# Patient Record
Sex: Male | Born: 1975
Health system: Southern US, Community
[De-identification: ages and names within clinical notes are randomized; demographics above are authoritative.]

## PROBLEM LIST (undated history)

## (undated) DIAGNOSIS — K449 Diaphragmatic hernia without obstruction or gangrene: Secondary | ICD-10-CM

## (undated) DIAGNOSIS — J45909 Unspecified asthma, uncomplicated: Secondary | ICD-10-CM

## (undated) DIAGNOSIS — L509 Urticaria, unspecified: Secondary | ICD-10-CM

## (undated) DIAGNOSIS — G43909 Migraine, unspecified, not intractable, without status migrainosus: Secondary | ICD-10-CM

## (undated) DIAGNOSIS — G4733 Obstructive sleep apnea (adult) (pediatric): Secondary | ICD-10-CM

## (undated) DIAGNOSIS — E559 Vitamin D deficiency, unspecified: Secondary | ICD-10-CM

## (undated) DIAGNOSIS — M2550 Pain in unspecified joint: Secondary | ICD-10-CM

## (undated) DIAGNOSIS — K582 Mixed irritable bowel syndrome: Secondary | ICD-10-CM

## (undated) DIAGNOSIS — E785 Hyperlipidemia, unspecified: Secondary | ICD-10-CM

## (undated) DIAGNOSIS — G4730 Sleep apnea, unspecified: Secondary | ICD-10-CM

## (undated) DIAGNOSIS — R7303 Prediabetes: Secondary | ICD-10-CM

## (undated) DIAGNOSIS — R002 Palpitations: Secondary | ICD-10-CM

## (undated) DIAGNOSIS — E669 Obesity, unspecified: Secondary | ICD-10-CM

## (undated) DIAGNOSIS — I10 Essential (primary) hypertension: Secondary | ICD-10-CM

## (undated) DIAGNOSIS — K5900 Constipation, unspecified: Secondary | ICD-10-CM

## (undated) DIAGNOSIS — M7989 Other specified soft tissue disorders: Secondary | ICD-10-CM

## (undated) DIAGNOSIS — F419 Anxiety disorder, unspecified: Secondary | ICD-10-CM

## (undated) DIAGNOSIS — K219 Gastro-esophageal reflux disease without esophagitis: Secondary | ICD-10-CM

## (undated) DIAGNOSIS — T7840XA Allergy, unspecified, initial encounter: Secondary | ICD-10-CM

## (undated) HISTORY — DX: Gastro-esophageal reflux disease without esophagitis: K21.9

## (undated) HISTORY — DX: Sleep apnea, unspecified: G47.30

## (undated) HISTORY — DX: Mixed irritable bowel syndrome: K58.2

## (undated) HISTORY — DX: Obstructive sleep apnea (adult) (pediatric): G47.33

## (undated) HISTORY — DX: Urticaria, unspecified: L50.9

## (undated) HISTORY — DX: Diaphragmatic hernia without obstruction or gangrene: K44.9

## (undated) HISTORY — DX: Obesity, unspecified: E66.9

## (undated) HISTORY — DX: Pain in unspecified joint: M25.50

## (undated) HISTORY — DX: Anxiety disorder, unspecified: F41.9

## (undated) HISTORY — DX: Allergy, unspecified, initial encounter: T78.40XA

## (undated) HISTORY — DX: Migraine, unspecified, not intractable, without status migrainosus: G43.909

## (undated) HISTORY — DX: Hyperlipidemia, unspecified: E78.5

## (undated) HISTORY — DX: Palpitations: R00.2

## (undated) HISTORY — PX: URETHRAL DILATION: SUR417

## (undated) HISTORY — DX: Constipation, unspecified: K59.00

## (undated) HISTORY — DX: Vitamin D deficiency, unspecified: E55.9

## (undated) HISTORY — DX: Other specified soft tissue disorders: M79.89

## (undated) HISTORY — DX: Unspecified asthma, uncomplicated: J45.909

## (undated) HISTORY — DX: Essential (primary) hypertension: I10

## (undated) HISTORY — DX: Prediabetes: R73.03

---

## 1998-02-20 ENCOUNTER — Emergency Department (HOSPITAL_COMMUNITY): Admission: EM | Admit: 1998-02-20 | Discharge: 1998-02-20 | Payer: Self-pay | Admitting: Internal Medicine

## 1998-10-16 ENCOUNTER — Encounter: Payer: Self-pay | Admitting: Emergency Medicine

## 1998-10-16 ENCOUNTER — Emergency Department (HOSPITAL_COMMUNITY): Admission: EM | Admit: 1998-10-16 | Discharge: 1998-10-16 | Payer: Self-pay | Admitting: Emergency Medicine

## 1999-07-26 ENCOUNTER — Emergency Department (HOSPITAL_COMMUNITY): Admission: EM | Admit: 1999-07-26 | Discharge: 1999-07-26 | Payer: Self-pay | Admitting: Emergency Medicine

## 1999-07-26 ENCOUNTER — Encounter: Payer: Self-pay | Admitting: Emergency Medicine

## 1999-08-13 ENCOUNTER — Ambulatory Visit (HOSPITAL_COMMUNITY): Admission: RE | Admit: 1999-08-13 | Discharge: 1999-08-13 | Payer: Self-pay | Admitting: Urology

## 2000-03-26 ENCOUNTER — Emergency Department (HOSPITAL_COMMUNITY): Admission: EM | Admit: 2000-03-26 | Discharge: 2000-03-26 | Payer: Self-pay | Admitting: Emergency Medicine

## 2000-03-27 ENCOUNTER — Encounter: Payer: Self-pay | Admitting: Emergency Medicine

## 2000-04-06 ENCOUNTER — Encounter: Payer: Self-pay | Admitting: Gastroenterology

## 2000-04-06 ENCOUNTER — Ambulatory Visit (HOSPITAL_COMMUNITY): Admission: RE | Admit: 2000-04-06 | Discharge: 2000-04-06 | Payer: Self-pay | Admitting: Gastroenterology

## 2000-04-08 ENCOUNTER — Encounter (INDEPENDENT_AMBULATORY_CARE_PROVIDER_SITE_OTHER): Payer: Self-pay | Admitting: Specialist

## 2000-04-08 ENCOUNTER — Ambulatory Visit (HOSPITAL_COMMUNITY): Admission: RE | Admit: 2000-04-08 | Discharge: 2000-04-08 | Payer: Self-pay | Admitting: Gastroenterology

## 2000-04-08 ENCOUNTER — Encounter: Payer: Self-pay | Admitting: Gastroenterology

## 2000-04-22 ENCOUNTER — Ambulatory Visit (HOSPITAL_COMMUNITY): Admission: RE | Admit: 2000-04-22 | Discharge: 2000-04-22 | Payer: Self-pay | Admitting: Oncology

## 2000-04-22 ENCOUNTER — Encounter: Payer: Self-pay | Admitting: Oncology

## 2001-10-13 ENCOUNTER — Encounter: Admission: RE | Admit: 2001-10-13 | Discharge: 2001-10-13 | Payer: Self-pay | Admitting: Family Medicine

## 2001-11-25 ENCOUNTER — Encounter: Payer: Self-pay | Admitting: *Deleted

## 2001-11-25 ENCOUNTER — Encounter: Admission: RE | Admit: 2001-11-25 | Discharge: 2001-11-25 | Payer: Self-pay | Admitting: *Deleted

## 2001-11-25 ENCOUNTER — Encounter: Admission: RE | Admit: 2001-11-25 | Discharge: 2001-11-25 | Payer: Self-pay | Admitting: Family Medicine

## 2001-12-02 ENCOUNTER — Encounter: Admission: RE | Admit: 2001-12-02 | Discharge: 2001-12-02 | Payer: Self-pay | Admitting: Family Medicine

## 2002-01-16 ENCOUNTER — Encounter: Admission: RE | Admit: 2002-01-16 | Discharge: 2002-01-16 | Payer: Self-pay | Admitting: Family Medicine

## 2002-03-15 ENCOUNTER — Encounter: Admission: RE | Admit: 2002-03-15 | Discharge: 2002-03-15 | Payer: Self-pay | Admitting: Family Medicine

## 2003-04-10 ENCOUNTER — Encounter: Payer: Self-pay | Admitting: Emergency Medicine

## 2003-04-10 ENCOUNTER — Emergency Department (HOSPITAL_COMMUNITY): Admission: EM | Admit: 2003-04-10 | Discharge: 2003-04-10 | Payer: Self-pay | Admitting: Emergency Medicine

## 2003-08-16 ENCOUNTER — Encounter: Admission: RE | Admit: 2003-08-16 | Discharge: 2003-08-16 | Payer: Self-pay | Admitting: Geriatric Medicine

## 2003-11-09 ENCOUNTER — Ambulatory Visit (HOSPITAL_COMMUNITY): Admission: RE | Admit: 2003-11-09 | Discharge: 2003-11-09 | Payer: Self-pay | Admitting: Internal Medicine

## 2004-12-23 ENCOUNTER — Emergency Department (HOSPITAL_COMMUNITY): Admission: EM | Admit: 2004-12-23 | Discharge: 2004-12-23 | Payer: Self-pay | Admitting: Family Medicine

## 2005-02-04 ENCOUNTER — Emergency Department (HOSPITAL_COMMUNITY): Admission: EM | Admit: 2005-02-04 | Discharge: 2005-02-04 | Payer: Self-pay | Admitting: Family Medicine

## 2005-07-22 ENCOUNTER — Ambulatory Visit: Payer: Self-pay | Admitting: Internal Medicine

## 2005-08-27 ENCOUNTER — Ambulatory Visit: Payer: Self-pay | Admitting: Internal Medicine

## 2006-01-19 ENCOUNTER — Ambulatory Visit: Payer: Self-pay | Admitting: Internal Medicine

## 2006-07-06 ENCOUNTER — Emergency Department (HOSPITAL_COMMUNITY): Admission: EM | Admit: 2006-07-06 | Discharge: 2006-07-06 | Payer: Self-pay | Admitting: Family Medicine

## 2007-03-08 ENCOUNTER — Emergency Department (HOSPITAL_COMMUNITY): Admission: EM | Admit: 2007-03-08 | Discharge: 2007-03-08 | Payer: Self-pay | Admitting: Family Medicine

## 2007-09-28 ENCOUNTER — Emergency Department (HOSPITAL_COMMUNITY): Admission: EM | Admit: 2007-09-28 | Discharge: 2007-09-28 | Payer: Self-pay | Admitting: Emergency Medicine

## 2008-03-05 DIAGNOSIS — K5289 Other specified noninfective gastroenteritis and colitis: Secondary | ICD-10-CM | POA: Insufficient documentation

## 2008-03-06 ENCOUNTER — Ambulatory Visit: Payer: Self-pay | Admitting: Internal Medicine

## 2008-03-07 ENCOUNTER — Telehealth (INDEPENDENT_AMBULATORY_CARE_PROVIDER_SITE_OTHER): Payer: Self-pay | Admitting: *Deleted

## 2008-03-11 DIAGNOSIS — E785 Hyperlipidemia, unspecified: Secondary | ICD-10-CM

## 2008-03-11 DIAGNOSIS — J45909 Unspecified asthma, uncomplicated: Secondary | ICD-10-CM

## 2008-03-11 HISTORY — DX: Unspecified asthma, uncomplicated: J45.909

## 2008-03-11 HISTORY — DX: Hyperlipidemia, unspecified: E78.5

## 2008-04-05 ENCOUNTER — Ambulatory Visit: Payer: Self-pay | Admitting: Internal Medicine

## 2008-04-06 ENCOUNTER — Telehealth (INDEPENDENT_AMBULATORY_CARE_PROVIDER_SITE_OTHER): Payer: Self-pay | Admitting: *Deleted

## 2008-08-21 ENCOUNTER — Ambulatory Visit: Payer: Self-pay | Admitting: Internal Medicine

## 2008-10-22 ENCOUNTER — Telehealth: Payer: Self-pay | Admitting: Internal Medicine

## 2008-11-19 ENCOUNTER — Ambulatory Visit: Payer: Self-pay | Admitting: Internal Medicine

## 2008-11-19 DIAGNOSIS — G4733 Obstructive sleep apnea (adult) (pediatric): Secondary | ICD-10-CM

## 2008-11-19 HISTORY — DX: Obstructive sleep apnea (adult) (pediatric): G47.33

## 2008-12-14 ENCOUNTER — Ambulatory Visit (HOSPITAL_BASED_OUTPATIENT_CLINIC_OR_DEPARTMENT_OTHER): Admission: RE | Admit: 2008-12-14 | Discharge: 2008-12-14 | Payer: Self-pay | Admitting: Internal Medicine

## 2008-12-14 ENCOUNTER — Encounter: Payer: Self-pay | Admitting: Internal Medicine

## 2008-12-22 ENCOUNTER — Ambulatory Visit: Payer: Self-pay | Admitting: Internal Medicine

## 2008-12-24 ENCOUNTER — Ambulatory Visit: Payer: Self-pay | Admitting: Internal Medicine

## 2008-12-28 ENCOUNTER — Encounter: Payer: Self-pay | Admitting: Internal Medicine

## 2009-01-09 ENCOUNTER — Telehealth: Payer: Self-pay | Admitting: Internal Medicine

## 2009-03-15 ENCOUNTER — Ambulatory Visit: Payer: Self-pay | Admitting: Internal Medicine

## 2009-07-04 ENCOUNTER — Telehealth (INDEPENDENT_AMBULATORY_CARE_PROVIDER_SITE_OTHER): Payer: Self-pay | Admitting: *Deleted

## 2009-07-05 ENCOUNTER — Emergency Department (HOSPITAL_COMMUNITY): Admission: EM | Admit: 2009-07-05 | Discharge: 2009-07-05 | Payer: Self-pay | Admitting: Family Medicine

## 2009-07-25 ENCOUNTER — Ambulatory Visit: Payer: Self-pay | Admitting: Internal Medicine

## 2009-07-25 DIAGNOSIS — R5381 Other malaise: Secondary | ICD-10-CM | POA: Insufficient documentation

## 2009-07-25 DIAGNOSIS — R5383 Other fatigue: Secondary | ICD-10-CM

## 2009-07-25 DIAGNOSIS — R42 Dizziness and giddiness: Secondary | ICD-10-CM | POA: Insufficient documentation

## 2009-08-03 LAB — CONVERTED CEMR LAB
ALT: 39 units/L (ref 0–53)
AST: 25 units/L (ref 0–37)
BUN: 7 mg/dL (ref 6–23)
Basophils Absolute: 0 10*3/uL (ref 0.0–0.1)
Basophils Relative: 0.5 % (ref 0.0–3.0)
CO2: 30 meq/L (ref 19–32)
Calcium: 9.5 mg/dL (ref 8.4–10.5)
Chloride: 101 meq/L (ref 96–112)
Creatinine, Ser: 0.9 mg/dL (ref 0.4–1.5)
Eosinophils Absolute: 0.1 10*3/uL (ref 0.0–0.7)
Eosinophils Relative: 1.3 % (ref 0.0–5.0)
Free T4: 0.7 ng/dL (ref 0.6–1.6)
GFR calc non Af Amer: 101.76 mL/min (ref >60)
Glucose, Bld: 85 mg/dL (ref 70–99)
HCT: 45.5 % (ref 39.0–52.0)
Hemoglobin: 15.3 g/dL (ref 13.0–17.0)
Lymphocytes Relative: 32.6 % (ref 12.0–46.0)
Lymphs Abs: 2.1 10*3/uL (ref 0.7–4.0)
MCHC: 33.7 g/dL (ref 30.0–36.0)
MCV: 83 fL (ref 78.0–100.0)
Monocytes Absolute: 0.6 10*3/uL (ref 0.1–1.0)
Monocytes Relative: 9.7 % (ref 3.0–12.0)
Neutro Abs: 3.7 10*3/uL (ref 1.4–7.7)
Neutrophils Relative %: 55.9 % (ref 43.0–77.0)
Platelets: 252 10*3/uL (ref 150.0–400.0)
Potassium: 4.1 meq/L (ref 3.5–5.1)
RBC: 5.48 M/uL (ref 4.22–5.81)
RDW: 13.5 % (ref 11.5–14.6)
Sodium: 140 meq/L (ref 135–145)
TSH: 1.69 microintl units/mL (ref 0.35–5.50)
WBC: 6.5 10*3/uL (ref 4.5–10.5)

## 2009-09-26 ENCOUNTER — Ambulatory Visit (HOSPITAL_COMMUNITY): Admission: RE | Admit: 2009-09-26 | Discharge: 2009-09-26 | Payer: Self-pay | Admitting: Orthopaedic Surgery

## 2009-10-11 ENCOUNTER — Ambulatory Visit: Payer: Self-pay | Admitting: Internal Medicine

## 2009-11-18 ENCOUNTER — Telehealth: Payer: Self-pay | Admitting: Internal Medicine

## 2010-03-31 ENCOUNTER — Telehealth: Payer: Self-pay | Admitting: Internal Medicine

## 2010-07-11 ENCOUNTER — Telehealth (INDEPENDENT_AMBULATORY_CARE_PROVIDER_SITE_OTHER): Payer: Self-pay | Admitting: *Deleted

## 2010-10-10 ENCOUNTER — Ambulatory Visit: Admit: 2010-10-10 | Payer: Self-pay | Admitting: Internal Medicine

## 2010-10-14 NOTE — Assessment & Plan Note (Signed)
Summary: 6 monthsd/apc   Primary Provider/Referring Provider:  Rockie Neighbours  CC:  6 month follow up visit-eyes are dry and burning; nasal drainage-bloody x 2 weeks.Marland Kitchen  History of Present Illness: 11/19/08- Asthmatic bronchitis, allergic rhinitis, GERD                     Wife here For past 4 days has had sore throat, nasal drainage, clear mucus. dry cough. Denies earache, headache, fever sweats or chills, nausea or diarrhea. Woke with some sinus infection, sore throat. Wife says he still keeps a throat clearing cough. He feels current meds are good enough, using rescue inhaler about 2x/week.  She notes he will toss and turn at night without much snoring. She bumps him to see if he is breathing. He admits tired in day and feels unrested.  Asthmatic bronchitis, allergic rhinitis, OSA  Reviewed his sleep study with him. NPSG confirmed moderate OSA 20.8/hr. CPAP titration was complicated by apparent nasal congestion at higher pressures. We discussed cpap and compared alternative treatments.  03/15/09- Asthmatic bronchitis, allergic rhinitis, GERD CPAP is set at 12.- Advanced- full face mask. He feels better witout problems, and wife tells him he is sleeping a lot better. Allergy/ nasal congestion is much better with passing of Spring, but also after his work area was renovated and carpet replaced with tile.  October 11, 2009- Asthmatic bronchitis, allergic rhjinitis, GERD For 2 weeks eyes burning and watering. Nasal discharge  a little blood but no headache, fever or sore throat. Ears ok. he is able to remain compliant with CPAP, using it every night. Chest feels better, with no wheeze or cough. Used albuterol twice this month.     Current Medications (verified): 1)  Nexium 40 Mg  Cpdr (Esomeprazole Magnesium) .Marland Kitchen.. 1 Daily Before Meal 2)  Advair Diskus 250-50 Mcg/dose Misc (Fluticasone-Salmeterol) .Marland Kitchen.. 1 Puff and Rinse, Twice Daily 3)  Proventil Hfa 108 (90 Base) Mcg/act Aers (Albuterol Sulfate) ....  2 Puffs Four Times A Day As Needed 4)  Fluticasone Propionate 50 Mcg/act Susp (Fluticasone Propionate) .Marland Kitchen.. 1-2 Nsprays Each Nostril Daily 5)  Cpap 12  Advanced 6)  Lodrane 24d 12-90 Mg Xr24h-Cap (Brompheniramine-Pseudoeph) .... Take 1 or 2 Tablets By Mouth Once Daily 7)  Meclizine Hcl 25 Mg Tabs (Meclizine Hcl) .Marland Kitchen.. 1 Q 6-8 Hrs As Needed Dizziness 8)  Benadryl 25 Mg Tabs (Diphenhydramine Hcl) .... Take 2 By Mouth At Bedtime For Allergies and Sleep  Allergies (verified): No Known Drug Allergies  Past History:  Past Medical History: Last updated: 12/24/2008 Allergic Rhinitis Asthma urticaria Hyperlipidemia Hypertension Obstrucitve Sleep Apnea-  12/14/08- AHI 20.8/ hr; CPAP 12/ AHI 0  Past Surgical History: Last updated: 11/19/2008 Dilatation of urethral stricture  Family History: Last updated: 03/06/2008 Family History Asthma, cancer, allergies  Social History: Last updated: 03/06/2008 Patient never smoked.  Medical records married  Risk Factors: Smoking Status: never (03/06/2008)  Review of Systems      See HPI  The patient denies anorexia, fever, weight loss, weight gain, vision loss, decreased hearing, hoarseness, chest pain, syncope, dyspnea on exertion, peripheral edema, prolonged cough, headaches, hemoptysis, and severe indigestion/heartburn.    Vital Signs:  Patient profile:   35 year old male Height:      70 inches Weight:      222.38 pounds BMI:     32.02 O2 Sat:      97 % on Room air Pulse rate:   96 / minute BP sitting:   142 /  90  (left arm) Cuff size:   regular  Vitals Entered By: Reynaldo Minium CMA (October 11, 2009 1:39 PM)  O2 Flow:  Room air  Physical Exam  Additional Exam:  General: A/Ox3; pleasant and cooperative, NAD, overweight, comfortable appearing SKIN: no rash, lesions NODES: no lymphadenopathy HEENT: [place]/AT, EOM- WNL, Conjuctivae- clear, PERRLA, mild periorbital edema, TM-WNL, Nose- mild turbinate edema, Throat- clear and wnl,  Melampatti III, prominent tonsils NECK: Supple w/ fair ROM, JVD- none, normal carotid impulses w/o bruits Thyroid- normal to palpation CHEST: Clear to P&A HEART: RRR, no m/g/r heard ABDOMEN: Soft and nl; ZOX:WRUE, nl pulses, no edema  NEURO: Grossly intact to observation      Impression & Recommendations:  Problem # 1:  SLEEP APNEA (ICD-780.57)  Good compliance and control. He will continue CPAP. Weight loss would help.  Problem # 2:  ALLERGIC RHINITIS (ICD-477.9)  Mild rhinoconjunctivitis. We discussed potential overdrying in winter heat and the effect of nasal steroids. We will try otc eye drops. His updated medication list for this problem includes:    Fluticasone Propionate 50 Mcg/act Susp (Fluticasone propionate) .Marland Kitchen... 1-2 nsprays each nostril daily    Benadryl 25 Mg Tabs (Diphenhydramine hcl) .Marland Kitchen... Take 2 by mouth at bedtime for allergies and sleep  Medications Added to Medication List This Visit: 1)  Benadryl 25 Mg Tabs (Diphenhydramine hcl) .... Take 2 by mouth at bedtime for allergies and sleep  Other Orders: Est. Patient Level II (45409)  Patient Instructions: 1)  Schedule return in one year, earlier if needed 2)  Try otc eye drops- either Visine AC or Naphcon-A. 3)  continue CPAP at 12

## 2010-10-14 NOTE — Progress Notes (Signed)
Summary: tight chest/ cough     Phone Note Call from Patient   Caller: Patient Call For: Falecia Vannatter Summary of Call: pt c/o tight chest w/ non-productive cough x 3 days. has been taking OTC advill cold as well as vick's vapo rub. denies fever. no N or V. mose cone out pt pharm. pt # [phone]    Initial call taken by: Tivis Ringer, CNA,  November 18, 2009 10:12 AM  Follow-up for Phone Call        Pt c/o chest tightness and non prod cough for 3 days.  Pt taking Advair two times a day and proventil as needed .  Denies fever.  Chest feels like it is burnung.  Please advise.  Abigail Miyamoto RN  November 18, 2009 10:22 AM   Additional Follow-up for Phone Call Additional follow up Details #1::        This could be viral bronchitis or allergy. I don't think antibiotic would do much. Offer prednisone 20 mg, # 5, one daily. Additional Follow-up by: Waymon Budge MD,  November 18, 2009 5:14 PM    Additional Follow-up for Phone Call Additional follow up Details #2::    [email or web address] Randell Loop CMA  November 18, 2009 5:21 PM   returned phone call, pt [phone]. Darletta Moll  November 19, 2009 9:34 AM  [email or web address].  Aundra Millet Reynolds LPN  November 20, 979 10:06 AM   pt advised and rx sent. Carron Curie CMA  November 19, 2009 10:09 AM   New/Updated Medications: PREDNISONE 20 MG TABS (PREDNISONE) Take 1 tablet by mouth once a day Prescriptions: PREDNISONE 20 MG TABS (PREDNISONE) Take 1 tablet by mouth once a day  #5 x 0   Entered by:   Carron Curie CMA   Authorized by:   Waymon Budge MD   Signed by:   Carron Curie CMA on 11/19/2009   Method used:   Electronically to        [facility]* (retail)       [address]       [address] Shipping/mailing       [place], [place]  [address]       Ph: [phone]       Fax: [phone]   RxID:   [id number]

## 2010-10-14 NOTE — Progress Notes (Signed)
Summary: lodrane discontinued > try loratadine D 24  Phone Note Call from Patient Call back at Work Phone [phone]   Caller: Patient Call For: young Reason for Call: Refill Medication Summary of Call: Pt states that his pharmacy says that lodrane has been discontinued, gave him the last 20 tabs, needs a substitute, pls advise.//[facility] Initial call taken by: Darletta Moll,  July 11, 2010 11:39 AM  Follow-up for Phone Call        Attempted to call patients work number in system-wrong number so I called the home number and left message for patient to call the office- Need to let patient know that CDY is out of the office until Monday and will have him answer this message then-in the meantime continue to use the tablets the pharmacy gave him.Reynaldo Minium CMA  July 11, 2010 2:04 PM   pt returned call.  advised of recs stated by Florentina Addison above.  pt verbalized his understanding.  will forward to CDY.  please call pt monday @ [phone]. Boone Master CNA/MA  July 11, 2010 4:41 PM   Additional Follow-up for Phone Call Additional follow up Details #1::        Try signing at pharmacy counter for loratadine D 24. Additional Follow-up by: Waymon Budge MD,  July 11, 2010 8:43 PM    Additional Follow-up for Phone Call Additional follow up Details #2::    called spoke with patient, advised of CDY's recs as stated above.  pt verbalized his understanding. Boone Master CNA/MA  July 14, 2010 9:27 AM

## 2010-10-14 NOTE — Progress Notes (Signed)
Summary: emerdengy inhaler use   [id number]  Phone Note Call from Patient Call back at [phone]   Caller: Patient Call For: young Reason for Call: Talk to Nurse Summary of Call: have not had to use Emergency Inhaler in over a month until Thursday when he has had to use it 2-3 times per day.  Please advise.  Emergency Inhaler is Ventolin. Initial call taken by: Eugene Gavia,  March 31, 2010 10:23 AM  Follow-up for Phone Call        Spoke with pt.  He c/o increased SOB x 5 days, states that he feels as if he is unable to take in a deep enough breath.  He also states that chest feels "tight and heavy"- he is using ventolin 2-3 times per day since symptoms started and this "helps only some"- no openings in the schedule until 04/04/10.  Wants to be seen sooner.  Pls advise thanks Follow-up by: Vernie Murders,  March 31, 2010 11:11 AM  Additional Follow-up for Phone Call Additional follow up Details #1::        Have pt pick up Symbicort 160 2 puffs and rinse two times a day until apt on 7-22.Reynaldo Minium CMA  March 31, 2010 12:27 PM   Sample at front.    Additional Follow-up for Phone Call Additional follow up Details #2::    [email or web address].  Aundra Millet Reynolds LPN  March 31, 2010 1:21 PM    pt returned call and he is aware of symbicort sample up front and instructions to use until next ov on 7-22 Randell Loop CMA  March 31, 2010 1:42 PM

## 2010-11-10 ENCOUNTER — Ambulatory Visit (INDEPENDENT_AMBULATORY_CARE_PROVIDER_SITE_OTHER): Payer: Self-pay | Admitting: Internal Medicine

## 2010-11-10 ENCOUNTER — Encounter: Payer: Self-pay | Admitting: Internal Medicine

## 2010-11-10 DIAGNOSIS — J45909 Unspecified asthma, uncomplicated: Secondary | ICD-10-CM

## 2010-11-10 DIAGNOSIS — G4730 Sleep apnea, unspecified: Secondary | ICD-10-CM

## 2010-11-10 DIAGNOSIS — J309 Allergic rhinitis, unspecified: Secondary | ICD-10-CM

## 2010-11-10 DIAGNOSIS — L509 Urticaria, unspecified: Secondary | ICD-10-CM

## 2010-11-20 NOTE — Assessment & Plan Note (Signed)
Summary: 1 YEAR ROV//SH   Primary Provider/Referring Provider:  Rockie Neighbours  CC:  Yearly follow up visit-allergies and OSA.  History of Present Illness: 03/15/09- Asthmatic bronchitis, allergic rhinitis, GERD CPAP is set at 12.- Advanced- full face mask. He feels better witout problems, and wife tells him he is sleeping a lot better. Allergy/ nasal congestion is much better with passing of Spring, but also after his work area was renovated and carpet replaced with tile.  October 11, 2009- Asthmatic bronchitis, allergic rhjinitis, GERD For 2 weeks eyes burning and watering. Nasal discharge  a little blood but no headache, fever or sore throat. Ears ok. he is able to remain compliant with CPAP, using it every night. Chest feels better, with no wheeze or cough. Used albuterol twice this month.  November 10, 2010- Asthmatic bronchitis, allergic rhjinitis, urticaria, GERD Nurse-CC: Yearly follow up visit-allergies and OSA      Past hx allergy vaccine  CPAP 12- uses all night every night and says it makes life a lot better. No concerns voiced.  Urticaria- Zyrtec doesn't hold hives as well as Lodrane did. Rhinitis usually worst in Spring and he is feeling a little more congested, eyes burning. Using OTC eyedrops.  Asthma-Using rescue inhaler 1-2x / month.    Asthma History    Initial Asthma Severity Rating:    Age range: 12+ years    Symptoms: 0-2 days/week    Nighttime Awakenings: 0-2/month    Interferes w/ normal activity: no limitations    SABA use (not for EIB): 0-2 days/week    Asthma Severity Assessment: Intermittent   Preventive Screening-Counseling & Management  Alcohol-Tobacco     Smoking Status: never  Current Medications (verified): 1)  Nexium 40 Mg  Cpdr (Esomeprazole Magnesium) .Marland Kitchen.. 1 Daily Before Meal 2)  Advair Diskus 250-50 Mcg/dose Misc (Fluticasone-Salmeterol) .Marland Kitchen.. 1 Puff and Rinse, Twice Daily 3)  Proventil Hfa 108 (90 Base) Mcg/act Aers (Albuterol Sulfate) .... 2  Puffs Four Times A Day As Needed 4)  Fluticasone Propionate 50 Mcg/act Susp (Fluticasone Propionate) .Marland Kitchen.. 1-2 Nsprays Each Nostril Daily 5)  Cpap 12  Advanced 6)  Lodrane 24d 12-90 Mg Xr24h-Cap (Brompheniramine-Pseudoeph) .... Take 1 or 2 Tablets By Mouth Once Daily 7)  Meclizine Hcl 25 Mg Tabs (Meclizine Hcl) .Marland Kitchen.. 1 Q 6-8 Hrs As Needed Dizziness 8)  Benadryl 25 Mg Tabs (Diphenhydramine Hcl) .... Take 2 By Mouth At Bedtime For Allergies and Sleep 9)  Prednisone 20 Mg Tabs (Prednisone) .... Take 1 Tablet By Mouth Once A Day  Allergies (verified): No Known Drug Allergies  Past History:  Past Medical History: Last updated: 12/24/2008 Allergic Rhinitis Asthma urticaria Hyperlipidemia Hypertension Obstrucitve Sleep Apnea-  12/14/08- AHI 20.8/ hr; CPAP 12/ AHI 0  Past Surgical History: Last updated: 11/19/2008 Dilatation of urethral stricture  Family History: Last updated: 03/06/2008 Family History Asthma, cancer, allergies  Social History: Last updated: 03/06/2008 Patient never smoked.  Medical records married  Risk Factors: Smoking Status: never (11/10/2010)  Review of Systems      See HPI       The patient complains of nasal congestion/difficulty breathing through nose and sneezing.  The patient denies shortness of breath with activity, shortness of breath at rest, productive cough, non-productive cough, coughing up blood, chest pain, irregular heartbeats, acid heartburn, indigestion, loss of appetite, weight change, abdominal pain, difficulty swallowing, sore throat, tooth/dental problems, headaches, ear ache, rash, change in color of mucus, and fever.    Vital Signs:  Patient profile:  35 year old male Height:      70 inches Weight:      220.13 pounds BMI:     31.70 O2 Sat:      97 % on Room air Pulse rate:   94 / minute BP sitting:   116 / 80  (left arm) Cuff size:   regular  Vitals Entered By: Reynaldo Minium CMA (November 10, 2010 4:08 PM)  O2 Flow:  Room  air CC: Yearly follow up visit-allergies and OSA   Physical Exam  Additional Exam:  General: A/Ox3; pleasant and cooperative, NAD, overweight, comfortable appearing SKIN: scratch marks onleft upper arm NODES: no lymphadenopathy HEENT: [place]/AT, EOM- WNL, Conjuctivae- clear, PERRLA, mild periorbital edema, TM-WNL, Nose- mild turbinate edema, red mucosa, Throat- clear and wnl, Mallamopati III, prominent tonsils NECK: Supple w/ fair ROM, JVD- none, normal carotid impulses w/o bruits Thyroid- normal to palpation CHEST: Clear to P&A HEART: RRR, no m/g/r heard ABDOMEN: Soft and nl; UEA:VWUJ, nl pulses, no edema  NEURO: Grossly intact to observation      Impression & Recommendations:  Problem # 1:  SLEEP APNEA (ICD-780.57)  Good compliance and control with CPAP. Pressure is appropriate.   Problem # 2:  ALLERGIC RHINITIS (ICD-477.9)  Some seasonal worsening. We will be advising trial of Allegra His updated medication list for this problem includes:    Fluticasone Propionate 50 Mcg/act Susp (Fluticasone propionate) .Marland Kitchen... 1-2 nsprays each nostril daily    Benadryl 25 Mg Tabs (Diphenhydramine hcl) .Marland Kitchen... Take 2 by mouth at bedtime for allergies and sleep    Allegra Allergy 180 Mg Tabs (Fexofenadine hcl) .Marland Kitchen... 1 daily  Problem # 3:  URTICARIA (ICD-708.9)  Describes mild chronic intermittent urticaria. We will try allegra but he may need doxepin  Problem # 4:  ASTHMATIC BRONCHITIS, ACUTE (ICD-466.0) So far, current meds are adequate.  The following medications were removed from the medication list:    Lodrane 24d 12-90 Mg Xr24h-cap (Brompheniramine-pseudoeph) .Marland Kitchen... Take 1 or 2 tablets by mouth once daily His updated medication list for this problem includes:    Advair Diskus 250-50 Mcg/dose Misc (Fluticasone-salmeterol) .Marland Kitchen... 1 puff and rinse, twice daily    Proventil Hfa 108 (90 Base) Mcg/act Aers (Albuterol sulfate) .Marland Kitchen... 2 puffs four times a day as needed  Medications Added to  Medication List This Visit: 1)  Allegra Allergy 180 Mg Tabs (Fexofenadine hcl) .Marland Kitchen.. 1 daily  Other Orders: Est. Patient Level III (81191)  Patient Instructions: 1)  Please schedule a follow-up appointment in 2 months. 2)  Try otc Allegra 180/  fexofenadine 180 as an antihistamine instead of Zyrtec for trail 3)  If needed, you can add a decongestant like Sudafed- sign at counter, or phenylephrine- many otc products on the shelf 4)  Continue CPAP at 12 5)  Meds refilled Prescriptions: FLUTICASONE PROPIONATE 50 MCG/ACT SUSP (FLUTICASONE PROPIONATE) 1-2 nsprays each nostril daily  #16 Each x 4   Entered and Authorized by:   Waymon Budge MD   Signed by:   Waymon Budge MD on 11/10/2010   Method used:   Print then Give to Patient   RxID:   [id number] PROVENTIL HFA 108 (90 BASE) MCG/ACT AERS (ALBUTEROL SULFATE) 2 puffs four times a day as needed  #1 x 3   Entered and Authorized by:   Waymon Budge MD   Signed by:   Waymon Budge MD on 11/10/2010   Method used:   Print then Give to Patient   RxID:  [id number] ADVAIR DISKUS 250-50 MCG/DOSE MISC (FLUTICASONE-SALMETEROL) 1 puff and rinse, twice daily  #1 x 11   Entered and Authorized by:   Waymon Budge MD   Signed by:   Waymon Budge MD on 11/10/2010   Method used:   Print then Give to Patient   RxID:   [id number]

## 2011-01-07 ENCOUNTER — Encounter: Payer: Self-pay | Admitting: Internal Medicine

## 2011-01-09 ENCOUNTER — Ambulatory Visit: Payer: Self-pay | Admitting: Internal Medicine

## 2011-01-13 ENCOUNTER — Encounter: Payer: Self-pay | Admitting: Internal Medicine

## 2011-01-14 ENCOUNTER — Encounter: Payer: Self-pay | Admitting: Internal Medicine

## 2011-01-14 DIAGNOSIS — Z0289 Encounter for other administrative examinations: Secondary | ICD-10-CM

## 2011-01-16 ENCOUNTER — Encounter: Payer: Self-pay | Admitting: Internal Medicine

## 2011-01-27 NOTE — Procedures (Signed)
[name], [name] NO.:  [id number]   MEDICAL RECORD NO.:  [id number]          PATIENT TYPE:  OUT   LOCATION:  SLEEP CENTER                 FACILITY:  [facility]   PHYSICIAN:  Clinton D. Maple Hudson, MD, FCCP, FACPDATE OF BIRTH:  07-Jul-1974   DATE OF STUDY:  12/14/2008                            NOCTURNAL POLYSOMNOGRAM   REFERRING PHYSICIAN:  Clinton D. Young, MD, FCCP, FACP   INDICATION FOR STUDY:  Hypersomnia with sleep apnea.  Epworth sleepiness  score 13/24, BMI of 30.1, weight 210 pounds, height 70 inches, and neck  17 inches.  Home medication charted and reviewed.   SLEEP ARCHITECTURE:  Split study protocol.  During the diagnostic phase,  total sleep time 132 minutes with sleep efficiency of 49.2%.  Stage I  was 17.4%, stage II 58.1%, stage III 5.7%, and REM 18.9% of total sleep  time.  Sleep latency 72.5 minutes, REM latency 143 minutes, awake after  sleep onset 66 minutes, arousal index 33.1.  No bedtime medication was  reported.   RESPIRATORY DATA:  Split study protocol.  During the diagnostic phase  apnea/hypopnea index (AHI) 20.8 per hour.  A total of 46 events was  scored including 10 obstructive apneas and 36 hypopneas.  Events were  nonpositional, but more common while supine.  REM AHI 43.2.  CPAP was  then titrated to optimum control at 12 CWP, AHI 0 per hour.  He wore a  medium full-face Quattro mask with heated humidifier.  The technician  took him as high as 18 CWP with diminishing control likely reflecting  progressive nasal congestion at higher air flows.   OXYGEN DATA:  Moderate snoring before CPAP with oxygen desaturation to a  nadir of 84%.  After CPAP control, snoring was prevented and mean oxygen  saturation held 95.1% on room air.   CARDIAC DATA:  Normal sinus rhythm.   MOVEMENT/PARASOMNIA:  No significant movement disturbance.  Bathroom x1.   IMPRESSION/RECOMMENDATIONS:  1. Moderate obstructive sleep apnea/hypopnea syndrome, AHI 20.8  per      hour.  Events were not positional, but more common while supine.      Moderate snoring with oxygen desaturation to a nadir of 84% on room      air.  2. Continuous positive airway pressure was titrated to best initial      pressure for home trial of 12 CWP, AHI 0 per hour.  He wore a      medium full-face Quattro mask with heated humidifier.  The      technician had taken the      pressure higher trying to suppress central events and hypopneas,      but with diminishing benefit apparently reflecting increasing nasal      congestion.      Clinton D. Maple Hudson, MD, [facility], FACP  Diplomate, Biomedical engineer of Sleep Medicine  Electronically Signed     CDY/MEDQ  D:  12/22/2008 10:55:30  T:  12/22/2008 22:48:19  Job:  [id number]

## 2011-01-30 NOTE — Procedures (Signed)
[place]  Patient:    Sergio [name], Sergio [name]                      MRN: [id number] Proc. Date: 04/08/00 Adm. Date:  [id number] Attending:  Nelda Marseille CC:         Monica Becton, M.D.                           Procedure Report  NAME OF PROCEDURE:  Colonoscopy with biopsy.  INDICATIONS:  Multiple GI complaints with abnormal CT scan worrisome for colitis.  Consent was signed after risks, benefits, methods and options were thoroughly discussed prior to any premedications given.  MEDICATIONS:  Demerol 70, Versed 8.  PROCEDURE:  The rectal inspection is pertinent for external hemorrhoids.  The digital examination was negative.  The video colonoscope was inserted and easily advanced around the colon to the cecum.  This did require rolling him on his back and some abdominal pressure.  No signs of colitis or abnormalities were seen on insertion.  The scope was inserted short ways in the terminal ileum which was normal.  Scattered biopsies were obtained.  Photo documentation was obtained.  The scope was slowly withdrawn.  The prep was adequate.  There was some liquid stool that required washing and suctioning, but overall, the prep was good.   On slow withdrawal through the colon, random biopsies were obtained with the exception of a tiny patch of sigmoid inflammation which may have been due to scope trauma which was biopsied and put in a third container as well as distal sigmoid and rectal biopsies.  No other abnormalities were seen as we withdrew back to the rectum.  Once back in the rectum, the scope was retroflexed revealing some internal hemorrhoids.  The scope was drained and readvanced a short way up the sigmoid.  Air was suctioned, scope was removed.  The patient tolerated the procedure well.  There was no obvious immediate complication.  ENDOSCOPIC DIAGNOSES: 1. Internal and external hemorrhoids small. 2. One tiny patch of colitis in  the sigmoid possibly scope trauma    status post biopsy put in the third container. 3. Otherwise, within normal limits to the terminal ileum.  Status post    random biopsy.  PLAN: 1. Trial of Librax. 2. Consideration of continuing work up with gallbladder ultrasound    or small bowel follow-through. 3. Will call p.r.n. and check on him with the biopsies.  Will call or    be seen sooner p.r.n. and otherwise follow-up in one month in [place]. DD:  04/08/00 TD:  04/09/00 Job: [id number]

## 2012-05-06 ENCOUNTER — Ambulatory Visit (INDEPENDENT_AMBULATORY_CARE_PROVIDER_SITE_OTHER): Payer: 59 | Admitting: Internal Medicine

## 2012-05-06 ENCOUNTER — Encounter: Payer: Self-pay | Admitting: Internal Medicine

## 2012-05-06 VITALS — BP 121/92 | HR 83 | Temp 97.3°F | Wt 224.4 lb

## 2012-05-06 DIAGNOSIS — Z23 Encounter for immunization: Secondary | ICD-10-CM

## 2012-05-06 DIAGNOSIS — K582 Mixed irritable bowel syndrome: Secondary | ICD-10-CM | POA: Insufficient documentation

## 2012-05-06 DIAGNOSIS — K219 Gastro-esophageal reflux disease without esophagitis: Secondary | ICD-10-CM

## 2012-05-06 DIAGNOSIS — E785 Hyperlipidemia, unspecified: Secondary | ICD-10-CM

## 2012-05-06 DIAGNOSIS — G4730 Sleep apnea, unspecified: Secondary | ICD-10-CM

## 2012-05-06 DIAGNOSIS — J45909 Unspecified asthma, uncomplicated: Secondary | ICD-10-CM

## 2012-05-06 DIAGNOSIS — K5900 Constipation, unspecified: Secondary | ICD-10-CM

## 2012-05-06 DIAGNOSIS — R197 Diarrhea, unspecified: Secondary | ICD-10-CM

## 2012-05-06 DIAGNOSIS — I10 Essential (primary) hypertension: Secondary | ICD-10-CM

## 2012-05-06 DIAGNOSIS — K449 Diaphragmatic hernia without obstruction or gangrene: Secondary | ICD-10-CM

## 2012-05-06 DIAGNOSIS — K589 Irritable bowel syndrome without diarrhea: Secondary | ICD-10-CM

## 2012-05-06 HISTORY — DX: Essential (primary) hypertension: I10

## 2012-05-06 HISTORY — DX: Mixed irritable bowel syndrome: K58.2

## 2012-05-06 HISTORY — DX: Gastro-esophageal reflux disease without esophagitis: K21.9

## 2012-05-06 LAB — BASIC METABOLIC PANEL WITH GFR
BUN: 11 mg/dL (ref 6–23)
CO2: 27 mEq/L (ref 19–32)
Calcium: 9.4 mg/dL (ref 8.4–10.5)
Chloride: 102 mEq/L (ref 96–112)
Creat: 1.07 mg/dL (ref 0.50–1.35)
GFR, Est African American: >89 mL/min
GFR, Est Non African American: 88 mL/min
Glucose, Bld: 89 mg/dL (ref 70–99)
Potassium: 4.4 mEq/L (ref 3.5–5.3)
Sodium: 139 mEq/L (ref 135–145)

## 2012-05-06 LAB — LIPID PANEL
Cholesterol: 252 mg/dL — ABNORMAL HIGH (ref 0–200)
HDL: 34 mg/dL — ABNORMAL LOW (ref >39)
LDL Cholesterol: 180 mg/dL — ABNORMAL HIGH (ref 0–99)
Total CHOL/HDL Ratio: 7.4 Ratio
Triglycerides: 192 mg/dL — ABNORMAL HIGH (ref <150)
VLDL: 38 mg/dL (ref 0–40)

## 2012-05-06 MED ORDER — ESOMEPRAZOLE MAGNESIUM 40 MG PO CPDR: 40.0000 mg | DELAYED_RELEASE_CAPSULE | Freq: Every day | ORAL | Status: DC

## 2012-05-06 MED ORDER — ALBUTEROL SULFATE HFA 108 (90 BASE) MCG/ACT IN AERS: 2.0000 | INHALATION_SPRAY | Freq: Four times a day (QID) | RESPIRATORY_TRACT | Status: DC | PRN

## 2012-05-06 NOTE — Assessment & Plan Note (Signed)
As Mr. Alpern carries a diagnosis of hyperlipidemia, which has not been assessed in the last several years, we will obtain a lipid profile today. Technically, at this moment, his goal LDL is less than 160. We will treat his hyperlipidemia appropriately based on the results of today's labs which are pending at this time.

## 2012-05-06 NOTE — Assessment & Plan Note (Signed)
Symptomatically Sergio [name] obstructive sleep apnea is well controlled on the nocturnal CPAP at 12 cm of water. He was asked to continue this therapy on a regular basis and to followup with his pulmonologist Dr. Maple Hudson.

## 2012-05-06 NOTE — Assessment & Plan Note (Signed)
Sergio [name] asthma seems to be well-controlled with aggressive therapy for his allergic rhinitis that includes both diphenhydramine and fenofexidine on an as-needed basis. He is requiring albuterol rescue less than 3 times per month. I am concerned that the chronic cough he has developed may be related to a cough variant asthma given that he has stopped taking his Advair inhaler. Because he has significant and symptomatic reflux disease we are attributing this cough to uncontrolled gastroesophageal reflux disease at this time. If the cough does not resolve with adequate therapy for his reflux disease and control of his allergic rhinitis we will encourage him to restart his Advair inhaler.

## 2012-05-06 NOTE — Assessment & Plan Note (Signed)
Mr. Garden irritable bowel syndrome is reasonably well controlled with diet therapy and specific food avoidance. He was asked to continue to monitor his diet and avoid any foods that may exacerbate his interval bowel syndrome.

## 2012-05-06 NOTE — Assessment & Plan Note (Addendum)
Sergio [name] has not taken his PPI for nearly one year. Over the last 3 months he has noted a return of his reflux symptoms. This also may be exacerbating his underlying asthma or resulting in a chronic cough. Interestingly, when he has an exacerbation of his reflux symptoms he also notes generalized nausea and occasional dizziness. We will therefore restart his esomeprazole at 40 mg by mouth daily. We'll monitor his symptoms of reflux and his cough. If his cough does not resolve within 3 months of daily PPI therapy we will consider further treatment of his underlying asthma.

## 2012-05-06 NOTE — Patient Instructions (Addendum)
It was very nice to meet you today.  I am looking forward to caring for you for years to come.  For the Acid Reflux we start the nexium 40 mg by mouth once a day.  Please let me know if you continue to have reflux symptoms after 2-4 works.  I suspect your nausea is also related to your reflux and should improve with therapy.  For the asthma follow-up with Dr. Maple Hudson.  Continue the as needed albuterol.  If your cough persists despite 3 months of nexium I am concerned that your cough may be secondary to the asthma.  Your blood pressure (bottom number) was slightly elevated today.  We will follow that up in 3 months to see if it is still high.  If it is above 90 then I will recommend starting a blood pressure medications.  Believe it or not, good control of your sleep apnea can keep the blood pressure under control.  Continue your over the counter allegra and diphenhydramine for your allergies.  We gave you a pneumovax shot, which is now recommended for all asthmatics.  This should help protect you from very severe pneumonias related to this bacteria.  We check your blood for high sugar given your family history and for high cholesterol given your age.  I will contact you if there are any values we need to discuss further.  I will follow you up in 3 months to recheck that blood pressure.

## 2012-05-06 NOTE — Progress Notes (Signed)
Subjective:    Patient ID: Sergio [name], male    DOB: Jan 27, 1976, 36 y.o.   MRN: [id number]  HPI  Sergio [name] is a 36 year old man previously followed by the [facility] who presents to establish care in the [facility]. The following are his acute and chronic issues:  Asthma with allergic rhinitis: Sergio [name] is asthma symptoms have been well controlled of recent time. He finds he only needs to use his albuterol inhaler for shortness of breath no more than 3 times per month. Ironically he is only treating his allergic rhinitis with as needed diphenhydramine and fexofenadine. He no longer is using his fluticasone or his Advair. His asthma has been managed by Dr. Maple Hudson of pulmonary.  Gastroesophageal reflux disease: Sergio [name] stopped taking his use omeprazole one year ago. Over the last few months he has noted increased dyspepsia similar to his previous reflux symptoms. When he has these episodes of reflux he notes some nausea that is associated with some dizziness. He also notes that with the symptomatic worsening of his reflux disease he's developed a chronic cough. It is unclear based on his history whether this is from the reflux disease that has been untreated or cough variant asthma since he has not been taking his steroid inhaler.  Obstructive sleep apnea: Sergio [name] uses a full face mask CPAP each night. He is tolerating this therapy well and states his symptoms of somnolence are much improved. Although he is unaware of his current settings review of the chart suggests he is on 12 cm of water. This is followed by Dr. Maple Hudson in pulmonary.  Irritable bowel syndrome: Sergio [name] suffers from intermittent diarrhea and constipation which has been attributed to irritable bowel syndrome. This has been managed with dietary control. He states the ear we'll bowel syndrome is exacerbated by spicy foods and onions which he tries to avoid.  Hypertension: Sergio [name] has  previously been diagnosed with hypertension but has not been on any therapy recently. He is currently asymptomatic.  Hyperlipidemia: Sergio [name] notes a previous history of hyperlipidemia but has not been on any therapy. This has not been checked for several years.  Impaired fasting glucose: Sergio [name] states he previously was told he had prediabetes. With his family history of diabetes he is concerned that his sugars may be elevated. He denies any polyuria or polydipsia. He also denies any blurred vision. He is interested in having his sugar checked and he is fasting this morning prior to receiving any blood work.  Review of Systems  Constitutional: Positive for fatigue. Negative for activity change and appetite change.  HENT: Negative for congestion, rhinorrhea and neck pain.   Eyes: Negative for visual disturbance.  Respiratory: Positive for cough and chest tightness. Negative for apnea, choking, shortness of breath and wheezing.   Cardiovascular: Positive for palpitations. Negative for chest pain and leg swelling.  Gastrointestinal: Positive for nausea, diarrhea and constipation. Negative for vomiting, abdominal pain and abdominal distention.  Genitourinary: Negative for frequency and decreased urine volume.  Musculoskeletal: Negative for back pain and joint swelling.  Skin: Negative for color change and rash.  Neurological: Positive for dizziness and light-headedness. Negative for syncope, facial asymmetry, weakness, numbness and headaches.  Hematological: Negative for adenopathy.  Psychiatric/Behavioral: Negative for disturbed wake/sleep cycle.       Objective:   Physical Exam  Constitutional: He is oriented to person, place, and time. He appears well-developed and well-nourished.  HENT:  Head: Normocephalic and atraumatic.  Right Ear: External ear normal.  Left Ear: External ear normal.  Mouth/Throat: Oropharynx is clear and moist.  Eyes: Pupils are equal, round, and reactive to  light. Right eye exhibits no discharge. Left eye exhibits no discharge. No scleral icterus.  Neck: Neck supple. No tracheal deviation present. No thyromegaly present.  Cardiovascular: Normal rate, regular rhythm and normal heart sounds.  Exam reveals no gallop and no friction rub.   No murmur heard. Pulmonary/Chest: Effort normal and breath sounds normal. He has no wheezes. He has no rales. He exhibits no tenderness.  Abdominal: Soft. He exhibits no distension. There is no tenderness. There is no rebound and no guarding.  Musculoskeletal: Normal range of motion. He exhibits no edema and no tenderness.  Lymphadenopathy:    He has no cervical adenopathy.  Neurological: He is alert and oriented to person, place, and time. He exhibits normal muscle tone. Coordination normal.  Skin: Skin is warm and dry. No rash noted. No erythema. No pallor.  Psychiatric: He has a normal mood and affect. His behavior is normal. Judgment and thought content normal.      Assessment & Plan:   Please see problem based assessment and plan.

## 2012-05-06 NOTE — Progress Notes (Signed)
Patient ID: Sergio [name], male   DOB: 1976/08/17, 36 y.o.   MRN: [id number] BMP unremarkable with fasting glucose of 89 Total cholesterol 252 Triglycerides 192 HDL 34 LDL 180  I called Mr. Retter but received an identified voice mail where I explained that his sugar was in the normal range but his cholesterol was high.  I mentioned that diet, exercise, weight loss, or medications were an option at this point and told him I would call him back next week so we could discuss how he would like to move forward concerning his hyperlipidemia.

## 2012-05-06 NOTE — Assessment & Plan Note (Signed)
Sergio [name] has an isolated diastolic hypertension reading at this visit. As it is only mildly elevated at 92 we will observe him for the time being. If upon return visit in 3 months he continues to have diastolic hypertension we will initiate therapy with an antihypertensive.

## 2012-06-08 ENCOUNTER — Encounter: Payer: Self-pay | Admitting: Internal Medicine

## 2012-06-08 MED ORDER — SIMVASTATIN 20 MG PO TABS: 20.0000 mg | ORAL_TABLET | Freq: Every evening | ORAL | Status: DC

## 2012-06-08 NOTE — Progress Notes (Signed)
I spoke with Sergio [name] and we discussed all of his lab results.  I mentioned my concern for the hyperlipidemia and we discussed options such as diet, exercise, and medication.  Given the level of the LDL and other factors he is very interested in starting statins.  I will therefore start simvastatin 20 mg PO QHS and reassess his LDL at the next visit.

## 2012-06-08 NOTE — Addendum Note (Signed)
Addended by: Doneen Poisson D on: 06/08/2012 02:48 PM   Modules accepted: Orders

## 2012-11-30 ENCOUNTER — Telehealth: Payer: Self-pay | Admitting: *Deleted

## 2012-11-30 DIAGNOSIS — K219 Gastro-esophageal reflux disease without esophagitis: Secondary | ICD-10-CM

## 2012-11-30 MED ORDER — PANTOPRAZOLE SODIUM 40 MG PO TBEC: 40.0000 mg | DELAYED_RELEASE_TABLET | Freq: Every day | ORAL | Status: DC

## 2012-11-30 NOTE — Telephone Encounter (Signed)
Done.  Please inform patient of the change.  Thanks.

## 2012-11-30 NOTE — Telephone Encounter (Signed)
Note from pharmacy stating Nexium co-pay will now be $25 a months but pantoprazole is 0 co-pay.  Will you change?

## 2013-01-20 ENCOUNTER — Encounter: Payer: 59 | Admitting: Internal Medicine

## 2013-03-03 ENCOUNTER — Ambulatory Visit (INDEPENDENT_AMBULATORY_CARE_PROVIDER_SITE_OTHER): Payer: 59 | Admitting: Internal Medicine

## 2013-03-03 ENCOUNTER — Encounter: Payer: Self-pay | Admitting: Internal Medicine

## 2013-03-03 VITALS — BP 131/97 | HR 82 | Temp 97.7°F | Ht 70.0 in | Wt 226.2 lb

## 2013-03-03 DIAGNOSIS — M549 Dorsalgia, unspecified: Secondary | ICD-10-CM

## 2013-03-03 DIAGNOSIS — K219 Gastro-esophageal reflux disease without esophagitis: Secondary | ICD-10-CM

## 2013-03-03 DIAGNOSIS — K582 Mixed irritable bowel syndrome: Secondary | ICD-10-CM

## 2013-03-03 DIAGNOSIS — E66811 Obesity, class 1: Secondary | ICD-10-CM

## 2013-03-03 DIAGNOSIS — J45909 Unspecified asthma, uncomplicated: Secondary | ICD-10-CM

## 2013-03-03 DIAGNOSIS — K589 Irritable bowel syndrome without diarrhea: Secondary | ICD-10-CM

## 2013-03-03 DIAGNOSIS — K449 Diaphragmatic hernia without obstruction or gangrene: Secondary | ICD-10-CM

## 2013-03-03 DIAGNOSIS — I10 Essential (primary) hypertension: Secondary | ICD-10-CM

## 2013-03-03 DIAGNOSIS — G4733 Obstructive sleep apnea (adult) (pediatric): Secondary | ICD-10-CM

## 2013-03-03 DIAGNOSIS — E669 Obesity, unspecified: Secondary | ICD-10-CM | POA: Insufficient documentation

## 2013-03-03 DIAGNOSIS — E785 Hyperlipidemia, unspecified: Secondary | ICD-10-CM

## 2013-03-03 DIAGNOSIS — J4520 Mild intermittent asthma, uncomplicated: Secondary | ICD-10-CM

## 2013-03-03 HISTORY — DX: Obesity, class 1: E66.811

## 2013-03-03 HISTORY — DX: Obesity, unspecified: E66.9

## 2013-03-03 MED ORDER — HYDROCHLOROTHIAZIDE 12.5 MG PO CAPS: 12.5000 mg | ORAL_CAPSULE | Freq: Every day | ORAL | Status: DC

## 2013-03-03 NOTE — Assessment & Plan Note (Signed)
He has tolerated his simvastatin 20 mg by mouth daily well without myalgias. He will be providing a fasting lipid panel on Monday morning to assure we have reached our target of less than 160. In the meantime we are continuing the simvastatin at the current dose.

## 2013-03-03 NOTE — Assessment & Plan Note (Signed)
He states he's continuing to use the CPAP at night and tolerating it well without any symptoms of obstructive sleep apnea including daytime somnolence. We will continue with the nocturnal nasal CPAP.

## 2013-03-03 NOTE — Assessment & Plan Note (Signed)
With reinitiation of PPI therapy his chronic cough resolved and he has had no difficulty with his asthma. He is only taking as needed albuterol and has not restarted the Advair. This further suggests that his previous exacerbation with a chronic cough was likely related to his reflux. We will therefore continue the as needed albuterol and aggressively treat his underlying acid reflux.

## 2013-03-03 NOTE — Patient Instructions (Signed)
It was great to see you again.  1)  The left side pain I believe is from a tense back muscle.  The first step to treatment is a heating pad and as needed ibuprofen over the counter.  If no better in 3-4 weeks, please let me know with a phone message and we can either adjust the ibuprofen, change it, or add a muscle relaxant depending on how you are doing.  2)  For your blood pressure we started hydrochlorothiazide (also known as HCTZ) 12.5 mg by mouth each morning.  3) Please stop by the lab on Monday morning for a urine sample, cholesterol check and potassium check.  4) Keep taking your other medications as you have.  I am glad the cough is improved.  I think it was the reflux making your asthma worse.  5) Keep working out at the gymn.  I am proud of your efforts.  6) I will see you back in 3 months to repeat your blood pressure and recheck your back, sooner if necessary.

## 2013-03-03 NOTE — Assessment & Plan Note (Signed)
His weight is 2 pounds greater than at the previous visit. He does note that he has been going to a gym recently. Continued exercise was encouraged with the hopes of decreasing his blood pressure and his cholesterol in concert with his weight.. We will followup on the weight at the return visit.

## 2013-03-03 NOTE — Assessment & Plan Note (Signed)
He has a 2 month history of intermittent left flank pain. The pain is described as a dull ache with an occasional sharp stabbing twinge. There is no activity or position that brings the pain on and he has not tried any anti-inflammatories, exercise, or heat to relieve the pain. He denies any trauma, change in activity, overlying rash, hematuria, or radiation to the groin. He also denies any numbness or weakness. Examination was remarkable for left-sided muscular pain associated with tenderness to palpation and muscle tightness in that area. There was no overlying rash or CVA tenderness. We discussed the possibilities and decided the most likely was muscle spasm in the left back. We decided to treat conservatively with heating pad and over-the-counter ibuprofen. If this is ineffective over the next 3-4 weeks he is to call and we will consider adjusting the ibuprofen if he had some relief, changing the ibuprofen to another nonsteroidal anti-inflammatory if he had no relief, or considering the addition of Flexeril given the muscle tightness on examination. He was satisfied with this plan of action. He will provide a urine sample Monday morning to assure he does not have any underlying kidney pathology given the location of the pain. He was unable to produce a sample today as he had just gone prior to the visit.

## 2013-03-03 NOTE — Assessment & Plan Note (Signed)
His diastolic blood pressure remains elevated today at 97. At the last visit it was also elevated at 92. He has a family history of essential hypertension and this likely represents early disease. We will therefore start hydrochlorothiazide 12.5 mg by mouth every morning. He is to start this medication tomorrow and will have a followup BMP to assure no hypokalemia on Monday. It is unlikely he will suffer hypokalemia on this lower dose and we're hoping that it achieves the target blood pressure by followup.

## 2013-03-03 NOTE — Progress Notes (Signed)
  Subjective:    Patient ID: Sergio [name], male    DOB: [birth year], 37 y.o.   MRN: [id number]  HPI Please see the A&P for the status of the pt's chronic medical problems.  Review of Systems  Constitutional: Positive for activity change. Negative for fever, chills, diaphoresis, appetite change and unexpected weight change.  HENT: Negative for neck pain and neck stiffness.   Eyes: Negative for itching.  Respiratory: Negative for cough, choking, chest tightness, shortness of breath, wheezing and stridor.   Cardiovascular: Negative for chest pain, palpitations and leg swelling.  Gastrointestinal: Negative for nausea, abdominal pain, diarrhea and constipation.  Genitourinary: Positive for flank pain.  Musculoskeletal: Positive for myalgias and back pain. Negative for joint swelling and arthralgias.  Neurological: Negative for light-headedness.  Hematological: Negative for adenopathy.  Psychiatric/Behavioral: Negative for dysphoric mood. The patient is not nervous/anxious.       Objective:   Physical Exam  Nursing note and vitals reviewed. Constitutional: He is oriented to person, place, and time. He appears well-developed and well-nourished. No distress.  HENT:  Head: Normocephalic and atraumatic.  Eyes: Conjunctivae are normal. Right eye exhibits no discharge. Left eye exhibits no discharge. No scleral icterus.  Neck: Neck supple. No JVD present. No tracheal deviation present.  Cardiovascular: Normal rate, regular rhythm and normal heart sounds.  Exam reveals no gallop and no friction rub.   No murmur heard. Pulmonary/Chest: Effort normal and breath sounds normal. No stridor. No respiratory distress. He has no wheezes. He has no rales.  Abdominal: Soft. Bowel sounds are normal. He exhibits no distension. There is no tenderness. There is no rebound and no guarding.  Musculoskeletal: Normal range of motion. He exhibits no edema and no tenderness.  Except for tenderness on the left lower  back with accompanying muscle tightness at the spot of tenderness.  Lymphadenopathy:    He has no cervical adenopathy.  Neurological: He is alert and oriented to person, place, and time. He exhibits normal muscle tone.  Skin: Skin is warm and dry. No rash noted. He is not diaphoretic. No erythema. No pallor.  Psychiatric: He has a normal mood and affect. His behavior is normal. Judgment and thought content normal.      Assessment & Plan:   Please see problem oriented charting.

## 2013-03-03 NOTE — Assessment & Plan Note (Signed)
His acid reflux symptoms are improved with the PPI therapy although recently he notes he's had 2 at times to this to get complete relief. In addition his chronic cough has resolved. We will continue the PPI therapy with when necessary times. We will also encourage continued weight loss and this may be likely given his recent work at a [employer]. He was encouraged to continue this exercise regimen.

## 2013-03-03 NOTE — Assessment & Plan Note (Signed)
He has had no difficulty recently with his irritable bowel syndrome.

## 2013-03-06 ENCOUNTER — Other Ambulatory Visit (INDEPENDENT_AMBULATORY_CARE_PROVIDER_SITE_OTHER): Payer: 59

## 2013-03-06 DIAGNOSIS — E785 Hyperlipidemia, unspecified: Secondary | ICD-10-CM

## 2013-03-06 DIAGNOSIS — I10 Essential (primary) hypertension: Secondary | ICD-10-CM

## 2013-03-06 DIAGNOSIS — M549 Dorsalgia, unspecified: Secondary | ICD-10-CM

## 2013-03-06 LAB — LIPID PANEL
Cholesterol: 159 mg/dL (ref 0–200)
HDL: 35 mg/dL — ABNORMAL LOW (ref >39)
LDL Cholesterol: 95 mg/dL (ref 0–99)
Total CHOL/HDL Ratio: 4.5 Ratio
Triglycerides: 144 mg/dL (ref <150)
VLDL: 29 mg/dL (ref 0–40)

## 2013-03-07 LAB — BASIC METABOLIC PANEL WITH GFR
BUN: 11 mg/dL (ref 6–23)
CO2: 30 mEq/L (ref 19–32)
Calcium: 9.4 mg/dL (ref 8.4–10.5)
Chloride: 100 mEq/L (ref 96–112)
Creat: 1.02 mg/dL (ref 0.50–1.35)
GFR, Est African American: >89 mL/min
GFR, Est Non African American: >89 mL/min
Glucose, Bld: 98 mg/dL (ref 70–99)
Potassium: 4.6 mEq/L (ref 3.5–5.3)
Sodium: 138 mEq/L (ref 135–145)

## 2013-03-07 LAB — URINALYSIS, ROUTINE W REFLEX MICROSCOPIC
Bilirubin Urine: NEGATIVE
Glucose, UA: NEGATIVE mg/dL
Hgb urine dipstick: NEGATIVE
Ketones, ur: NEGATIVE mg/dL
Leukocytes, UA: NEGATIVE
Nitrite: NEGATIVE
Protein, ur: NEGATIVE mg/dL
Specific Gravity, Urine: 1.017 (ref 1.005–1.030)
Urobilinogen, UA: 0.2 mg/dL (ref 0.0–1.0)
pH: 6 (ref 5.0–8.0)

## 2013-03-14 NOTE — Progress Notes (Signed)
Urinalysis and BMP unremarkable.  Renal function is excellent with eGFR > 89.  Total cholesterol 159 Triglycerides 144 HDL 35 LDL 95  Simvastatin 20 mg PO QD and exercise has adequately addressed the hyperlipidemia.  We will continue the current regimen/exercise.

## 2013-07-20 ENCOUNTER — Other Ambulatory Visit: Payer: Self-pay

## 2013-08-03 ENCOUNTER — Other Ambulatory Visit: Payer: Self-pay | Admitting: Internal Medicine

## 2013-08-03 DIAGNOSIS — E785 Hyperlipidemia, unspecified: Secondary | ICD-10-CM

## 2013-09-11 ENCOUNTER — Encounter (HOSPITAL_COMMUNITY): Payer: Self-pay | Admitting: Emergency Medicine

## 2013-09-11 ENCOUNTER — Emergency Department (HOSPITAL_COMMUNITY)
Admission: EM | Admit: 2013-09-11 | Discharge: 2013-09-11 | Disposition: A | Discharge status: Home / Self Care | Payer: 59 | Source: Home / Self Care | Attending: Emergency Medicine | Admitting: Emergency Medicine

## 2013-09-11 DIAGNOSIS — J470 Bronchiectasis with acute lower respiratory infection: Secondary | ICD-10-CM

## 2013-09-11 DIAGNOSIS — J471 Bronchiectasis with (acute) exacerbation: Secondary | ICD-10-CM

## 2013-09-11 MED ORDER — GUAIFENESIN-CODEINE 100-10 MG/5ML PO SYRP: 5.0000 mL | ORAL_SOLUTION | Freq: Three times a day (TID) | ORAL | Status: DC | PRN

## 2013-09-11 MED ORDER — PREDNISONE 10 MG PO TABS: 20.0000 mg | ORAL_TABLET | Freq: Two times a day (BID) | ORAL | Status: DC

## 2013-09-11 MED ORDER — AZITHROMYCIN 250 MG PO TABS: ORAL_TABLET | ORAL | Status: DC

## 2013-09-11 NOTE — ED Provider Notes (Signed)
CSN: [id number]     Arrival date & time 09/11/13  1752 History   First MD Initiated Contact with Patient 09/11/13 2037     No chief complaint on file.  (Consider location/radiation/quality/duration/timing/severity/associated sxs/prior Treatment) Patient is a 37 y.o. male presenting with cough. The history is provided by the patient.  Cough Cough characteristics:  Productive Sputum characteristics:  Yellow and bloody Severity:  Moderate Onset quality:  Gradual Duration:  2 weeks Timing:  Intermittent Progression:  Worsening Chronicity:  New Smoker: no   Context: upper respiratory infection   Relieved by:  Nothing Worsened by:  Lying down and activity Ineffective treatments:  Cough suppressants and decongestant Associated symptoms: chills, myalgias, rhinorrhea, shortness of breath, sinus congestion and wheezing   Associated symptoms: no ear fullness, no ear pain, no eye discharge, no fever, no headaches and no sore throat  Chest pain: with cougn.    Sergio [name] is a 37 y.o. male who presents with cough, cold, congestion, wheezing and just feeling bad for 2 weeks. He has taken OTC medications without relief. He has a history of asthma and has been using his inhaler.   Past Medical History  Diagnosis Date  . Asthma with allergic rhinitis 03/11/2008  . Essential hypertension 05/06/2012  . Hyperlipidemia LDL goal < 160 03/11/2008  . Obstructive sleep apnea 11/19/2008    Nocturnal polysomnography 12/14/2008: AHI 20.8, O2 Sat nadir 84%, optimal CPAP 12 cm H2O   . Gastroesophageal reflux disease with hiatal hernia 05/06/2012  . Irritable bowel syndrome with constipation and diarrhea 05/06/2012  . Obesity (BMI 30.0-34.9) 03/03/2013   Past Surgical History  Procedure Laterality Date  . Urethral dilation     Family History  Problem Relation Age of Onset  . Early death Father     Accident  . Hypertension Maternal Uncle   . Diabetes Paternal Aunt   . Hypertension Maternal Grandmother   .  Vaginal cancer Maternal Grandmother   . Diabetes Paternal Grandfather   . ALS Paternal Grandfather   . Breast cancer Paternal Grandmother   . Kidney cancer Paternal Grandmother   . Healthy Mother   . Healthy Sister   . Healthy Sister    History  Substance Use Topics  . Smoking status: Never Smoker   . Smokeless tobacco: Never Used  . Alcohol Use: Yes     Comment: Socially    Review of Systems  Constitutional: Positive for chills. Negative for fever.  HENT: Positive for rhinorrhea. Negative for ear pain and sore throat.   Eyes: Negative for discharge and redness.  Respiratory: Positive for cough, shortness of breath and wheezing.   Cardiovascular: Chest pain: with cougn.  Musculoskeletal: Positive for myalgias.  Neurological: Negative for syncope, light-headedness and headaches.    Allergies  Review of patient's allergies indicates no known allergies.  Home Medications   Current Outpatient Rx  Name  Route  Sig  Dispense  Refill  . albuterol (PROVENTIL HFA) 108 (90 BASE) MCG/ACT inhaler   Inhalation   Inhale 2 puffs into the lungs every 6 (six) hours as needed for wheezing.   1 Inhaler   11   . diphenhydrAMINE (SOMINEX) 25 MG tablet      2 at bedtime for allergies and for sleep as needed          . fexofenadine (ALLEGRA) 180 MG tablet   Oral   Take 180 mg by mouth daily.           . fluticasone (FLONASE) 50  MCG/ACT nasal spray      1-2 sprays each nostril daily          . Fluticasone-Salmeterol (ADVAIR DISKUS) 250-50 MCG/DOSE AEPB   Inhalation   Inhale 1 puff into the lungs every 12 (twelve) hours.           . hydrochlorothiazide (MICROZIDE) 12.5 MG capsule   Oral   Take 1 capsule (12.5 mg total) by mouth daily.   30 capsule   11   . pantoprazole (PROTONIX) 40 MG tablet   Oral   Take 1 tablet (40 mg total) by mouth daily.   90 tablet   3   . simvastatin (ZOCOR) 20 MG tablet   Oral   Take 1 tablet (20 mg total) by mouth daily at 6 PM.   90  tablet   3   BP 147/91  Pulse 85  Temp(Src) 98.5 F (36.9 C) (Oral)  Resp 16  SpO2 96%  Physical Exam  Nursing note and vitals reviewed. Constitutional: He is oriented to person, place, and time. He appears well-developed and well-nourished. No distress.  HENT:  Head: Normocephalic and atraumatic.  Eyes: Conjunctivae and EOM are normal.  Neck: Normal range of motion. Neck supple.  Cardiovascular: Normal rate, regular rhythm and normal heart sounds.   Pulmonary/Chest: Effort normal. No respiratory distress. He has wheezes. He has no rales. He exhibits no tenderness.  Musculoskeletal: Normal range of motion.  Neurological: He is alert and oriented to person, place, and time. No cranial nerve deficit.  Skin: Skin is warm and dry.  Psychiatric: He has a normal mood and affect. His behavior is normal.    ED Course  Procedures  MDM  36 y.o. male with cough, congestion and history of asthma. Symptoms ongoing x 2 weeks. Will treat with Zithromax and Robitussin AC. He will continue to use his Albuterol inhaler as needed. He will follow up with his doctor or return as needed. Discussed with the patient and all questioned fully answered. He voices understanding.   Janne Napoleon, [place] 09/11/13 2110

## 2013-09-11 NOTE — ED Notes (Signed)
Cough, particularly at night making it difficult to sleep.  Reports coughing up a little blood tinged phlegm today, this is what made patient come to [facility] for evaluation

## 2013-09-12 NOTE — ED Provider Notes (Signed)
Medical screening examination/treatment/procedure(s) were performed by non-physician practitioner and as supervising physician I was immediately available for consultation/collaboration.  Areil Ottey, M.D.  Mikele Sifuentes C Merrick Feutz, MD 09/12/13 0021 

## 2013-11-09 ENCOUNTER — Ambulatory Visit: Payer: 59 | Admitting: Internal Medicine

## 2013-11-13 ENCOUNTER — Encounter: Payer: Self-pay | Admitting: Internal Medicine

## 2013-11-13 ENCOUNTER — Ambulatory Visit (INDEPENDENT_AMBULATORY_CARE_PROVIDER_SITE_OTHER): Payer: 59 | Admitting: Internal Medicine

## 2013-11-13 VITALS — BP 131/89 | HR 83 | Temp 97.3°F | Ht 69.0 in | Wt 225.9 lb

## 2013-11-13 DIAGNOSIS — J45909 Unspecified asthma, uncomplicated: Secondary | ICD-10-CM

## 2013-11-13 DIAGNOSIS — I10 Essential (primary) hypertension: Secondary | ICD-10-CM

## 2013-11-13 MED ORDER — FLUTICASONE PROPIONATE 50 MCG/ACT NA SUSP: 1.0000 | Freq: Every day | NASAL | Status: DC

## 2013-11-13 MED ORDER — FEXOFENADINE HCL 180 MG PO TABS: 180.0000 mg | ORAL_TABLET | Freq: Every day | ORAL | Status: DC

## 2013-11-13 NOTE — Progress Notes (Signed)
Subjective:     Patient ID: Sergio [name], male   DOB: 1976-01-24, 38 y.o.   MRN: [id number]  HPI The patient is a 38 YO man with PMH of HTN, allergies, inactive IBS, asthma, OSA who is coming in with dizziness. He states that he had an episode at work when he turned a corner and felt that the room was spinning. He was able to sit down and the episode passed although he had some nausea after the episode. This was about 2 weeks ago. Since that time he has had 2-3 episodes that are less severe and last about 1-2 minutes. During those episodes he feels mildly dizzy and is able to sit and they pass with mild nausea afterwards. He does not feel as though he will pass out and he has not fallen or passed out during these episodes. He denies any sick episode before the first event and denies any activity outside his usual or prolonged time in upside down position or working on cars or fixing things etc. He has not had any chest pain, SOB. He has had increased number of sinus headaches in the last month and has been out of his allergy medicine for some time. He has also been using tylenol and ibuprofen more often. Denies ear pain or discharge. No headache during episodes of dizziness and no visual changes.   Review of Systems  Constitutional: Negative for fever, chills, diaphoresis, activity change, appetite change, fatigue and unexpected weight change.  HENT: Positive for congestion and sinus pressure. Negative for ear discharge, ear pain, facial swelling, hearing loss, mouth sores, nosebleeds, postnasal drip, rhinorrhea, sneezing, sore throat, tinnitus, trouble swallowing and voice change.        Sinus pressure during headache episodes  Eyes: Negative for visual disturbance.  Respiratory: Negative for cough, chest tightness, shortness of breath and wheezing.   Cardiovascular: Negative for chest pain, palpitations and leg swelling.  Gastrointestinal: Positive for nausea. Negative for vomiting, abdominal pain,  diarrhea and constipation.  Allergic/Immunologic: Positive for environmental allergies. Negative for food allergies and immunocompromised state.  Neurological: Positive for dizziness and headaches. Negative for tremors, seizures, syncope, facial asymmetry, speech difficulty, weakness, light-headedness and numbness.  Psychiatric/Behavioral: Negative.        Objective:   Physical Exam  Constitutional: He is oriented to person, place, and time. He appears well-developed and well-nourished. No distress.  HENT:  Head: Normocephalic and atraumatic.  Right Ear: External ear normal.  Left Ear: External ear normal.  Right and Left TM normal. Left ear canal with more external hair and minimal redness in both ear canals likely from cue tip usage.   Eyes: EOM are normal. Pupils are equal, round, and reactive to light. Right eye exhibits no discharge. Left eye exhibits no discharge.  Neck: Normal range of motion. Neck supple. No JVD present. No tracheal deviation present. No thyromegaly present.  Cardiovascular: Normal rate, regular rhythm and normal heart sounds.   Pulmonary/Chest: Breath sounds normal. No respiratory distress. He has no wheezes. He has no rales. He exhibits no tenderness.  Abdominal: Soft. Bowel sounds are normal. He exhibits no distension. There is no tenderness. There is no rebound and no guarding.  Musculoskeletal: Normal range of motion. He exhibits no edema and no tenderness.  Lymphadenopathy:    He has no cervical adenopathy.  Neurological: He is alert and oriented to person, place, and time. He displays normal reflexes. No cranial nerve deficit. He exhibits normal muscle tone. Coordination normal.  Skin: Skin  is warm and dry. No rash noted. He is not diaphoretic. No erythema. No pallor.       Assessment:   1. Dizziness - Likely BPPV and given exercises to help his vestibular system. Advised if episodes worsening or increasing in frequency to return to our clinic.   2.  Please see problem oriented charting.  3. Disposition - The patient will return to his PCP in 3-6 months. Refill on allegra and flonase given. Advised if headaches worsen to come back for further evalation. Advised to resume allergy medicine. Advised to avoid tylenol and ibuprofen if not needed to help prevent rebound headaches.

## 2013-11-13 NOTE — Assessment & Plan Note (Signed)
BP Readings from Last 3 Encounters:  11/13/13 131/89  09/11/13 147/91  03/03/13 131/97    Lab Results  Component Value Date   NA 138 03/06/2013   K 4.6 03/06/2013   CREATININE 1.02 03/06/2013    Assessment: Blood pressure control: controlled Progress toward BP goal:  at goal Comments:   Plan: Medications:  continue current medications, HCTZ 12.5 mg daily Educational resources provided: brochure Self management tools provided: home blood pressure logbook Other plans:

## 2013-11-13 NOTE — Patient Instructions (Signed)
We will give you some information about the exercises you can use to help with your balance tubes in your head. If your dizzy spells gets more intense or more frequent come back to our clinic for further evaluation. If your headaches continue or get worse come back for further evaluation.   We have refilled your allergy medicines. If you need any refills before your next visit please feel free to call our clinic at [phone]. Come back in 3-6 months with your doctor.   Benign Positional Vertigo Vertigo means you feel like you or your surroundings are moving when they are not. Benign positional vertigo is the most common form of vertigo. Benign means that the cause of your condition is not serious. Benign positional vertigo is more common in older adults. CAUSES  Benign positional vertigo is the result of an upset in the labyrinth system. This is an area in the middle ear that helps control your balance. This may be caused by a viral infection, head injury, or repetitive motion. However, often no specific cause is found. SYMPTOMS  Symptoms of benign positional vertigo occur when you move your head or eyes in different directions. Some of the symptoms may include:  Loss of balance and falls.  Vomiting.  Blurred vision.  Dizziness.  Nausea.  Involuntary eye movements (nystagmus). DIAGNOSIS  Benign positional vertigo is usually diagnosed by physical exam. If the specific cause of your benign positional vertigo is unknown, your caregiver may perform imaging tests, such as magnetic resonance imaging (MRI) or computed tomography (CT). TREATMENT  Your caregiver may recommend movements or procedures to correct the benign positional vertigo. Medicines such as meclizine, benzodiazepines, and medicines for nausea may be used to treat your symptoms. In rare cases, if your symptoms are caused by certain conditions that affect the inner ear, you may need surgery. HOME CARE INSTRUCTIONS   Follow your  caregiver's instructions.  Move slowly. Do not make sudden body or head movements.  Avoid driving.  Avoid operating heavy machinery.  Avoid performing any tasks that would be dangerous to you or others during a vertigo episode.  Drink enough fluids to keep your urine clear or pale yellow. SEEK IMMEDIATE MEDICAL CARE IF:   You develop problems with walking, weakness, numbness, or using your arms, hands, or legs.  You have difficulty speaking.  You develop severe headaches.  Your nausea or vomiting continues or gets worse.  You develop visual changes.  Your family or friends notice any behavioral changes.  Your condition gets worse.  You have a fever.  You develop a stiff neck or sensitivity to light. MAKE SURE YOU:   Understand these instructions.  Will watch your condition.  Will get help right away if you are not doing well or get worse. Document Released: 06/08/2006 Document Revised: 11/23/2011 Document Reviewed: 05/21/2011 [facility] Patient Information 2014 [place].

## 2013-11-13 NOTE — Progress Notes (Signed)
Case discussed with Dr. Kollar at the time of the visit.  We reviewed the resident's history and exam and pertinent patient test results.  I agree with the assessment, diagnosis, and plan of care documented in the resident's note.     

## 2013-11-13 NOTE — Assessment & Plan Note (Addendum)
Refilled allegra and flonase and advised him to resume usage. He is not using advair for months and doing well with albuterol usage only 1-2 times per week. Ok for him to not resume at this time.

## 2014-01-19 ENCOUNTER — Other Ambulatory Visit: Payer: Self-pay | Admitting: Internal Medicine

## 2014-01-19 DIAGNOSIS — K449 Diaphragmatic hernia without obstruction or gangrene: Principal | ICD-10-CM

## 2014-01-19 DIAGNOSIS — K219 Gastro-esophageal reflux disease without esophagitis: Secondary | ICD-10-CM

## 2014-02-28 ENCOUNTER — Encounter: Payer: Self-pay | Admitting: Internal Medicine

## 2014-02-28 ENCOUNTER — Ambulatory Visit (INDEPENDENT_AMBULATORY_CARE_PROVIDER_SITE_OTHER): Payer: 59 | Admitting: Internal Medicine

## 2014-02-28 VITALS — BP 141/96 | HR 79 | Temp 97.1°F | Ht 70.0 in | Wt 219.7 lb

## 2014-02-28 DIAGNOSIS — I10 Essential (primary) hypertension: Secondary | ICD-10-CM

## 2014-02-28 DIAGNOSIS — J020 Streptococcal pharyngitis: Secondary | ICD-10-CM | POA: Insufficient documentation

## 2014-02-28 LAB — RAPID STREP SCREEN (MED CTR MEBANE ONLY): Streptococcus, Group A Screen (Direct): NEGATIVE

## 2014-02-28 MED ORDER — AMOXICILLIN-POT CLAVULANATE 875-125 MG PO TABS: 1.0000 | ORAL_TABLET | Freq: Two times a day (BID) | ORAL | Status: AC

## 2014-02-28 MED ORDER — GUAIFENESIN-CODEINE 100-10 MG/5ML PO SYRP: 10.0000 mL | ORAL_SOLUTION | Freq: Three times a day (TID) | ORAL | Status: DC | PRN

## 2014-02-28 NOTE — Assessment & Plan Note (Addendum)
Acute x1 week, worsening, persistent cough with coughing spells worse at night, green yellow productive sputum. R pharyngeal exudate.   -rapid strep sent -will try course of abx: augmentin po bid x10 days -robitussin ac prn -advised patient if symptoms worsen, or fever, chills, chest pain, sob, wheezing to call and let us know right away

## 2014-02-28 NOTE — Assessment & Plan Note (Signed)
BP Readings from Last 3 Encounters:  02/28/14 141/96  11/13/13 131/89  09/11/13 147/91   Lab Results  Component Value Date   NA 138 03/06/2013   K 4.6 03/06/2013   CREATININE 1.02 03/06/2013    Assessment: Blood pressure control: mildly elevated Progress toward BP goal:  at goal  Plan: Medications:  continue current medications hctz 12.5mg  qd

## 2014-02-28 NOTE — Patient Instructions (Signed)
General Instructions: Dear Mr. Pimenta,  I hope you feel better soon.   Please try the augmentin antibiotics twice a day for total 10 days  Please also try the cough syrup up to three times a day as needed for relief  Keep using your breathing treatments and inhalers as needed but if the frequency of use is going up, call us right away  If you have worsening of symptoms, fever, chills, shortness of breath, wheezing call me right away [phone] or if severe go directly to emergency room.  Please bring your medicines with you each time you come to clinic.  Medicines may include prescription medications, over-the-counter medications, herbal remedies, eye drops, vitamins, or other pills.   Progress Toward Treatment Goals:  Treatment Goal 02/28/2014  Blood pressure at goal    Self Care Goals & Plans:  Self Care Goal 11/13/2013  Manage my medications take my medicines as prescribed; bring my medications to every visit; refill my medications on time  Eat healthy foods drink diet soda or water instead of juice or soda; eat more vegetables; eat foods that are low in salt; eat baked foods instead of fried foods; eat fruit for snacks and desserts  Be physically active -    No flowsheet data found.   Care Management & Community Referrals:  Referral 11/13/2013  Referrals made for care management support none needed  Referrals made to community resources none     Amoxicillin; Clavulanic Acid tablets What is this medicine? AMOXICILLIN; CLAVULANIC ACID (a mox i SIL in; KLAV yoo lan ic AS id) is a penicillin antibiotic. It is used to treat certain kinds of bacterial infections. It will not work for colds, flu, or other viral infections. This medicine may be used for other purposes; ask your health care Anchor Dwan or pharmacist if you have questions. COMMON BRAND NAME(S): Augmentin What should I tell my health care Brevan Luberto before I take this medicine? They need to know if you have any of these  conditions: -bowel disease, like colitis -kidney disease -liver disease -mononucleosis -an unusual or allergic reaction to amoxicillin, penicillin, cephalosporin, other antibiotics, clavulanic acid, other medicines, foods, dyes, or preservatives -pregnant or trying to get pregnant -breast-feeding How should I use this medicine? Take this medicine by mouth with a full glass of water. Follow the directions on the prescription label. Take at the start of a meal. Do not crush or chew. If the tablet has a score line, you may cut it in half at the score line for easier swallowing. Take your medicine at regular intervals. Do not take your medicine more often than directed. Take all of your medicine as directed even if you think you are better. Do not skip doses or stop your medicine early. Talk to your pediatrician regarding the use of this medicine in children. Special care may be needed. Overdosage: If you think you have taken too much of this medicine contact a poison control center or emergency room at once. NOTE: This medicine is only for you. Do not share this medicine with others. What if I miss a dose? If you miss a dose, take it as soon as you can. If it is almost time for your next dose, take only that dose. Do not take double or extra doses. What may interact with this medicine? -allopurinol -anticoagulants -birth control pills -methotrexate -probenecid This list may not describe all possible interactions. Give your health care Roberto Romanoski a list of all the medicines, herbs, non-prescription drugs, or dietary  supplements you use. Also tell them if you smoke, drink alcohol, or use illegal drugs. Some items may interact with your medicine. What should I watch for while using this medicine? Tell your doctor or health care professional if your symptoms do not improve. Do not treat diarrhea with over the counter products. Contact your doctor if you have diarrhea that lasts more than 2 days or if  it is severe and watery. If you have diabetes, you may get a false-positive result for sugar in your urine. Check with your doctor or health care professional. Birth control pills may not work properly while you are taking this medicine. Talk to your doctor about using an extra method of birth control. What side effects may I notice from receiving this medicine? Side effects that you should report to your doctor or health care professional as soon as possible: -allergic reactions like skin rash, itching or hives, swelling of the face, lips, or tongue -breathing problems -dark urine -fever or chills, sore throat -redness, blistering, peeling or loosening of the skin, including inside the mouth -seizures -trouble passing urine or change in the amount of urine -unusual bleeding, bruising -unusually weak or tired -white patches or sores in the mouth or throat Side effects that usually do not require medical attention (report to your doctor or health care professional if they continue or are bothersome): -diarrhea -dizziness -headache -nausea, vomiting -stomach upset -vaginal or anal irritation This list may not describe all possible side effects. Call your doctor for medical advice about side effects. You may report side effects to FDA at 1-800-FDA-1088. Where should I keep my medicine? Keep out of the reach of children. Store at room temperature below 25 degrees C (77 degrees F). Keep container tightly closed. Throw away any unused medicine after the expiration date. NOTE: This sheet is a summary. It may not cover all possible information. If you have questions about this medicine, talk to your doctor, pharmacist, or health care Maximiliano Cromartie.  2014, [employer]. (2007-11-24 12:04:30)

## 2014-02-28 NOTE — Progress Notes (Signed)
Subjective:   Patient ID: Sergio [name] male   DOB: 09/08/1976 38 y.o.   MRN: [id number]  HPI: Sergio [name] is a 38 y.o. male with PMH of IBS, GERD, and OSA on CPAP presenting to [facility] today for acute visit with complaints of persistent cough x1 week. He reports symptoms started Tuesday night with fevers up to 101F at home, chills, productive cough with yellow/green sputum, sore throat, headache, and now loss of voice. He says his cough is getting worse (worse at night) with chest soreness from coughing, decreased appetite, and has been requiring more frequent use of his inhalers and has been giving himself breathing treatments at night for relief. No alleviation of symptoms with OTC mediations including mucinex and tylenol. He has tried chloraseptic spray for his throat and did have mild relief of cough with codeine cough syrup he had from before. +sick contact was his wife with similar symptoms, now starting to improve slight.   Past Medical History  Diagnosis Date  . Asthma with allergic rhinitis 03/11/2008  . Essential hypertension 05/06/2012  . Hyperlipidemia LDL goal < 160 03/11/2008  . Obstructive sleep apnea 11/19/2008    Nocturnal polysomnography 12/14/2008: AHI 20.8, O2 Sat nadir 84%, optimal CPAP 12 cm H2O   . Gastroesophageal reflux disease with hiatal hernia 05/06/2012  . Irritable bowel syndrome with constipation and diarrhea 05/06/2012  . Obesity (BMI 30.0-34.9) 03/03/2013   Current Outpatient Prescriptions  Medication Sig Dispense Refill  . albuterol (PROVENTIL HFA) 108 (90 BASE) MCG/ACT inhaler Inhale 2 puffs into the lungs every 6 (six) hours as needed for wheezing.  1 Inhaler  11  . diphenhydrAMINE (SOMINEX) 25 MG tablet 2 at bedtime for allergies and for sleep as needed       . fexofenadine (ALLEGRA) 180 MG tablet Take 1 tablet (180 mg total) by mouth daily.  30 tablet  6  . fluticasone (FLONASE) 50 MCG/ACT nasal spray Place 1-2 sprays into both nostrils daily. 1-2 sprays each  nostril daily  16 g  6  . Fluticasone-Salmeterol (ADVAIR DISKUS) 250-50 MCG/DOSE AEPB Inhale 1 puff into the lungs every 12 (twelve) hours.        . hydrochlorothiazide (MICROZIDE) 12.5 MG capsule Take 1 capsule (12.5 mg total) by mouth daily.  30 capsule  11  . pantoprazole (PROTONIX) 40 MG tablet Take 1 tablet (40 mg total) by mouth daily.  90 tablet  3  . simvastatin (ZOCOR) 20 MG tablet Take 1 tablet (20 mg total) by mouth daily at 6 PM.  90 tablet  3  . amoxicillin-clavulanate (AUGMENTIN) 875-125 MG per tablet Take 1 tablet by mouth 2 (two) times daily.  20 tablet  0  . guaiFENesin-codeine (ROBITUSSIN AC) 100-10 MG/5ML syrup Take 10 mLs by mouth 3 (three) times daily as needed for cough or congestion.  120 mL  0   No current facility-administered medications for this visit.   Family History  Problem Relation Age of Onset  . Early death Father     Accident  . Hypertension Maternal Uncle   . Diabetes Paternal Aunt   . Hypertension Maternal Grandmother   . Vaginal cancer Maternal Grandmother   . Diabetes Paternal Grandfather   . ALS Paternal Grandfather   . Breast cancer Paternal Grandmother   . Kidney cancer Paternal Grandmother   . Healthy Mother   . Healthy Sister   . Healthy Sister    History   Social History  . Marital Status: Married  Spouse Name: N/A    Number of Children: N/A  . Years of Education: N/A   Occupational History  . Medical Records    Social History Main Topics  . Smoking status: Never Smoker   . Smokeless tobacco: Never Used  . Alcohol Use: Yes     Comment: Socially  . Drug Use: No  . Sexual Activity: Yes    Birth Control/ Protection: None   Other Topics Concern  . None   Social History Narrative   Works for [employer] in the [employer].  Married, no children.   Review of Systems:  Constitutional:  Fever and chills now resolved, decreased appetite  HEENT:  Congestion and sore throat  Respiratory:  Cough  Cardiovascular:  Chest  soreness from coughing  Gastrointestinal:  Denies nausea, vomiting, abdominal pain  Genitourinary:  Denies dysuria  Musculoskeletal:  Denies back pain or gait problem  Skin:  Denies pallor, rash and wound.   Neurological:  Denies syncope   Objective:  Physical Exam: Filed Vitals:   02/28/14 0825  BP: 141/96  Pulse: 79  Temp: 97.1 F (36.2 C)  TempSrc: Oral  Height: 5\' 10"  (1.778 m)  Weight: 219 lb 11.2 oz (99.655 kg)  SpO2: 96%   Vitals reviewed. General: sitting in chair, coughing HEENT: EOMI, L ear pain not no visible pus or drainage, erythematous pharynx with mild white ?exudate on R side Neck: mild L cervical tender adenopathy Cardiac: Tachycardia Pulm: Mild expiratory wheezing noted after coughing Abd: soft, nontender, nondistended, BS present Ext: warm and well perfused, no pedal edema, -tenderness to palpation Neuro: alert and oriented X3, strength and sensation to light touch equal in bilateral upper and lower extremities  Assessment & Plan:  Discussed with Dr. Dareen Piano -Augmentin po bid x7 days

## 2014-03-01 NOTE — Progress Notes (Signed)
INTERNAL MEDICINE TEACHING ATTENDING ADDENDUM - Aldine Contes, MD: I reviewed and discussed at the time of visit with the resident Dr. Eula Fried, the patient's medical history, physical examination, diagnosis and results of tests and treatment and I agree with the patient's care as documented.

## 2014-03-02 LAB — CULTURE, GROUP A STREP: Organism ID, Bacteria: NORMAL

## 2014-06-07 ENCOUNTER — Ambulatory Visit (INDEPENDENT_AMBULATORY_CARE_PROVIDER_SITE_OTHER): Payer: 59 | Admitting: Internal Medicine

## 2014-06-07 ENCOUNTER — Encounter: Payer: Self-pay | Admitting: Internal Medicine

## 2014-06-07 ENCOUNTER — Telehealth: Payer: Self-pay | Admitting: *Deleted

## 2014-06-07 VITALS — BP 125/92 | HR 96 | Temp 98.6°F | Ht 70.0 in | Wt 223.1 lb

## 2014-06-07 DIAGNOSIS — J45909 Unspecified asthma, uncomplicated: Secondary | ICD-10-CM

## 2014-06-07 DIAGNOSIS — J4520 Mild intermittent asthma, uncomplicated: Secondary | ICD-10-CM

## 2014-06-07 DIAGNOSIS — K219 Gastro-esophageal reflux disease without esophagitis: Secondary | ICD-10-CM

## 2014-06-07 DIAGNOSIS — I10 Essential (primary) hypertension: Secondary | ICD-10-CM

## 2014-06-07 DIAGNOSIS — J302 Other seasonal allergic rhinitis: Secondary | ICD-10-CM

## 2014-06-07 DIAGNOSIS — R059 Cough, unspecified: Secondary | ICD-10-CM

## 2014-06-07 DIAGNOSIS — R05 Cough: Secondary | ICD-10-CM

## 2014-06-07 DIAGNOSIS — K449 Diaphragmatic hernia without obstruction or gangrene: Secondary | ICD-10-CM

## 2014-06-07 MED ORDER — GUAIFENESIN-CODEINE 100-10 MG/5ML PO SYRP: 10.0000 mL | ORAL_SOLUTION | Freq: Every evening | ORAL | Status: DC | PRN

## 2014-06-07 MED ORDER — MONTELUKAST SODIUM 10 MG PO TABS: 10.0000 mg | ORAL_TABLET | Freq: Every day | ORAL | Status: DC

## 2014-06-07 MED ORDER — SALINE SPRAY 0.65 % NA SOLN: 1.0000 | Freq: Four times a day (QID) | NASAL | Status: DC

## 2014-06-07 MED ORDER — HYDROCHLOROTHIAZIDE 12.5 MG PO CAPS: 12.5000 mg | ORAL_CAPSULE | Freq: Every day | ORAL | Status: DC

## 2014-06-07 MED ORDER — CETIRIZINE HCL 10 MG PO TABS: 10.0000 mg | ORAL_TABLET | Freq: Every day | ORAL | Status: DC

## 2014-06-07 MED ORDER — ALBUTEROL SULFATE HFA 108 (90 BASE) MCG/ACT IN AERS: 2.0000 | INHALATION_SPRAY | Freq: Four times a day (QID) | RESPIRATORY_TRACT | Status: DC | PRN

## 2014-06-07 NOTE — Progress Notes (Signed)
   Subjective:    Patient ID: Sergio [name], male    DOB: Jan 29, 1976, 38 y.o.   MRN: [id number]  Asthma He complains of cough and shortness of breath. There is no wheezing. Associated symptoms include rhinorrhea and sneezing. Pertinent negatives include no appetite change, postnasal drip or sore throat. His past medical history is significant for asthma.  Gastrophageal Reflux He complains of coughing. He reports no abdominal pain, no nausea, no sore throat or no wheezing. Pertinent negatives include no fatigue.   Sergio [name] is a 38 year old man with PMH of asthma, GERD, who presents for evaluation of URI symptoms including a nonproductive cough with chest tighness and a clear nasal discharge for 2 weeks. He has been using Afrin for at least 10 days and notes that his nasal congestion is getting worse. He has been using Zyrtec and Nasonex daily for allergies. He has been using his albuterol inhaler daily and needs refill on this.  He has GERD and states that is not as well managed with Protonix, he prefers Nexium but since this is an OTC medication his insurance no longer pays for it.    Review of Systems  Constitutional: Negative for chills, diaphoresis, activity change, appetite change, fatigue and unexpected weight change.  HENT: Positive for congestion, rhinorrhea and sneezing. Negative for postnasal drip, sinus pressure, sore throat and voice change.   Respiratory: Positive for cough and shortness of breath. Negative for wheezing.   Gastrointestinal: Negative for nausea, vomiting, abdominal pain and diarrhea.  Genitourinary: Negative for dysuria.  Neurological: Negative for dizziness, weakness and light-headedness.  Psychiatric/Behavioral: Negative for agitation.       Objective:   Physical Exam  Nursing note and vitals reviewed. Constitutional: He is oriented to person, place, and time. He appears well-developed and well-nourished. No distress.  HENT:  Mouth/Throat: Oropharynx is  clear and moist. No oropharyngeal exudate.  Pale nasal turbinates bilaterally with no discharge   Cardiovascular: Normal rate and regular rhythm.   Pulmonary/Chest: Effort normal and breath sounds normal. No respiratory distress. He has no wheezes. He has no rales.  Neurological: He is alert and oriented to person, place, and time.  Skin: Skin is warm and dry. He is not diaphoretic.  Psychiatric: He has a normal mood and affect. His behavior is normal.          Assessment & Plan:

## 2014-06-07 NOTE — Patient Instructions (Signed)
-  Start taking Singulair 10mg  at night.  -Stop using Afrin for more than 3 days and it may lead to worse nasal congestion. You may use saline nasal spray to keep your nasal passages moist.  -Continue taking Protonix for acid reflux. Avoid caffeine, spicy foods, and fried foods to prevent worsening of heart burn.  -Take Robitussin-codeine as needed at night for the cough.  -Follow up in 3 months for routine blood pressure recheck.   Please bring your medicines with you each time you come.   Medicines may be  Eye drops  Herbal   Vitamins  Pills  Seeing these help [place] take care of you.

## 2014-06-07 NOTE — Assessment & Plan Note (Signed)
BP Readings from Last 3 Encounters:  06/07/14 125/92  02/28/14 141/96  11/13/13 131/89    Lab Results  Component Value Date   NA 138 03/06/2013   K 4.6 03/06/2013   CREATININE 1.02 03/06/2013    Assessment: Blood pressure control:  Controlled Progress toward BP goal:   At goal Comments: He is on HCTZ 12.5mg  daily and needs refill.   Plan: Medications:  continue current medications Educational resources provided:   Self management tools provided:   Other plans: Follow up in 3 months.

## 2014-06-07 NOTE — Telephone Encounter (Signed)
Pt calls and states he is having a problem w/ acid reflux and needs to be seen, states he has "chest pain" at night for several weeks, he denies h/a, sweating, N&V, abdominal pain, shoulder/ arm pain, jaw pain. He is ask to go to ED or urgent care and is reluctant to do so, he is given an appt here in clinic for 1445 and asked to call 911 for any of the symptoms mentioned or if just chest pain return, he is at this point agreeable

## 2014-06-07 NOTE — Progress Notes (Signed)
Medicine attending: Medical history, presenting problems, physical findings, and medications, reviewed with resident physician Dr. Kennerly and I concur with her evaluation and management plan. Rhett Najera, M.D., FACP 

## 2014-06-07 NOTE — Assessment & Plan Note (Signed)
He prefers to continue on Protonix v paying out-of-pocket for OTC Nexium. -Will continue Protonix 40mg  daily

## 2014-06-07 NOTE — Assessment & Plan Note (Signed)
His symptoms started 2 weeks ago and are improving.  -Pt will continue Zyrtec 10mg  daily and Flonase -Pt advised not to use Afrin more than 3 days to prevent worsening of nasal congestion -Saline nasal spray 4 times daily -Refilled Albuterol inahler -Continue Advair -Start Singular 10mg  qhs -Robitussin-codeine PRN qHS for cough

## 2014-10-30 ENCOUNTER — Other Ambulatory Visit: Payer: Self-pay | Admitting: Internal Medicine

## 2014-10-30 DIAGNOSIS — E785 Hyperlipidemia, unspecified: Secondary | ICD-10-CM

## 2014-12-31 ENCOUNTER — Other Ambulatory Visit: Payer: Self-pay | Admitting: Internal Medicine

## 2015-02-04 ENCOUNTER — Other Ambulatory Visit: Payer: Self-pay | Admitting: Internal Medicine

## 2015-02-04 DIAGNOSIS — E785 Hyperlipidemia, unspecified: Secondary | ICD-10-CM

## 2015-02-06 NOTE — Telephone Encounter (Signed)
i have called the ph#'s listed for him, called the work # today, will continue to wait for rtc

## 2015-02-13 NOTE — Telephone Encounter (Signed)
Have called and left 2 messages, will call again today

## 2015-02-20 ENCOUNTER — Other Ambulatory Visit: Payer: Self-pay | Admitting: Internal Medicine

## 2015-02-20 DIAGNOSIS — J4520 Mild intermittent asthma, uncomplicated: Secondary | ICD-10-CM

## 2015-02-20 DIAGNOSIS — K219 Gastro-esophageal reflux disease without esophagitis: Secondary | ICD-10-CM

## 2015-02-20 DIAGNOSIS — K449 Diaphragmatic hernia without obstruction or gangrene: Principal | ICD-10-CM

## 2015-02-20 MED ORDER — MONTELUKAST SODIUM 10 MG PO TABS: 10.0000 mg | ORAL_TABLET | Freq: Every day | ORAL | Status: DC

## 2015-04-12 ENCOUNTER — Ambulatory Visit (HOSPITAL_COMMUNITY)
Admission: RE | Admit: 2015-04-12 | Discharge: 2015-04-12 | Disposition: A | Discharge status: Home / Self Care | Payer: 59 | Source: Ambulatory Visit | Attending: Internal Medicine | Admitting: Internal Medicine

## 2015-04-12 ENCOUNTER — Encounter: Payer: Self-pay | Admitting: Internal Medicine

## 2015-04-12 ENCOUNTER — Ambulatory Visit (INDEPENDENT_AMBULATORY_CARE_PROVIDER_SITE_OTHER): Payer: 59 | Admitting: Internal Medicine

## 2015-04-12 VITALS — BP 138/97 | HR 95 | Temp 98.2°F | Wt 226.0 lb

## 2015-04-12 DIAGNOSIS — M79645 Pain in left finger(s): Secondary | ICD-10-CM

## 2015-04-12 DIAGNOSIS — E785 Hyperlipidemia, unspecified: Secondary | ICD-10-CM

## 2015-04-12 DIAGNOSIS — K449 Diaphragmatic hernia without obstruction or gangrene: Secondary | ICD-10-CM | POA: Diagnosis not present

## 2015-04-12 DIAGNOSIS — J45909 Unspecified asthma, uncomplicated: Secondary | ICD-10-CM | POA: Diagnosis not present

## 2015-04-12 DIAGNOSIS — J4520 Mild intermittent asthma, uncomplicated: Secondary | ICD-10-CM

## 2015-04-12 DIAGNOSIS — I10 Essential (primary) hypertension: Secondary | ICD-10-CM

## 2015-04-12 DIAGNOSIS — K219 Gastro-esophageal reflux disease without esophagitis: Secondary | ICD-10-CM

## 2015-04-12 DIAGNOSIS — E669 Obesity, unspecified: Secondary | ICD-10-CM

## 2015-04-12 NOTE — Assessment & Plan Note (Signed)
He has noted some pain at the metacarpal phalangeal joint of the left thumb. It is also intermittently weak. On examination he has good muscle bulk and tone in that area and no palpable abnormalities. An x-ray of the left hand failed to reveal any evidence of degenerative changes. 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 There are no other activities that would seem to increase the wear and tear of this particular joint. If the pain becomes problematic he will try Tylenol or NSAIDs for symptomatic relief. We will reassess his symptoms at the follow-up visit.

## 2015-04-12 NOTE — Assessment & Plan Note (Signed)
His symptoms are well controlled on pantoprazole 40 mg by mouth daily. We will therefore continue with this regimen.

## 2015-04-12 NOTE — Assessment & Plan Note (Signed)
He has reassessed his diet and his currently making changes. In addition, he is going to the gym more frequently. He seems committed to some lifestyle changes in hopes of losing some weight. We had a long discussion on diet and exercise and reviewed what an ideal body weight would be, in his case about 175 pounds. That would require a 51 pound weight loss. Instead we decided it would be best to have smaller goals such as 5-10 pounds over the next 3 months and build upon that success. He is interested in returning to clinic for positive reinforcement in 6 months and this appointment will be scheduled. He was praised for his willingness to to try to lose weight as much of his comorbidities are likely directly related to the weight and may resolve as he approaches a normal BMI.

## 2015-04-12 NOTE — Assessment & Plan Note (Signed)
He is tolerating the simvastatin 20 mg by mouth daily without myalgias. Although his cardiovascular event risk in 10 years is less than 4% we both would like to continue this medication for the time being. We will reassess if there's been significant weight loss.

## 2015-04-12 NOTE — Assessment & Plan Note (Signed)
His blood pressure was again elevated at 138/97. He typically has had elevated diastolic blood pressures. He has not been taking his diuretic. He is hopeful that through exercise and diet he will lose weight. We discussed options including pharmacologic therapy or giving exercise and weight loss a chance to positively impact his blood pressure. He chose the latter course and we will reassess his blood pressure in the context of any weight loss, exercise, and dietary changes at the follow-up visit. If he remains with an elevated blood pressure and has not had some weight loss we may consider the reinitiation of pharmacologic antihypertensive therapy at that time.

## 2015-04-12 NOTE — Assessment & Plan Note (Signed)
His asthma is clinically well controlled. He states he requires his albuterol on an as-needed basis maybe twice a week. He does not have nocturnal awakenings or cough. Other than the as needed albuterol he takes Singulair daily. He has not taken his Advair. His reflux seems to be well controlled on PPI therapy and his allergic rhinitis is clinically well controlled on anti-histamines and topical nasal steroids. We will therefore continue with his as needed albuterol, daily Singulair, and aggressive management of his gastroesophageal reflux disease and allergic rhinitis in order to maintain control of his asthma. The occasional cough he does have is due to a postnasal drip which appears to be intermittent and nonproblematic.

## 2015-04-12 NOTE — Patient Instructions (Signed)
It was good to see you again.  I am very happy you are watching your diet and going to the gym.  Even a little weight loss can make a huge difference in your health and we are here to help if you need it.  1) Keep taking your medications as you are (as listed on this sheet).  2) Please get the X-ray of your left hand.  I will call you with the results once I get the radiologist's interpretation.  3) We will hold on restarting the blood pressure medication for now.  With weight loss you may not need it.  4) I will see you back in 6 months, sooner if necessary.

## 2015-04-12 NOTE — Progress Notes (Signed)
   Subjective:    Patient ID: Sergio [name], male    DOB: 05/01/1976, 39 y.o.   MRN: [id number]  HPI  Sergio [name] is here for follow-up of his asthma control, hypertension, and weight. Please see the A&P for the status of the pt's chronic medical problems.  Review of Systems  Constitutional: Positive for activity change. Negative for unexpected weight change.  HENT: Positive for postnasal drip.   Respiratory: Positive for cough. Negative for chest tightness, shortness of breath and wheezing.        Cough is intermittent and during the daytime only.  Cardiovascular: Negative for chest pain, palpitations and leg swelling.  Gastrointestinal: Negative for abdominal pain.  Musculoskeletal: Positive for arthralgias. Negative for back pain.       Left thumb pain which is intermittent.      Objective:   Physical Exam  Constitutional: He is oriented to person, place, and time. He appears well-developed and well-nourished. No distress.  HENT:  Head: Normocephalic and atraumatic.  Eyes: Conjunctivae are normal. Right eye exhibits no discharge. Left eye exhibits no discharge. No scleral icterus.  Cardiovascular: Normal rate, regular rhythm and normal heart sounds.  Exam reveals no gallop and no friction rub.   No murmur heard. Pulmonary/Chest: Effort normal and breath sounds normal. No respiratory distress. He has no wheezes. He has no rales.  Abdominal: Soft. Bowel sounds are normal. He exhibits no distension. There is no tenderness. There is no rebound and no guarding.  Musculoskeletal: Normal range of motion. He exhibits no edema or tenderness.  Neurological: He is alert and oriented to person, place, and time. He exhibits normal muscle tone.  Skin: Skin is warm and dry. No rash noted. He is not diaphoretic. No erythema.  Psychiatric: He has a normal mood and affect. His behavior is normal. Judgment and thought content normal.  Nursing note and vitals reviewed.     Assessment & Plan:     Please see problem oriented charting.

## 2015-05-13 ENCOUNTER — Encounter: Payer: Self-pay | Admitting: Internal Medicine

## 2015-05-13 ENCOUNTER — Ambulatory Visit (INDEPENDENT_AMBULATORY_CARE_PROVIDER_SITE_OTHER): Payer: 59 | Admitting: Internal Medicine

## 2015-05-13 VITALS — BP 144/83 | HR 78 | Temp 97.8°F | Ht 70.0 in | Wt 227.5 lb

## 2015-05-13 DIAGNOSIS — K449 Diaphragmatic hernia without obstruction or gangrene: Secondary | ICD-10-CM | POA: Diagnosis not present

## 2015-05-13 DIAGNOSIS — I10 Essential (primary) hypertension: Secondary | ICD-10-CM

## 2015-05-13 DIAGNOSIS — K219 Gastro-esophageal reflux disease without esophagitis: Secondary | ICD-10-CM

## 2015-05-13 DIAGNOSIS — Z0000 Encounter for general adult medical examination without abnormal findings: Secondary | ICD-10-CM | POA: Insufficient documentation

## 2015-05-13 MED ORDER — PANTOPRAZOLE SODIUM 40 MG PO TBEC: 40.0000 mg | DELAYED_RELEASE_TABLET | Freq: Two times a day (BID) | ORAL | Status: DC

## 2015-05-13 NOTE — Assessment & Plan Note (Signed)
Assessment: Pt with well-controlled hypertension not on anti-hypertensive medication who presents with blood pressure of 144/83.   Plan: -BP 144/83 near goal <140/90  -Continue to monitor, if uncontrolled start low dose HCTZ  -Encourage lifestyle changes  -Obtain CMP

## 2015-05-13 NOTE — Assessment & Plan Note (Signed)
-  Obtain screening HIV Ab -Pt to receive annual influenza vaccination at his job

## 2015-05-13 NOTE — Patient Instructions (Signed)
-Increase your protonix from 40 mg daily to twice a day (take it at least one hr before breakfast and dinner) -If your heartburn does not improve in 4-6 weeks please come back so we can refer you to GI for endoscopy -Will check your bloodwork today and call you with the results  -Pleasure meeting you today!   Food Choices for Gastroesophageal Reflux Disease When you have gastroesophageal reflux disease (GERD), the foods you eat and your eating habits are very important. Choosing the right foods can help ease the discomfort of GERD. WHAT GENERAL GUIDELINES DO I NEED TO FOLLOW?  Choose fruits, vegetables, whole grains, low-fat dairy products, and low-fat meat, fish, and poultry.  Limit fats such as oils, salad dressings, butter, nuts, and avocado.  Keep a food diary to identify foods that cause symptoms.  Avoid foods that cause reflux. These may be different for different people.  Eat frequent small meals instead of three large meals each day.  Eat your meals slowly, in a relaxed setting.  Limit fried foods.  Cook foods using methods other than frying.  Avoid drinking alcohol.  Avoid drinking large amounts of liquids with your meals.  Avoid bending over or lying down until 2-3 hours after eating. WHAT FOODS ARE NOT RECOMMENDED? The following are some foods and drinks that may worsen your symptoms: Vegetables Tomatoes. Tomato juice. Tomato and spaghetti sauce. Chili peppers. Onion and garlic. Horseradish. Fruits Oranges, grapefruit, and lemon (fruit and juice). Meats High-fat meats, fish, and poultry. This includes hot dogs, ribs, ham, sausage, salami, and bacon. Dairy Whole milk and chocolate milk. Sour cream. Cream. Butter. Ice cream. Cream cheese.  Beverages Coffee and tea, with or without caffeine. Carbonated beverages or energy drinks. Condiments Hot sauce. Barbecue sauce.  Sweets/Desserts Chocolate and cocoa. Donuts. Peppermint and spearmint. Fats and Oils High-fat  foods, including [place] fries and potato chips. Other Vinegar. Strong spices, such as black pepper, white pepper, red pepper, cayenne, curry powder, cloves, ginger, and chili powder. The items listed above may not be a complete list of foods and beverages to avoid. Contact your dietitian for more information. Document Released: 08/31/2005 Document Revised: 09/05/2013 Document Reviewed: 07/05/2013 [facility] Patient Information 2015 [place], [place]. This information is not intended to replace advice given to you by your health care provider. Make sure you discuss any questions you have with your health care provider.  Gastroesophageal Reflux Disease, Adult Gastroesophageal reflux disease (GERD) happens when acid from your stomach flows up into the esophagus. When acid comes in contact with the esophagus, the acid causes soreness (inflammation) in the esophagus. Over time, GERD may create small holes (ulcers) in the lining of the esophagus. CAUSES   Increased body weight. This puts pressure on the stomach, making acid rise from the stomach into the esophagus.  Smoking. This increases acid production in the stomach.  Drinking alcohol. This causes decreased pressure in the lower esophageal sphincter (valve or ring of muscle between the esophagus and stomach), allowing acid from the stomach into the esophagus.  Late evening meals and a full stomach. This increases pressure and acid production in the stomach.  A malformed lower esophageal sphincter. Sometimes, no cause is found. SYMPTOMS   Burning pain in the lower part of the mid-chest behind the breastbone and in the mid-stomach area. This may occur twice a week or more often.  Trouble swallowing.  Sore throat.  Dry cough.  Asthma-like symptoms including chest tightness, shortness of breath, or wheezing. DIAGNOSIS  Your caregiver may  be able to diagnose GERD based on your symptoms. In some cases, X-rays and other tests may be done to check  for complications or to check the condition of your stomach and esophagus. TREATMENT  Your caregiver may recommend over-the-counter or prescription medicines to help decrease acid production. Ask your caregiver before starting or adding any new medicines.  HOME CARE INSTRUCTIONS   Change the factors that you can control. Ask your caregiver for guidance concerning weight loss, quitting smoking, and alcohol consumption.  Avoid foods and drinks that make your symptoms worse, such as:  Caffeine or alcoholic drinks.  Chocolate.  Peppermint or mint flavorings.  Garlic and onions.  Spicy foods.  Citrus fruits, such as oranges, lemons, or limes.  Tomato-based foods such as sauce, chili, salsa, and pizza.  Fried and fatty foods.  Avoid lying down for the 3 hours prior to your bedtime or prior to taking a nap.  Eat small, frequent meals instead of large meals.  Wear loose-fitting clothing. Do not wear anything tight around your waist that causes pressure on your stomach.  Raise the head of your bed 6 to 8 inches with wood blocks to help you sleep. Extra pillows will not help.  Only take over-the-counter or prescription medicines for pain, discomfort, or fever as directed by your caregiver.  Do not take aspirin, ibuprofen, or other nonsteroidal anti-inflammatory drugs (NSAIDs). SEEK IMMEDIATE MEDICAL CARE IF:   You have pain in your arms, neck, jaw, teeth, or back.  Your pain increases or changes in intensity or duration.  You develop nausea, vomiting, or sweating (diaphoresis).  You develop shortness of breath, or you faint.  Your vomit is green, yellow, black, or looks like coffee grounds or blood.  Your stool is red, bloody, or black. These symptoms could be signs of other problems, such as heart disease, gastric bleeding, or esophageal bleeding. MAKE SURE YOU:   Understand these instructions.  Will watch your condition.  Will get help right away if you are not doing  well or get worse. Document Released: 06/10/2005 Document Revised: 11/23/2011 Document Reviewed: 03/20/2011 [facility] Patient Information 2015 [place], [place]. This information is not intended to replace advice given to you by your health care provider. Make sure you discuss any questions you have with your health care provider.     General Instructions:   Please bring your medicines with you each time you come to clinic.  Medicines may include prescription medications, over-the-counter medications, herbal remedies, eye drops, vitamins, or other pills.   Progress Toward Treatment Goals:  Treatment Goal 02/28/2014  Blood pressure at goal    Self Care Goals & Plans:  Self Care Goal 05/13/2015  Manage my medications take my medicines as prescribed; bring my medications to every visit; refill my medications on time; follow the sick day instructions if I am sick  Monitor my health keep track of my blood pressure; keep track of my weight  Eat healthy foods eat more vegetables; eat fruit for snacks and desserts; eat baked foods instead of fried foods; eat foods that are low in salt; eat smaller portions  Be physically active find an activity I enjoy    No flowsheet data found.   Care Management & Community Referrals:  Referral 11/13/2013  Referrals made for care management support none needed  Referrals made to community resources none

## 2015-05-13 NOTE — Progress Notes (Signed)
Patient ID: Sergio [name], male   DOB: 29-Jan-1976, 39 y.o.   MRN: [id number]    Subjective:   Patient ID: Sergio [name] male   DOB: [birth year] 39 y.o.   MRN: [id number]  HPI: Sergio [name] is a 39 y.o. pleasant man with past medical history of hypertension, asthma, IBS, hyperlipidemia, obesity, AR, GERD, and left MCP arthritis who presents with chief complaint of acid reflux symptoms.   Her reports that for the past four days he has been having heart burn without regurgitation. He reports pills getting stuck in his throat for a few weeks. He denies weight loss, epigastric pain, melena, alcohol use, or dietary indiscretion. He has been compliant with taking protonix 40 mg daily and just started peptobismo 3-4 times daily and tums 4 times daily with no relief. He reports having an EGD in the past that showed hiatal hernia.   He is not currently taking any anti-hypertensive mediations. He has occasional lightheadedness but denies headache, chest pain, or LE edema.     He is to have flu shot done at his work soon.       Past Medical History  Diagnosis Date  . Asthma with allergic rhinitis 03/11/2008  . Essential hypertension 05/06/2012  . Hyperlipidemia LDL goal < 160 03/11/2008  . Obstructive sleep apnea 11/19/2008    Nocturnal polysomnography 12/14/2008: AHI 20.8, O2 Sat nadir 84%, optimal CPAP 12 cm H2O   . Gastroesophageal reflux disease with hiatal hernia 05/06/2012  . Irritable bowel syndrome with constipation and diarrhea 05/06/2012  . Obesity (BMI 30.0-34.9) 03/03/2013   Current Outpatient Prescriptions  Medication Sig Dispense Refill  . albuterol (PROVENTIL HFA) 108 (90 BASE) MCG/ACT inhaler Inhale 2 puffs into the lungs every 6 (six) hours as needed for wheezing. 1 Inhaler 11  . cetirizine (ZYRTEC) 10 MG tablet Take 1 tablet (10 mg total) by mouth daily. 30 tablet 6  . diphenhydrAMINE (SOMINEX) 25 MG tablet 2 at bedtime for allergies and for sleep as needed     . fluticasone  (FLONASE) 50 MCG/ACT nasal spray PLACE 1-2 SPRAYS INTO BOTH NOSTRILS DAILY. 16 g 6  . montelukast (SINGULAIR) 10 MG tablet Take 1 tablet (10 mg total) by mouth at bedtime. 90 tablet 3  . pantoprazole (PROTONIX) 40 MG tablet Take 1 tablet (40 mg total) by mouth daily. 90 tablet 3  . simvastatin (ZOCOR) 20 MG tablet Take 1 tablet (20 mg total) by mouth daily at 6 PM. 90 tablet 3   No current facility-administered medications for this visit.   Family History  Problem Relation Age of Onset  . Early death Father     Accident  . Hypertension Maternal Uncle   . Diabetes Paternal Aunt   . Hypertension Maternal Grandmother   . Vaginal cancer Maternal Grandmother   . Diabetes Paternal Grandfather   . ALS Paternal Grandfather   . Breast cancer Paternal Grandmother   . Kidney cancer Paternal Grandmother   . Healthy Mother   . Healthy Sister   . Healthy Sister    Social History   Social History  . Marital Status: Married    Spouse Name: N/A  . Number of Children: N/A  . Years of Education: N/A   Occupational History  . Medical Records    Social History Main Topics  . Smoking status: Never Smoker   . Smokeless tobacco: Never Used  . Alcohol Use: Yes     Comment: Socially  . Drug Use: No  . Sexual  Activity: Yes    Birth Control/ Protection: None   Other Topics Concern  . Not on file   Social History Narrative   Works for [employer] in the [employer].  Married, no children.   Review of Systems: Review of Systems  Constitutional: Positive for malaise/fatigue. Negative for weight loss.       Decreased appetite  HENT:       Dry mouth  Respiratory: Negative for cough, shortness of breath and wheezing.   Cardiovascular: Negative for chest pain and leg swelling.  Gastrointestinal: Positive for heartburn and diarrhea (loose after peptobismo). Negative for nausea, vomiting, abdominal pain, constipation and blood in stool.  Genitourinary: Negative for dysuria, urgency,  frequency and hematuria.  Neurological: Positive for dizziness and headaches.     Objective:  Physical Exam: Filed Vitals:   05/13/15 1353  BP: 144/83  Pulse: 78  Temp: 97.8 F (36.6 C)  TempSrc: Oral  Height: 5\' 10"  (1.778 m)  Weight: 227 lb 8 oz (103.193 kg)  SpO2: 97%    Physical Exam  Constitutional: He is oriented to person, place, and time. He appears well-developed and well-nourished. No distress.  HENT:  Head: Normocephalic and atraumatic.  Right Ear: External ear normal.  Left Ear: External ear normal.  Nose: Nose normal.  Mouth/Throat: No oropharyngeal exudate.  Mild pharyngeal erythema   Eyes: Conjunctivae and EOM are normal. Pupils are equal, round, and reactive to light. Right eye exhibits no discharge. Left eye exhibits no discharge. No scleral icterus.  Neck: Normal range of motion. Neck supple.  Cardiovascular: Normal rate, regular rhythm and normal heart sounds.   Pulmonary/Chest: Effort normal and breath sounds normal. No respiratory distress. He has no wheezes. He has no rales.  Abdominal: Soft. Bowel sounds are normal. He exhibits no distension. There is no tenderness. There is no rebound and no guarding.  Musculoskeletal: Normal range of motion. He exhibits no edema or tenderness.  Neurological: He is alert and oriented to person, place, and time.  Skin: Skin is warm and dry. No rash noted. He is not diaphoretic. No erythema. No pallor.  Psychiatric: He has a normal mood and affect. His behavior is normal. Judgment and thought content normal.    Assessment & Plan:   Please see problem list for problem-based assessment and plan

## 2015-05-13 NOTE — Assessment & Plan Note (Addendum)
Assessment: Pt with history of GERD compliant with PPI therapy who presents with uncontrolled symptoms of unclear trigger possibly due to pill-induced esophagitis.   Plan:  -Increase protonix from 40 mg daily to BID to be taken at least 1 hr before breakfast and dinner -Obtain CBC w/diff to assess for anemia  -Pt given handout on GERD lifestyle changes  -Pt instructed to return in 4-6 weeks if no improvement at which point will need referral to GI for EGD

## 2015-05-14 LAB — CBC WITH DIFFERENTIAL/PLATELET
BASOS ABS: 0 10*3/uL (ref 0.0–0.2)
Basos: 0 %
EOS (ABSOLUTE): 0 10*3/uL (ref 0.0–0.4)
EOS: 0 %
HEMATOCRIT: 46.6 % (ref 37.5–51.0)
Hemoglobin: 15.9 g/dL (ref 12.6–17.7)
IMMATURE GRANULOCYTES: 0 %
Immature Grans (Abs): 0 10*3/uL (ref 0.0–0.1)
LYMPHS ABS: 2.3 10*3/uL (ref 0.7–3.1)
Lymphs: 25 %
MCH: 29.2 pg (ref 26.6–33.0)
MCHC: 34.1 g/dL (ref 31.5–35.7)
MCV: 86 fL (ref 79–97)
MONOS ABS: 0.7 10*3/uL (ref 0.1–0.9)
Monocytes: 8 %
NEUTROS PCT: 67 %
Neutrophils Absolute: 6.1 10*3/uL (ref 1.4–7.0)
PLATELETS: 283 10*3/uL (ref 150–379)
RBC: 5.45 x10E6/uL (ref 4.14–5.80)
RDW: 14.1 % (ref 12.3–15.4)
WBC: 9.2 10*3/uL (ref 3.4–10.8)

## 2015-05-14 LAB — COMPREHENSIVE METABOLIC PANEL
ALK PHOS: 82 IU/L (ref 39–117)
ALT: 23 IU/L (ref 0–44)
AST: 18 IU/L (ref 0–40)
Albumin/Globulin Ratio: 1.6 (ref 1.1–2.5)
Albumin: 4.5 g/dL (ref 3.5–5.5)
BILIRUBIN TOTAL: 0.4 mg/dL (ref 0.0–1.2)
BUN/Creatinine Ratio: 10 (ref 9–20)
BUN: 10 mg/dL (ref 6–24)
CHLORIDE: 96 mmol/L — AB (ref 97–108)
CO2: 24 mmol/L (ref 18–29)
CREATININE: 0.99 mg/dL (ref 0.76–1.27)
Calcium: 9.4 mg/dL (ref 8.7–10.2)
GFR calc Af Amer: 109 mL/min/{1.73_m2} (ref >59)
GFR calc non Af Amer: 94 mL/min/{1.73_m2} (ref >59)
GLOBULIN, TOTAL: 2.9 g/dL (ref 1.5–4.5)
GLUCOSE: 90 mg/dL (ref 65–99)
Potassium: 4.9 mmol/L (ref 3.5–5.2)
SODIUM: 140 mmol/L (ref 134–144)
Total Protein: 7.4 g/dL (ref 6.0–8.5)

## 2015-05-14 LAB — HIV ANTIBODY (ROUTINE TESTING W REFLEX): HIV SCREEN 4TH GENERATION: NONREACTIVE

## 2015-05-15 NOTE — Progress Notes (Signed)
Internal Medicine Clinic Attending  Case discussed with Dr. Rabbani soon after the resident saw the patient.  We reviewed the resident's history and exam and pertinent patient test results.  I agree with the assessment, diagnosis, and plan of care documented in the resident's note.  

## 2015-10-01 DIAGNOSIS — R5383 Other fatigue: Secondary | ICD-10-CM | POA: Diagnosis not present

## 2015-10-01 DIAGNOSIS — R6882 Decreased libido: Secondary | ICD-10-CM | POA: Diagnosis not present

## 2015-10-01 DIAGNOSIS — Z125 Encounter for screening for malignant neoplasm of prostate: Secondary | ICD-10-CM | POA: Diagnosis not present

## 2015-10-01 DIAGNOSIS — E559 Vitamin D deficiency, unspecified: Secondary | ICD-10-CM | POA: Diagnosis not present

## 2015-10-01 DIAGNOSIS — E785 Hyperlipidemia, unspecified: Secondary | ICD-10-CM | POA: Diagnosis not present

## 2015-10-01 DIAGNOSIS — Z131 Encounter for screening for diabetes mellitus: Secondary | ICD-10-CM | POA: Diagnosis not present

## 2015-10-01 DIAGNOSIS — E669 Obesity, unspecified: Secondary | ICD-10-CM | POA: Diagnosis not present

## 2015-10-01 DIAGNOSIS — Z0389 Encounter for observation for other suspected diseases and conditions ruled out: Secondary | ICD-10-CM | POA: Diagnosis not present

## 2015-10-10 DIAGNOSIS — R0600 Dyspnea, unspecified: Secondary | ICD-10-CM | POA: Diagnosis not present

## 2015-10-29 ENCOUNTER — Encounter: Payer: Self-pay | Admitting: Internal Medicine

## 2015-10-29 DIAGNOSIS — E559 Vitamin D deficiency, unspecified: Secondary | ICD-10-CM | POA: Diagnosis not present

## 2015-10-29 DIAGNOSIS — E785 Hyperlipidemia, unspecified: Secondary | ICD-10-CM | POA: Diagnosis not present

## 2015-10-29 DIAGNOSIS — E291 Testicular hypofunction: Secondary | ICD-10-CM | POA: Diagnosis not present

## 2015-10-29 DIAGNOSIS — E669 Obesity, unspecified: Secondary | ICD-10-CM | POA: Diagnosis not present

## 2015-11-23 DIAGNOSIS — H52223 Regular astigmatism, bilateral: Secondary | ICD-10-CM | POA: Diagnosis not present

## 2015-11-28 DIAGNOSIS — E291 Testicular hypofunction: Secondary | ICD-10-CM | POA: Diagnosis not present

## 2015-11-28 DIAGNOSIS — E559 Vitamin D deficiency, unspecified: Secondary | ICD-10-CM | POA: Diagnosis not present

## 2015-11-28 DIAGNOSIS — R5383 Other fatigue: Secondary | ICD-10-CM | POA: Diagnosis not present

## 2015-11-28 DIAGNOSIS — E785 Hyperlipidemia, unspecified: Secondary | ICD-10-CM | POA: Diagnosis not present

## 2015-11-28 MED FILL — CLOMIPHENE CITRATE 50 MG TA: 50 | 30 days supply | Qty: 30 | Fill #0

## 2015-12-06 MED FILL — PANTOPRAZOLE SOD DR 40 MG T: 40 | 90 days supply | Qty: 90 | Fill #1

## 2015-12-06 MED FILL — FLUTICASONE PROP 50 MCG SPR: 50 | 30 days supply | Qty: 16 | Fill #5

## 2015-12-06 MED FILL — SIMVASTATIN 20 MG TABLET: 20 | 90 days supply | Qty: 90 | Fill #3

## 2015-12-24 MED FILL — MONTELUKAST SOD 10 MG TAB: 10 | 90 days supply | Qty: 90 | Fill #3

## 2016-01-02 MED FILL — CLOMIPHENE CITRATE 50 MG TA: 50 | 30 days supply | Qty: 30 | Fill #1

## 2016-01-08 DIAGNOSIS — E291 Testicular hypofunction: Secondary | ICD-10-CM | POA: Diagnosis not present

## 2016-01-08 DIAGNOSIS — R739 Hyperglycemia, unspecified: Secondary | ICD-10-CM | POA: Diagnosis not present

## 2016-01-08 DIAGNOSIS — R5383 Other fatigue: Secondary | ICD-10-CM | POA: Diagnosis not present

## 2016-01-08 DIAGNOSIS — E559 Vitamin D deficiency, unspecified: Secondary | ICD-10-CM | POA: Diagnosis not present

## 2016-01-08 DIAGNOSIS — E785 Hyperlipidemia, unspecified: Secondary | ICD-10-CM | POA: Diagnosis not present

## 2016-02-05 ENCOUNTER — Other Ambulatory Visit: Payer: Self-pay | Admitting: Internal Medicine

## 2016-02-05 MED FILL — CLOMIPHENE CITRATE 50 MG TA: 50 | 30 days supply | Qty: 30 | Fill #2

## 2016-02-26 DIAGNOSIS — E291 Testicular hypofunction: Secondary | ICD-10-CM | POA: Diagnosis not present

## 2016-02-26 DIAGNOSIS — E669 Obesity, unspecified: Secondary | ICD-10-CM | POA: Diagnosis not present

## 2016-02-26 DIAGNOSIS — E785 Hyperlipidemia, unspecified: Secondary | ICD-10-CM | POA: Diagnosis not present

## 2016-03-11 ENCOUNTER — Other Ambulatory Visit: Payer: Self-pay | Admitting: Internal Medicine

## 2016-03-11 MED FILL — FLUTICASONE PROP 50 MCG SPR: 50 | 30 days supply | Qty: 16 | Fill #0

## 2016-03-12 MED FILL — CLOMIPHENE CITRATE 50 MG TA: 50 | 30 days supply | Qty: 30 | Fill #3

## 2016-03-19 ENCOUNTER — Other Ambulatory Visit: Payer: Self-pay | Admitting: Internal Medicine

## 2016-03-19 DIAGNOSIS — K219 Gastro-esophageal reflux disease without esophagitis: Secondary | ICD-10-CM

## 2016-03-19 DIAGNOSIS — K449 Diaphragmatic hernia without obstruction or gangrene: Principal | ICD-10-CM

## 2016-03-19 MED FILL — PANTOPRAZOLE SOD DR 40 MG T: 40 | 90 days supply | Qty: 90 | Fill #0

## 2016-03-19 NOTE — Telephone Encounter (Signed)
Last visit 05/13/2015

## 2016-04-13 ENCOUNTER — Other Ambulatory Visit: Payer: Self-pay | Admitting: Internal Medicine

## 2016-04-13 DIAGNOSIS — J4520 Mild intermittent asthma, uncomplicated: Secondary | ICD-10-CM

## 2016-04-13 NOTE — Telephone Encounter (Signed)
04/12/15, last visit

## 2016-04-14 MED FILL — MONTELUKAST SOD 10 MG TAB: 10 | 90 days supply | Qty: 90 | Fill #0

## 2016-04-14 MED FILL — CLOMIPHENE CITRATE 50 MG TA: 50 | 30 days supply | Qty: 30 | Fill #0

## 2016-05-11 MED FILL — FLUTICASONE PROP 50 MCG SPR: 50 | 30 days supply | Qty: 16 | Fill #1

## 2016-05-15 ENCOUNTER — Ambulatory Visit: Payer: 59 | Admitting: Internal Medicine

## 2016-05-15 ENCOUNTER — Encounter: Payer: Self-pay | Admitting: Internal Medicine

## 2016-05-21 DIAGNOSIS — M79673 Pain in unspecified foot: Secondary | ICD-10-CM | POA: Diagnosis not present

## 2016-05-21 MED FILL — IBUPROFEN 800 MG TABLET: 800 | 10 days supply | Qty: 30 | Fill #0

## 2016-05-25 ENCOUNTER — Encounter: Payer: Self-pay | Admitting: Orthopaedic Surgery

## 2016-05-27 ENCOUNTER — Encounter: Payer: Self-pay | Admitting: Orthopaedic Surgery

## 2016-05-27 ENCOUNTER — Ambulatory Visit (INDEPENDENT_AMBULATORY_CARE_PROVIDER_SITE_OTHER): Payer: 59

## 2016-05-27 ENCOUNTER — Ambulatory Visit (INDEPENDENT_AMBULATORY_CARE_PROVIDER_SITE_OTHER): Payer: 59 | Admitting: Orthopaedic Surgery

## 2016-05-27 VITALS — BP 145/104 | HR 89 | Ht 70.0 in | Wt 226.0 lb

## 2016-05-27 DIAGNOSIS — M79672 Pain in left foot: Secondary | ICD-10-CM

## 2016-05-27 DIAGNOSIS — J4520 Mild intermittent asthma, uncomplicated: Secondary | ICD-10-CM | POA: Diagnosis not present

## 2016-05-27 DIAGNOSIS — I10 Essential (primary) hypertension: Secondary | ICD-10-CM

## 2016-05-27 NOTE — Progress Notes (Signed)
Subjective: I have a knot on my left foot that hurts    Patient ID: Sergio [name], male    DOB: 1976-02-11, 40 y.o.   MRN: [id number]  HPI Last week he noticed a small "knot" on the plantar side of the left foot near the second metatarsal area proximally.  He denies stepping on any thing, denies punctures or change in shoe wear.  It hurts.  It is not red.  Ice, heat has not helped.  He limps.  He cannot walk well secondary to pain.  He saw his family doctor and is referred here.   Review of Systems  HENT: Negative for congestion.   Respiratory: Positive for shortness of breath. Negative for cough.   Cardiovascular: Negative for chest pain and leg swelling.  Endocrine: Negative for cold intolerance.  Musculoskeletal: Positive for gait problem.  Allergic/Immunologic: Positive for environmental allergies.   Past Medical History:  Diagnosis Date  . Asthma with allergic rhinitis 03/11/2008  . Essential hypertension 05/06/2012  . Gastroesophageal reflux disease with hiatal hernia 05/06/2012  . Hyperlipidemia LDL goal < 160 03/11/2008  . Irritable bowel syndrome with constipation and diarrhea 05/06/2012  . Obesity (BMI 30.0-34.9) 03/03/2013  . Obstructive sleep apnea 11/19/2008   Nocturnal polysomnography 12/14/2008: AHI 20.8, O2 Sat nadir 84%, optimal CPAP 12 cm H2O     Past Surgical History:  Procedure Laterality Date  . URETHRAL DILATION      Current Outpatient Prescriptions on File Prior to Visit  Medication Sig Dispense Refill  . albuterol (PROVENTIL HFA) 108 (90 BASE) MCG/ACT inhaler Inhale 2 puffs into the lungs every 6 (six) hours as needed for wheezing. 1 Inhaler 11  . cetirizine (ZYRTEC) 10 MG tablet Take 1 tablet (10 mg total) by mouth daily. 30 tablet 6  . diphenhydrAMINE (SOMINEX) 25 MG tablet 2 at bedtime for allergies and for sleep as needed     . fluticasone (FLONASE) 50 MCG/ACT nasal spray INSTILL 1 TO 2 SPRAYS INTO BOTH NOSTRILS DAILY. 16 g 6  . montelukast (SINGULAIR) 10  MG tablet Take 1 tablet (10 mg total) by mouth at bedtime. 90 tablet 0  . pantoprazole (PROTONIX) 40 MG tablet Take 1 tablet (40 mg total) by mouth daily. 90 tablet 3  . simvastatin (ZOCOR) 20 MG tablet Take 1 tablet (20 mg total) by mouth daily at 6 PM. 90 tablet 3   No current facility-administered medications on file prior to visit.     Social History   Social History  . Marital status: Married    Spouse name: N/A  . Number of children: N/A  . Years of education: N/A   Occupational History  . Medical Records    Social History Main Topics  . Smoking status: Never Smoker  . Smokeless tobacco: Never Used  . Alcohol use Yes     Comment: Socially  . Drug use: No  . Sexual activity: Yes    Birth control/ protection: None   Other Topics Concern  . Not on file   Social History Narrative   Works for [employer] in the [employer].  Married, no children.    Family History  Problem Relation Age of Onset  . Early death Father     Accident  . Hypertension Maternal Uncle   . Diabetes Paternal Aunt   . Hypertension Maternal Grandmother   . Vaginal cancer Maternal Grandmother   . Diabetes Paternal Grandfather   . ALS Paternal Grandfather   . Breast cancer Paternal  Grandmother   . Kidney cancer Paternal Grandmother   . Healthy Mother   . Healthy Sister   . Healthy Sister     BP (!) 145/104   Pulse 89   Ht 5\' 10"  (1.778 m)   Wt 226 lb (102.5 kg)   BMI 32.43 kg/m       Objective:   Physical Exam  Constitutional: He is oriented to person, place, and time. He appears well-developed and well-nourished.  HENT:  Head: Normocephalic and atraumatic.  Eyes: Conjunctivae and EOM are normal. Pupils are equal, round, and reactive to light.  Neck: Normal range of motion. Neck supple.  Cardiovascular: Normal rate, regular rhythm and intact distal pulses.   Pulmonary/Chest: Effort normal.  Abdominal: Soft.  Musculoskeletal: He exhibits tenderness (Left foot plantar side  has small two grape seed lesion near base of second metatarsal, very tender, no fluctuant, no redness, nonmoveable.  NV intact.  Right negative.).  Neurological: He is alert and oriented to person, place, and time. He has normal reflexes. He displays normal reflexes. No cranial nerve deficit. He exhibits normal muscle tone. Coordination normal.  Skin: Skin is warm and dry.  Psychiatric: He has a normal mood and affect. His behavior is normal. Judgment and thought content normal.    X-rays were done of the left foot with marker over the lesion, reported separately.      Assessment & Plan:   Encounter Diagnoses  Name Primary?  . Left foot pain Yes  . Essential hypertension   . Asthma with allergic rhinitis, mild intermittent, uncomplicated    Procedure note:  After sterile prep and permission from the patient, I injected the lesion and the area around the lesion with 1% Xylocaine and 1 cc DepoMedrol 40 by sterile technique tolerated well.  Return in one week.  Call if any problem.  Precautions discussed.  Electronically Signed Sanjuana Kava, MD 9/13/20178:48 AM

## 2016-06-03 ENCOUNTER — Ambulatory Visit: Payer: 59 | Admitting: Orthopaedic Surgery

## 2016-06-08 MED FILL — CLOMIPHENE CITRATE 50 MG TA: 50 | 30 days supply | Qty: 30 | Fill #1

## 2016-06-09 DIAGNOSIS — R7301 Impaired fasting glucose: Secondary | ICD-10-CM | POA: Diagnosis not present

## 2016-06-09 DIAGNOSIS — E291 Testicular hypofunction: Secondary | ICD-10-CM | POA: Diagnosis not present

## 2016-06-09 DIAGNOSIS — Z713 Dietary counseling and surveillance: Secondary | ICD-10-CM | POA: Diagnosis not present

## 2016-06-09 DIAGNOSIS — E559 Vitamin D deficiency, unspecified: Secondary | ICD-10-CM | POA: Diagnosis not present

## 2016-06-09 DIAGNOSIS — E669 Obesity, unspecified: Secondary | ICD-10-CM | POA: Diagnosis not present

## 2016-06-22 MED FILL — PANTOPRAZOLE SOD DR 40 MG T: 40 | 90 days supply | Qty: 90 | Fill #1

## 2016-07-14 ENCOUNTER — Other Ambulatory Visit: Payer: Self-pay | Admitting: Internal Medicine

## 2016-07-14 DIAGNOSIS — J4520 Mild intermittent asthma, uncomplicated: Secondary | ICD-10-CM

## 2016-07-14 MED FILL — FLUTICASONE PROP 50 MCG SPR: 50 | 30 days supply | Qty: 16 | Fill #2

## 2016-07-15 MED FILL — MONTELUKAST SOD 10 MG TAB: 10 | 90 days supply | Qty: 90 | Fill #0

## 2016-07-21 ENCOUNTER — Encounter: Payer: Self-pay | Admitting: Internal Medicine

## 2016-08-02 MED FILL — CLOMIPHENE CITRATE 50 MG TA: 50 | 30 days supply | Qty: 30 | Fill #2

## 2016-09-09 MED FILL — CLOMIPHENE CITRATE 50 MG TA: 50 | 30 days supply | Qty: 30 | Fill #0

## 2016-09-28 MED FILL — FLUTICASONE PROP 50 MCG SPR: 50 | 30 days supply | Qty: 16 | Fill #3

## 2016-10-08 MED FILL — PANTOPRAZOLE SOD DR 40 MG T: 40 | 90 days supply | Qty: 90 | Fill #2

## 2016-10-20 MED FILL — CLOMIPHENE CITRATE 50 MG TA: 50 | 30 days supply | Qty: 30 | Fill #1

## 2016-10-27 DIAGNOSIS — Z0389 Encounter for observation for other suspected diseases and conditions ruled out: Secondary | ICD-10-CM | POA: Diagnosis not present

## 2016-10-27 DIAGNOSIS — E559 Vitamin D deficiency, unspecified: Secondary | ICD-10-CM | POA: Diagnosis not present

## 2016-10-27 DIAGNOSIS — Z0000 Encounter for general adult medical examination without abnormal findings: Secondary | ICD-10-CM | POA: Diagnosis not present

## 2016-10-27 DIAGNOSIS — E785 Hyperlipidemia, unspecified: Secondary | ICD-10-CM | POA: Diagnosis not present

## 2016-10-27 DIAGNOSIS — E291 Testicular hypofunction: Secondary | ICD-10-CM | POA: Diagnosis not present

## 2016-10-29 DIAGNOSIS — R112 Nausea with vomiting, unspecified: Secondary | ICD-10-CM | POA: Diagnosis not present

## 2016-10-29 DIAGNOSIS — R509 Fever, unspecified: Secondary | ICD-10-CM | POA: Diagnosis not present

## 2016-11-03 MED FILL — MONTELUKAST SOD 10 MG TAB: 10 | 90 days supply | Qty: 90 | Fill #1

## 2016-11-16 DIAGNOSIS — Z0389 Encounter for observation for other suspected diseases and conditions ruled out: Secondary | ICD-10-CM | POA: Diagnosis not present

## 2016-12-25 MED FILL — CLOMIPHENE CITRATE 50 MG TA: 50 | 30 days supply | Qty: 30 | Fill #2

## 2017-01-21 DIAGNOSIS — E559 Vitamin D deficiency, unspecified: Secondary | ICD-10-CM | POA: Diagnosis not present

## 2017-01-21 DIAGNOSIS — Z7189 Other specified counseling: Secondary | ICD-10-CM | POA: Diagnosis not present

## 2017-01-21 DIAGNOSIS — H832X9 Labyrinthine dysfunction, unspecified ear: Secondary | ICD-10-CM | POA: Diagnosis not present

## 2017-01-21 DIAGNOSIS — E291 Testicular hypofunction: Secondary | ICD-10-CM | POA: Diagnosis not present

## 2017-01-21 DIAGNOSIS — E785 Hyperlipidemia, unspecified: Secondary | ICD-10-CM | POA: Diagnosis not present

## 2017-01-21 MED FILL — predniSONE 20 MG TABS: 20 | 5 days supply | Qty: 10 | Fill #0

## 2017-02-01 MED FILL — CLOMIPHENE CITRATE 50 MG TA: 50 | 30 days supply | Qty: 30 | Fill #0

## 2017-02-01 MED FILL — PANTOPRAZOLE SOD DR 40 MG T: 40 | 90 days supply | Qty: 90 | Fill #3

## 2017-03-01 MED FILL — FLUTICASONE PROP 50 MCG SPR: 50 | 30 days supply | Qty: 16 | Fill #4

## 2017-03-01 MED FILL — MONTELUKAST SOD 10 MG TAB: 10 | 90 days supply | Qty: 90 | Fill #2

## 2017-05-03 MED FILL — CLOMIPHENE CITRATE 50 MG TA: 50 | 30 days supply | Qty: 30 | Fill #1

## 2017-05-11 MED FILL — DOXYCYCLINE HYC 20 MG TAB: 20 | 90 days supply | Qty: 180 | Fill #0

## 2017-05-19 ENCOUNTER — Other Ambulatory Visit: Payer: Self-pay | Admitting: Internal Medicine

## 2017-05-19 DIAGNOSIS — K449 Diaphragmatic hernia without obstruction or gangrene: Principal | ICD-10-CM

## 2017-05-19 DIAGNOSIS — K219 Gastro-esophageal reflux disease without esophagitis: Secondary | ICD-10-CM

## 2017-05-19 NOTE — Telephone Encounter (Signed)
Routing to patients PCP, this Rx was previously filled by another CMA that used to be in our office by one of our doctors. I am routing it to you this time. Please advise. thanks

## 2017-05-20 ENCOUNTER — Encounter: Payer: Self-pay | Admitting: Internal Medicine

## 2017-05-20 NOTE — Telephone Encounter (Signed)
Called pt - when asked about continue care here at the clinic, the phone went dead. Called back x 2 - went to VM.

## 2017-05-20 NOTE — Telephone Encounter (Signed)
Sergio [name] no showed an appointment and subsequently failed to respond to a letter requesting he schedule an appointment.  Please move to the next step, per clinic policy.  I will not renew this medication unless he is seen in clinic or has a scheduled appointment with me scheduled within the next 3 months.  If he is no longer a patient of ours please un-banner me as his PCP to accurately reflect who his PCP is and/or is not.

## 2017-06-08 DIAGNOSIS — E785 Hyperlipidemia, unspecified: Secondary | ICD-10-CM | POA: Diagnosis not present

## 2017-06-08 DIAGNOSIS — E559 Vitamin D deficiency, unspecified: Secondary | ICD-10-CM | POA: Diagnosis not present

## 2017-06-08 DIAGNOSIS — E291 Testicular hypofunction: Secondary | ICD-10-CM | POA: Diagnosis not present

## 2017-06-08 DIAGNOSIS — J45909 Unspecified asthma, uncomplicated: Secondary | ICD-10-CM | POA: Diagnosis not present

## 2017-06-08 MED FILL — PANTOPRAZOLE SOD DR 40 MG T: 40 | 90 days supply | Qty: 90 | Fill #0

## 2017-06-08 MED FILL — VENTOLIN HFA 90 MCG INHALER: 108 (90 BAS | 30 days supply | Qty: 18 | Fill #0

## 2017-06-08 MED FILL — FLUTICASONE PROP 50 MCG SPR: 50 | 30 days supply | Qty: 16 | Fill #0

## 2017-06-25 ENCOUNTER — Ambulatory Visit: Payer: 59 | Admitting: Internal Medicine

## 2017-06-28 MED FILL — MONTELUKAST SOD 10 MG TAB: 10 | 90 days supply | Qty: 90 | Fill #3

## 2017-07-06 ENCOUNTER — Encounter: Payer: Self-pay | Admitting: Internal Medicine

## 2017-08-26 MED FILL — FLUTICASONE PROP 50 MCG SPR: 50 | 30 days supply | Qty: 16 | Fill #0

## 2017-09-06 MED FILL — CLOMIPHENE CITRATE 50 MG TA: 50 | 30 days supply | Qty: 30 | Fill #2

## 2017-10-04 ENCOUNTER — Other Ambulatory Visit: Payer: Self-pay | Admitting: Internal Medicine

## 2017-10-04 DIAGNOSIS — J4520 Mild intermittent asthma, uncomplicated: Secondary | ICD-10-CM

## 2017-10-04 MED FILL — PANTOPRAZOLE SOD DR 40 MG T: 40 | 30 days supply | Qty: 30 | Fill #1

## 2017-10-22 ENCOUNTER — Other Ambulatory Visit: Payer: Self-pay | Admitting: Internal Medicine

## 2017-10-22 DIAGNOSIS — J4520 Mild intermittent asthma, uncomplicated: Secondary | ICD-10-CM

## 2017-10-29 MED FILL — CLOMIPHENE CITRATE 50 MG TA: 50 | 30 days supply | Qty: 30 | Fill #0

## 2017-11-02 MED FILL — FLUTICASONE PROP 50 MCG SPR: 50 | 30 days supply | Qty: 16 | Fill #1

## 2017-11-08 MED FILL — PANTOPRAZOLE SOD DR 40 MG T: 40 | 30 days supply | Qty: 30 | Fill #0

## 2017-11-11 DIAGNOSIS — J45909 Unspecified asthma, uncomplicated: Secondary | ICD-10-CM | POA: Diagnosis not present

## 2017-11-11 DIAGNOSIS — Z0000 Encounter for general adult medical examination without abnormal findings: Secondary | ICD-10-CM | POA: Diagnosis not present

## 2017-11-11 DIAGNOSIS — R5383 Other fatigue: Secondary | ICD-10-CM | POA: Diagnosis not present

## 2017-11-11 DIAGNOSIS — E559 Vitamin D deficiency, unspecified: Secondary | ICD-10-CM | POA: Diagnosis not present

## 2017-11-11 DIAGNOSIS — E785 Hyperlipidemia, unspecified: Secondary | ICD-10-CM | POA: Diagnosis not present

## 2017-11-11 DIAGNOSIS — E291 Testicular hypofunction: Secondary | ICD-10-CM | POA: Diagnosis not present

## 2017-11-11 DIAGNOSIS — Z0389 Encounter for observation for other suspected diseases and conditions ruled out: Secondary | ICD-10-CM | POA: Diagnosis not present

## 2017-11-11 MED FILL — MONTELUKAST SOD 10 MG TAB: 10 | 90 days supply | Qty: 90 | Fill #0

## 2017-11-16 MED FILL — ARMOUR THYROID 30 MG TABLET: 30 | 30 days supply | Qty: 30 | Fill #0

## 2017-11-27 DIAGNOSIS — H52223 Regular astigmatism, bilateral: Secondary | ICD-10-CM | POA: Diagnosis not present

## 2017-12-03 MED FILL — CLOMIPHENE CITRATE 50 MG TA: 50 | 30 days supply | Qty: 30 | Fill #1

## 2017-12-30 DIAGNOSIS — E291 Testicular hypofunction: Secondary | ICD-10-CM | POA: Diagnosis not present

## 2017-12-30 DIAGNOSIS — E669 Obesity, unspecified: Secondary | ICD-10-CM | POA: Diagnosis not present

## 2017-12-30 DIAGNOSIS — R5383 Other fatigue: Secondary | ICD-10-CM | POA: Diagnosis not present

## 2017-12-30 DIAGNOSIS — L819 Disorder of pigmentation, unspecified: Secondary | ICD-10-CM | POA: Diagnosis not present

## 2017-12-31 MED FILL — ARMOUR THYROID 30 MG TABLET: 30 | 30 days supply | Qty: 60 | Fill #0

## 2018-01-01 MED FILL — PANTOPRAZOLE SOD DR 40 MG T: 40 | 30 days supply | Qty: 30 | Fill #1

## 2018-02-18 MED FILL — FLUTICASONE PROP 50 MCG SPR: 50 | 30 days supply | Qty: 16 | Fill #2

## 2018-02-18 MED FILL — MONTELUKAST SOD 10 MG TAB: 10 | 90 days supply | Qty: 90 | Fill #1

## 2018-02-18 MED FILL — PANTOPRAZOLE SOD DR 40 MG T: 40 | 30 days supply | Qty: 30 | Fill #2

## 2018-03-28 MED FILL — PANTOPRAZOLE SOD DR 40 MG T: 40 | 30 days supply | Qty: 30 | Fill #0

## 2018-04-20 DIAGNOSIS — E291 Testicular hypofunction: Secondary | ICD-10-CM | POA: Diagnosis not present

## 2018-04-20 DIAGNOSIS — E669 Obesity, unspecified: Secondary | ICD-10-CM | POA: Diagnosis not present

## 2018-04-20 DIAGNOSIS — R5383 Other fatigue: Secondary | ICD-10-CM | POA: Diagnosis not present

## 2018-05-18 DIAGNOSIS — I160 Hypertensive urgency: Secondary | ICD-10-CM | POA: Diagnosis not present

## 2018-05-18 DIAGNOSIS — R5383 Other fatigue: Secondary | ICD-10-CM | POA: Diagnosis not present

## 2018-05-23 DIAGNOSIS — I160 Hypertensive urgency: Secondary | ICD-10-CM | POA: Diagnosis not present

## 2018-06-09 DIAGNOSIS — I160 Hypertensive urgency: Secondary | ICD-10-CM | POA: Diagnosis not present

## 2018-06-09 DIAGNOSIS — R05 Cough: Secondary | ICD-10-CM | POA: Diagnosis not present

## 2018-06-20 DIAGNOSIS — I160 Hypertensive urgency: Secondary | ICD-10-CM | POA: Diagnosis not present

## 2018-06-27 MED FILL — MONTELUKAST SOD 10 MG TAB: 10 | 90 days supply | Qty: 90 | Fill #0

## 2018-07-08 MED FILL — PANTOPRAZOLE SOD DR 40 MG T: 40 | 30 days supply | Qty: 30 | Fill #1

## 2018-07-08 MED FILL — FLUTICASONE PROP 50 MCG SPR: 50 | 30 days supply | Qty: 16 | Fill #0

## 2018-08-31 MED FILL — PANTOPRAZOLE SOD DR 40 MG T: 40 | 30 days supply | Qty: 30 | Fill #2

## 2018-09-28 DIAGNOSIS — G44009 Cluster headache syndrome, unspecified, not intractable: Secondary | ICD-10-CM | POA: Diagnosis not present

## 2018-09-28 DIAGNOSIS — I10 Essential (primary) hypertension: Secondary | ICD-10-CM | POA: Diagnosis not present

## 2018-10-17 DIAGNOSIS — I10 Essential (primary) hypertension: Secondary | ICD-10-CM | POA: Diagnosis not present

## 2018-10-17 DIAGNOSIS — R21 Rash and other nonspecific skin eruption: Secondary | ICD-10-CM | POA: Diagnosis not present

## 2018-10-17 MED FILL — LOSARTAN-HCTZ 100-25 MG TAB: 100-25 | 30 days supply | Qty: 30 | Fill #0

## 2018-10-19 ENCOUNTER — Encounter: Payer: Self-pay | Admitting: Neurology

## 2018-10-19 ENCOUNTER — Ambulatory Visit: Payer: 59 | Admitting: Neurology

## 2018-10-19 VITALS — BP 141/90 | HR 61 | Ht 70.0 in | Wt 228.0 lb

## 2018-10-19 DIAGNOSIS — G43709 Chronic migraine without aura, not intractable, without status migrainosus: Secondary | ICD-10-CM | POA: Diagnosis not present

## 2018-10-19 DIAGNOSIS — G43909 Migraine, unspecified, not intractable, without status migrainosus: Secondary | ICD-10-CM | POA: Insufficient documentation

## 2018-10-19 DIAGNOSIS — IMO0002 Reserved for concepts with insufficient information to code with codable children: Secondary | ICD-10-CM

## 2018-10-19 MED ORDER — SUMATRIPTAN SUCCINATE 50 MG PO TABS: 50.0000 mg | ORAL_TABLET | ORAL | 5 refills | Status: DC | PRN

## 2018-10-19 MED FILL — SUMATRIPTAN SUCC 50 MG TAB: 50 | 30 days supply | Qty: 10 | Fill #0

## 2018-10-19 NOTE — Progress Notes (Signed)
PATIENT: Sergio [name] DOB: July 26, 1976  Chief Complaint  Patient presents with  . Headache    He will have a cluster of headaches every 1-2 months, lasting at least 4-5 days.  He does not use anything for pain. The pain is typically sharp and travels down the left side of his head.   Marland Kitchen PCP    Doree Albee, MD     HISTORICAL  Sergio [name] is a 43 year old male, seen in request by his primary care doctor Sergio [name] for evaluation of left-sided headache initial evaluation was on October 19, 2018.  I have reviewed and summarized the referring note from the referring physician.  He had past medical history of obstructive sleep apnea using CPAP, GERD, hyperlipidemia,  Since 2010, he began to notice intermittent left side headache, always starting from sharp pains at left parietal region, intermittent pounding pain can last for 4 to 5 days, it happened every 2 to 3 months, movement made it worse, he denies significant light noise sensitivity, no nausea, he has tried over-the-counter Tylenol NSAIDs without significant help now he just to suffer through.  He was not able to identify any triggers, there is no lateralized motor or sensory deficit.  Only has mild tearing on the left eye, no other significant autonomic phenomenon.    REVIEW OF SYSTEMS: Full 14 system review of systems performed and notable only for dizziness, headache murmur All other review of systems were negative.  ALLERGIES: Allergies  Allergen Reactions  . Other Hives    Tree nuts     HOME MEDICATIONS: Current Outpatient Medications  Medication Sig Dispense Refill  . albuterol (PROVENTIL HFA) 108 (90 BASE) MCG/ACT inhaler Inhale 2 puffs into the lungs every 6 (six) hours as needed for wheezing. 1 Inhaler 11  . cetirizine (ZYRTEC) 10 MG tablet Take 1 tablet (10 mg total) by mouth daily. 30 tablet 6  . diphenhydrAMINE (SOMINEX) 25 MG tablet 2 at bedtime for allergies and for sleep as needed     .  fluticasone (FLONASE) 50 MCG/ACT nasal spray INSTILL 1 TO 2 SPRAYS INTO BOTH NOSTRILS DAILY. 16 g 6  . montelukast (SINGULAIR) 10 MG tablet Take 1 tablet (10 mg total) by mouth at bedtime. 90 tablet 3  . pantoprazole (PROTONIX) 40 MG tablet Take 1 tablet (40 mg total) by mouth daily. 90 tablet 3  . simvastatin (ZOCOR) 20 MG tablet Take 1 tablet (20 mg total) by mouth daily at 6 PM. 90 tablet 3   No current facility-administered medications for this visit.     PAST MEDICAL HISTORY: Past Medical History:  Diagnosis Date  . Asthma with allergic rhinitis 03/11/2008  . Essential hypertension 05/06/2012  . Gastroesophageal reflux disease with hiatal hernia 05/06/2012  . Gastroesophageal reflux disease with hiatal hernia   . Hyperlipidemia LDL goal < 160 03/11/2008  . Irritable bowel syndrome with constipation and diarrhea 05/06/2012  . Migraine   . Obesity (BMI 30.0-34.9) 03/03/2013  . Obstructive sleep apnea 11/19/2008   Nocturnal polysomnography 12/14/2008: AHI 20.8, O2 Sat nadir 84%, optimal CPAP 12 cm H2O     PAST SURGICAL HISTORY: Past Surgical History:  Procedure Laterality Date  . URETHRAL DILATION      FAMILY HISTORY: Family History  Problem Relation Age of Onset  . Early death Father        Accident  . Hypertension Maternal Grandmother   . Vaginal cancer Maternal Grandmother   . Parkinson's disease Maternal Grandmother   .  Diabetes Paternal Grandfather   . ALS Paternal Grandfather   . Breast cancer Paternal Grandmother   . Kidney cancer Paternal Grandmother   . Healthy Mother   . Healthy Sister   . Healthy Sister   . Hypertension Maternal Uncle   . Diabetes Paternal Aunt   . COPD Maternal Grandfather     SOCIAL HISTORY: Social History   Socioeconomic History  . Marital status: Married    Spouse name: Not on file  . Number of children: 0  . Years of education: college  . Highest education level: Not on file  Occupational History  . Occupation: Medical Records    Social Needs  . Financial resource strain: Not on file  . Food insecurity:    Worry: Not on file    Inability: Not on file  . Transportation needs:    Medical: Not on file    Non-medical: Not on file  Tobacco Use  . Smoking status: Never Smoker  . Smokeless tobacco: Never Used  Substance and Sexual Activity  . Alcohol use: Yes    Comment: Socially  . Drug use: No  . Sexual activity: Yes    Birth control/protection: None  Lifestyle  . Physical activity:    Days per week: Not on file    Minutes per session: Not on file  . Stress: Not on file  Relationships  . Social connections:    Talks on phone: Not on file    Gets together: Not on file    Attends religious service: Not on file    Active member of club or organization: Not on file    Attends meetings of clubs or organizations: Not on file    Relationship status: Not on file  . Intimate partner violence:    Fear of current or ex partner: Not on file    Emotionally abused: Not on file    Physically abused: Not on file    Forced sexual activity: Not on file  Other Topics Concern  . Not on file  Social History Narrative   Works for [employer] in the [employer].  Married, no children.   Right-handed.   Occasionally caffeine use.     PHYSICAL EXAM   Vitals:   10/19/18 0845  BP: (!) 141/90  Pulse: 61  Weight: 228 lb (103.4 kg)  Height: 5\' 10"  (1.778 m)    Not recorded      Body mass index is 32.71 kg/m.  PHYSICAL EXAMNIATION:  Gen: NAD, conversant, well nourised, obese, well groomed                     Cardiovascular: Regular rate rhythm, no peripheral edema, warm, nontender. Eyes: Conjunctivae clear without exudates or hemorrhage Neck: Supple, no carotid bruits. Pulmonary: Clear to auscultation bilaterally   NEUROLOGICAL EXAM:  MENTAL STATUS: Speech:    Speech is normal; fluent and spontaneous with normal comprehension.  Cognition:     Orientation to time, place and person     Normal recent  and remote memory     Normal Attention span and concentration     Normal Language, naming, repeating,spontaneous speech     Fund of knowledge   CRANIAL NERVES: CN II: Visual fields are full to confrontation. Fundoscopic exam is normal with sharp discs and no vascular changes. Pupils are round equal and briskly reactive to light. CN III, IV, VI: extraocular movement are normal. No ptosis. CN V: Facial sensation is intact to pinprick in all  3 divisions bilaterally. Corneal responses are intact.  CN VII: Face is symmetric with normal eye closure and smile. CN VIII: Hearing is normal to rubbing fingers CN IX, X: Palate elevates symmetrically. Phonation is normal. CN XI: Head turning and shoulder shrug are intact CN XII: Tongue is midline with normal movements and no atrophy.  MOTOR: There is no pronator drift of out-stretched arms. Muscle bulk and tone are normal. Muscle strength is normal.  REFLEXES: Reflexes are 2+ and symmetric at the biceps, triceps, knees, and ankles. Plantar responses are flexor.  SENSORY: Intact to light touch, pinprick, positional sensation and vibratory sensation are intact in fingers and toes.  COORDINATION: Rapid alternating movements and fine finger movements are intact. There is no dysmetria on finger-to-nose and heel-knee-shin.    GAIT/STANCE: Posture is normal. Gait is steady with normal steps, base, arm swing, and turning. Heel and toe walking are normal. Tandem gait is normal.  Romberg is absent.   DIAGNOSTIC DATA (LABS, IMAGING, TESTING) - I reviewed patient records, labs, notes, testing and imaging myself where available.   ASSESSMENT AND PLAN  Sergio [name] is a 43 y.o. male   Recurrent left-sided headaches   Has some migraine features  Try Imitrex 50 mg as needed,  Return to clinic with Sergio [name] in 3 months  If Imitrex does not work, the other options are Maxalt, Relpax, may combine it with NSAIDs, even muscle relaxant   Marcial Pacas, M.D.  Ph.D.  [facility] [address] [place], [place] [address] Ph: [phone] Fax: [phone]  CC: Doree Albee, MD

## 2018-10-24 MED FILL — PANTOPRAZOLE SOD DR 40 MG T: 40 | 30 days supply | Qty: 30 | Fill #0

## 2018-10-24 MED FILL — MONTELUKAST SOD 10 MG TAB: 10 | 90 days supply | Qty: 90 | Fill #0

## 2018-10-27 MED FILL — VENTOLIN HFA 90 MCG INHALER: 108 (90 BAS | 25 days supply | Qty: 18 | Fill #0

## 2018-10-27 MED FILL — FLUTICASONE PROP 50 MCG SPR: 50 | 30 days supply | Qty: 16 | Fill #0

## 2018-11-08 DIAGNOSIS — Z0000 Encounter for general adult medical examination without abnormal findings: Secondary | ICD-10-CM | POA: Diagnosis not present

## 2018-11-08 DIAGNOSIS — R21 Rash and other nonspecific skin eruption: Secondary | ICD-10-CM | POA: Diagnosis not present

## 2018-11-08 DIAGNOSIS — E559 Vitamin D deficiency, unspecified: Secondary | ICD-10-CM | POA: Diagnosis not present

## 2018-11-08 DIAGNOSIS — M545 Low back pain: Secondary | ICD-10-CM | POA: Diagnosis not present

## 2018-11-08 DIAGNOSIS — Z131 Encounter for screening for diabetes mellitus: Secondary | ICD-10-CM | POA: Diagnosis not present

## 2018-11-08 DIAGNOSIS — I10 Essential (primary) hypertension: Secondary | ICD-10-CM | POA: Diagnosis not present

## 2018-11-08 DIAGNOSIS — Z0001 Encounter for general adult medical examination with abnormal findings: Secondary | ICD-10-CM | POA: Diagnosis not present

## 2018-11-08 DIAGNOSIS — R5383 Other fatigue: Secondary | ICD-10-CM | POA: Diagnosis not present

## 2018-11-08 DIAGNOSIS — G4730 Sleep apnea, unspecified: Secondary | ICD-10-CM | POA: Diagnosis not present

## 2018-11-08 DIAGNOSIS — Z139 Encounter for screening, unspecified: Secondary | ICD-10-CM | POA: Diagnosis not present

## 2018-11-08 DIAGNOSIS — E785 Hyperlipidemia, unspecified: Secondary | ICD-10-CM | POA: Diagnosis not present

## 2018-11-21 DIAGNOSIS — B350 Tinea barbae and tinea capitis: Secondary | ICD-10-CM | POA: Diagnosis not present

## 2018-11-21 DIAGNOSIS — D229 Melanocytic nevi, unspecified: Secondary | ICD-10-CM | POA: Diagnosis not present

## 2018-12-06 MED FILL — LOSARTAN-HCTZ 100-25 MG TAB: 100-25 | 30 days supply | Qty: 30 | Fill #1

## 2018-12-15 ENCOUNTER — Ambulatory Visit (INDEPENDENT_AMBULATORY_CARE_PROVIDER_SITE_OTHER): Payer: 59

## 2018-12-26 MED FILL — VENTOLIN HFA 90 MCG INHALER: 108 (90 BAS | 25 days supply | Qty: 18 | Fill #1

## 2018-12-26 MED FILL — FLUTICASONE PROP 50 MCG SPR: 50 | 30 days supply | Qty: 16 | Fill #1

## 2018-12-28 MED FILL — PANTOPRAZOLE SOD DR 40 MG T: 40 | 30 days supply | Qty: 30 | Fill #1

## 2018-12-29 ENCOUNTER — Ambulatory Visit (INDEPENDENT_AMBULATORY_CARE_PROVIDER_SITE_OTHER): Payer: 59

## 2019-01-03 ENCOUNTER — Ambulatory Visit (INDEPENDENT_AMBULATORY_CARE_PROVIDER_SITE_OTHER): Payer: 59

## 2019-01-05 ENCOUNTER — Ambulatory Visit (INDEPENDENT_AMBULATORY_CARE_PROVIDER_SITE_OTHER): Payer: 59

## 2019-01-10 ENCOUNTER — Ambulatory Visit (INDEPENDENT_AMBULATORY_CARE_PROVIDER_SITE_OTHER): Payer: 59

## 2019-01-11 ENCOUNTER — Ambulatory Visit: Payer: 59 | Admitting: Allergy & Immunology

## 2019-01-25 MED FILL — LOSARTAN-HCTZ 100-25 MG TAB: 100-25 | 30 days supply | Qty: 30 | Fill #2

## 2019-01-30 ENCOUNTER — Telehealth: Payer: Self-pay

## 2019-01-30 NOTE — Telephone Encounter (Signed)
Unable to get in contact with the patient to convert their office visit with Sergio [name] on 01/30/2019 into a doxy.me visit. I left a voicemail asking the patient to return my call. Office number was provided.   If patient calls back please convert their office visit into a doxy.me visit.

## 2019-02-01 ENCOUNTER — Telehealth: Payer: Self-pay | Admitting: Neurology

## 2019-02-01 ENCOUNTER — Ambulatory Visit: Payer: 59 | Admitting: Neurology

## 2019-02-01 NOTE — Telephone Encounter (Signed)
I called the patient to confirm his appointment today at 10:15 02/01/19. I left a message asking him to call back to convert his visit to a doxy.me

## 2019-02-15 ENCOUNTER — Ambulatory Visit: Payer: 59 | Admitting: Allergy & Immunology

## 2019-02-20 MED FILL — FLUTICASONE PROP 50 MCG SPR: 50 | 30 days supply | Qty: 16 | Fill #2

## 2019-02-20 MED FILL — MONTELUKAST SOD 10 MG TAB: 10 | 90 days supply | Qty: 90 | Fill #0

## 2019-03-06 MED FILL — PANTOPRAZOLE SOD DR 40 MG T: 40 | 30 days supply | Qty: 30 | Fill #2

## 2019-03-07 MED FILL — SUMATRIPTAN SUCC 50 MG TAB: 50 | 30 days supply | Qty: 10 | Fill #1

## 2019-03-13 MED FILL — LOSARTAN-HCTZ 100-25 MG TAB: 100-25 | 30 days supply | Qty: 30 | Fill #0

## 2019-04-11 MED FILL — LOSARTAN-HCTZ 100-25 MG TAB: 100-25 | 30 days supply | Qty: 30 | Fill #0

## 2019-04-11 MED FILL — TESTOSTERONE CYP 200 MG/ML: 200 | 70 days supply | Qty: 10 | Fill #0

## 2019-04-19 ENCOUNTER — Ambulatory Visit: Payer: 59 | Admitting: Allergy & Immunology

## 2019-05-04 MED FILL — PANTOPRAZOLE SOD DR 40 MG T: 40 | 30 days supply | Qty: 30 | Fill #0

## 2019-05-04 MED FILL — LOSARTAN-HCTZ 100-25 MG TAB: 100-25 | 30 days supply | Qty: 30 | Fill #1

## 2019-05-04 MED FILL — FLUTICASONE PROP 50 MCG SPR: 50 | 30 days supply | Qty: 16 | Fill #3

## 2019-05-09 DIAGNOSIS — E291 Testicular hypofunction: Secondary | ICD-10-CM | POA: Diagnosis not present

## 2019-05-09 DIAGNOSIS — E669 Obesity, unspecified: Secondary | ICD-10-CM | POA: Diagnosis not present

## 2019-05-09 DIAGNOSIS — I10 Essential (primary) hypertension: Secondary | ICD-10-CM | POA: Diagnosis not present

## 2019-05-09 DIAGNOSIS — E559 Vitamin D deficiency, unspecified: Secondary | ICD-10-CM | POA: Diagnosis not present

## 2019-05-12 ENCOUNTER — Ambulatory Visit: Payer: 59 | Admitting: Allergy & Immunology

## 2019-05-19 ENCOUNTER — Encounter: Payer: Self-pay | Admitting: Allergy & Immunology

## 2019-05-19 ENCOUNTER — Ambulatory Visit (INDEPENDENT_AMBULATORY_CARE_PROVIDER_SITE_OTHER): Payer: 59 | Admitting: Allergy & Immunology

## 2019-05-19 ENCOUNTER — Other Ambulatory Visit: Payer: Self-pay

## 2019-05-19 VITALS — BP 124/78 | HR 100 | Temp 98.0°F | Resp 16 | Ht 69.5 in | Wt 219.0 lb

## 2019-05-19 DIAGNOSIS — T7800XD Anaphylactic reaction due to unspecified food, subsequent encounter: Secondary | ICD-10-CM

## 2019-05-19 DIAGNOSIS — J310 Chronic rhinitis: Secondary | ICD-10-CM | POA: Diagnosis not present

## 2019-05-19 DIAGNOSIS — J4520 Mild intermittent asthma, uncomplicated: Secondary | ICD-10-CM | POA: Diagnosis not present

## 2019-05-19 DIAGNOSIS — J4540 Moderate persistent asthma, uncomplicated: Secondary | ICD-10-CM

## 2019-05-19 MED ORDER — BUDESONIDE-FORMOTEROL FUMARATE 80-4.5 MCG/ACT IN AERO: 2.0000 | INHALATION_SPRAY | Freq: Two times a day (BID) | RESPIRATORY_TRACT | 5 refills | Status: DC

## 2019-05-19 MED ORDER — LEVALBUTEROL TARTRATE 45 MCG/ACT IN AERO: INHALATION_SPRAY | RESPIRATORY_TRACT | 1 refills | Status: DC

## 2019-05-19 MED ORDER — EPINEPHRINE 0.3 MG/0.3ML IJ SOAJ: 0.3000 mg | INTRAMUSCULAR | 1 refills | Status: DC | PRN

## 2019-05-19 MED FILL — LEVALBUTEROL TAR HFA 45MCG: 45 | 8 days supply | Qty: 15 | Fill #0

## 2019-05-19 MED FILL — SYMBICORT 80-4.5 MCG INH: 80-4.5 | 30 days supply | Qty: 10 | Fill #0

## 2019-05-19 MED FILL — EPINEPHRINE 0.3 MG AUTO-INJ: 0.3 | 30 days supply | Qty: 2 | Fill #0

## 2019-05-19 NOTE — Patient Instructions (Addendum)
1. Moderate persistent asthma, uncomplicated - Lung testing looked fairly normal today. - Because you are coughing often, we are going to add Symbicort (contains a long acting albuterol and a long acting steroid). - This should allow you to avoid the need for your rescue inhaler and should decrease the coughing at night and during the day. - Spacer sample and demonstration provided. - Daily controller medication(s): Singulair 10mg  daily and Symbicort 80/4.43mcg two puffs twice daily with spacer - Prior to physical activity: Xopenex 2 puffs 10-15 minutes before physical activity. - Rescue medications: Xopenex 4 puffs every 4-6 hours as needed - Asthma control goals:  * Full participation in all desired activities (may need albuterol before activity) * Albuterol use two time or less a week on average (not counting use with activity) * Cough interfering with sleep two time or less a month * Oral steroids no more than once a year * No hospitalizations  2. Chronic rhinitis - Histamine was non-reactive today, so we will see you on Tuesday for skin testing at 8am at the [facility] office. - We can make further recommendations at time.  - Hold your antihistamines over the long weekend. - You may continue with Singulair and Flonase.  3. Anaphylactic shock due to food - We will test for tree nuts on Tuesday. - EpiPen training provided. - Anaphylaxis management plan provided.   4. Return in about 4 days (around 05/23/2019). This can be an in-person, a virtual Webex or a telephone follow up visit.   Please inform us of any Emergency Department visits, hospitalizations, or changes in symptoms. Call us before going to the ED for breathing or allergy symptoms since we might be able to fit you in for a sick visit. Feel free to contact us anytime with any questions, problems, or concerns.  It was a pleasure to meet you today!  Websites that have reliable patient information: 1. American Academy of  Asthma, Allergy, and Immunology: www.aaaai.org 2. Food Allergy Research and Education (FARE): foodallergy.org 3. Mothers of Asthmatics: http://www.asthmacommunitynetwork.org 4. American College of Allergy, Asthma, and Immunology: www.acaai.org  "Like" [place] on Facebook and Instagram for our latest updates!      Make sure you are registered to vote! If you have moved or changed any of your contact information, you will need to get this updated before voting!  In some cases, you MAY be able to register to vote online: [email or web address]    Voter ID laws are NOT going into effect for the General Election in November 2020! DO NOT let this stop you from exercising your right to vote!   Absentee voting is the SAFEST way to vote during the coronavirus pandemic!   Download and print an absentee ballot request form at [email or web address] or you can scan the QR code below with your smart phone:      More information on absentee ballots can be found here: [email or web address]

## 2019-05-19 NOTE — Progress Notes (Signed)
NEW PATIENT  Date of Service/Encounter:  05/19/19  Referring provider: Doree Albee, MD   Assessment:   Moderate persistent asthma, uncomplicated   Chronic rhinitis - with non reactive histamine today (coming back next week for environmental allergy testing)  Anaphylactic shock due to food (tree nuts)  Plan/Recommendations:   1. Moderate persistent asthma, uncomplicated - Lung testing looked fairly normal today. - Because you are coughing often, we are going to add Symbicort (contains a long acting albuterol and a long acting steroid). - This should allow you to avoid the need for your rescue inhaler and should decrease the coughing at night and during the day. - Spacer sample and demonstration provided. - Daily controller medication(s): Singulair 10mg  daily and Symbicort 80/4.85mcg two puffs twice daily with spacer - Prior to physical activity: Xopenex 2 puffs 10-15 minutes before physical activity. - Rescue medications: Xopenex 4 puffs every 4-6 hours as needed - Asthma control goals:  * Full participation in all desired activities (may need albuterol before activity) * Albuterol use two time or less a week on average (not counting use with activity) * Cough interfering with sleep two time or less a month * Oral steroids no more than once a year * No hospitalizations  2. Chronic rhinitis - Histamine was non-reactive today, so we will see you on Tuesday for skin testing at 8am at the [facility] office. - We can make further recommendations at time.  - Hold your antihistamines over the long weekend. - You may continue with Singulair and Flonase.  3. Anaphylactic shock due to food - We will test for tree nuts on Tuesday. - EpiPen training provided. - Anaphylaxis management plan provided.   4. Return in about 4 days (around 05/23/2019). This can be an in-person, a virtual Webex or a telephone follow up visit.  Subjective:   Sergio [name] is a 43 y.o. male  presenting today for evaluation of  Chief Complaint  Patient presents with  . Urticaria    Previously a dr. Annamaria Boots patient. He currently takes Benadryl, Zyrtec and Singulair daily. If he misses any doses he develops hives. Never knew cause of hives but the medications do control symptoms unless he misses them.    . Cough    coughs after he eats. this does seem to be worsening.   . Asthma    diagnosed by Dr. Annamaria Boots with allergy induced asthma. He does not take a daily controller medication, just the albuterol if needed.     Sergio [name] has a history of the following: Patient Active Problem List   Diagnosis Date Noted  . Chronic migraine 10/19/2018  . Healthcare maintenance 05/13/2015  . Pain of left thumb 04/12/2015  . Obesity (BMI 30.0-34.9) 03/03/2013  . Gastroesophageal reflux disease with hiatal hernia 05/06/2012  . Essential hypertension 05/06/2012  . Irritable bowel syndrome with constipation and diarrhea 05/06/2012  . Obstructive sleep apnea 11/19/2008  . Hyperlipidemia 03/11/2008  . Asthma with allergic rhinitis 03/11/2008    History obtained from: chart review and patient.  Sergio [name] was referred by Doree Albee, MD.     Isham is a 43 y.o. male presenting for an evaluation of asthma and allergies.  Asthma/Respiratory Symptom History: He was first diagnosed with asthma around 15-20 years ago. For his asthma he had been on Symbicort, Singulair and PRN Albuterol (for primarily exercise and allergy induced asthma) until 2 years ago when he stopped taking Symbicort. He states his asthma is typically  well controlled if his allergies are controlled and he is not exerting himself. He has started working out with a trainer in the past 2 years (since stopping Symbicort) and has noticed mild to moderate asthma flares during exercise, characterized by sob, wheezing, and some light-headedness. He has tried taking albuterol before working out, which has helped, but he will  still get symptomatic after 30-45 minutes. He has some increased sough for the past year at night and during the day (with daytime trigger typically being spicey food in the setting of GERD on a PPI).  Allergic Rhinitis Symptom History: His allergy history consists of allergy testing when he was seen about 15 years ago by Dr. Annamaria Boots, which was positive to many things per patient, but he cannot remember what. He does report symptoms around dust and other unknown allergens. These reactions consist of rhinorrhea and urticaria at times. He takes Singulair, Zyrtec, Benadryl, and Flonase (Flonase started more recently) He states his symptoms are well controlled at baseline with only mild watery itchy eye and some rhinorrhea. This flares up in the spring and fall. He had tried some immunotherapy 15 years ago but stopped due to issues with cost at the time (he cannot remember to what extend the shot helped his symptoms).   Food Allergy Symptom History: He has food allergy to tree nuts and avoids this. His reactions in the past have consisted of itchiness, rash and some facial swelling. He has no history of difficulty breathing. He does not currently have an epinephrine autoinjector. He tolerates peanuts without a problem. His first reaction was when he was eating a cheeseball with pecans over the top of it. Then he reacted again when he had a pie with a crust containing walnuts. He has never been tested officially for this.   Otherwise, there is no history of other atopic diseases, including asthma, drug allergies, stinging insect allergies, eczema, urticaria or contact dermatitis. There is no significant infectious history. Vaccinations are up to date.    Past Medical History: Patient Active Problem List   Diagnosis Date Noted  . Chronic migraine 10/19/2018  . Healthcare maintenance 05/13/2015  . Pain of left thumb 04/12/2015  . Obesity (BMI 30.0-34.9) 03/03/2013  . Gastroesophageal reflux disease with  hiatal hernia 05/06/2012  . Essential hypertension 05/06/2012  . Irritable bowel syndrome with constipation and diarrhea 05/06/2012  . Obstructive sleep apnea 11/19/2008  . Hyperlipidemia 03/11/2008  . Asthma with allergic rhinitis 03/11/2008    Medication List:  Allergies as of 05/19/2019      Reactions   Other Hives   Tree nuts       Medication List       Accurate as of May 19, 2019 10:56 AM. If you have any questions, ask your nurse or doctor.        albuterol 108 (90 Base) MCG/ACT inhaler Commonly known as: Proventil HFA Inhale 2 puffs into the lungs every 6 (six) hours as needed for wheezing.   budesonide-formoterol 80-4.5 MCG/ACT inhaler Commonly known as: Symbicort Inhale 2 puffs into the lungs 2 (two) times daily. Started by: Valentina Shaggy, MD   cetirizine 10 MG tablet Commonly known as: ZYRTEC Take 1 tablet (10 mg total) by mouth daily.   cholecalciferol 25 MCG (1000 UT) tablet Commonly known as: VITAMIN D3 Take 5,000 Units by mouth daily.   diphenhydrAMINE 25 MG tablet Commonly known as: SOMINEX 2 at bedtime for allergies and for sleep as needed   fluticasone 50 MCG/ACT nasal  spray Commonly known as: FLONASE INSTILL 1 TO 2 SPRAYS INTO BOTH NOSTRILS DAILY.   losartan-hydrochlorothiazide 100-25 MG tablet Commonly known as: HYZAAR Take 1 tablet by mouth daily.   montelukast 10 MG tablet Commonly known as: SINGULAIR Take 1 tablet (10 mg total) by mouth at bedtime.   NP Thyroid 60 MG tablet Generic drug: thyroid Take 60 mg by mouth daily.   pantoprazole 40 MG tablet Commonly known as: PROTONIX Take 1 tablet (40 mg total) by mouth daily.   simvastatin 20 MG tablet Commonly known as: ZOCOR Take 1 tablet (20 mg total) by mouth daily at 6 PM.   SUMAtriptan 50 MG tablet Commonly known as: Imitrex Take 1 tablet (50 mg total) by mouth every 2 (two) hours as needed for migraine. May repeat in 2 hours if headache persists or recurs.        Birth History: non-contributory  Developmental History: non-contributory  Past Surgical History: Past Surgical History:  Procedure Laterality Date  . URETHRAL DILATION       Family History: Family History  Problem Relation Age of Onset  . Early death Father        Accident  . Hypertension Maternal Grandmother   . Vaginal cancer Maternal Grandmother   . Parkinson's disease Maternal Grandmother   . Diabetes Paternal Grandfather   . ALS Paternal Grandfather   . Breast cancer Paternal Grandmother   . Kidney cancer Paternal Grandmother   . Healthy Mother   . Healthy Sister   . Healthy Sister   . Hypertension Maternal Uncle   . Diabetes Paternal Aunt   . COPD Maternal Grandfather   . Urticaria Neg Hx   . Immunodeficiency Neg Hx   . Eczema Neg Hx   . Atopy Neg Hx   . Angioedema Neg Hx   . Allergic rhinitis Neg Hx   . Asthma Neg Hx      Social History: Dennis lives at home with his family. They live in a house that was built in 1980. There is wood throughout the home.  Review of Systems  Constitutional: Negative.  Negative for fever, malaise/fatigue and weight loss.  HENT: Negative.  Negative for congestion, ear discharge, ear pain and sinus pain.        Positive for postnasal drip.  Eyes: Negative for pain, discharge and redness.  Respiratory: Positive for cough and shortness of breath. Negative for sputum production and wheezing.   Cardiovascular: Negative.  Negative for chest pain and palpitations.  Gastrointestinal: Negative for abdominal pain, heartburn, nausea and vomiting.  Skin: Negative.  Negative for itching and rash.  Neurological: Negative for dizziness and headaches.  Endo/Heme/Allergies: Negative for environmental allergies. Does not bruise/bleed easily.       Objective:   Blood pressure 124/78, pulse 100, temperature 98 F (36.7 C), temperature source Temporal, resp. rate 16, height 5' 9.5" (1.765 m), weight 219 lb (99.3 kg), SpO2 98 %. Body mass  index is 31.88 kg/m.   Physical Exam:   Physical Exam  Constitutional: He appears well-developed.  HENT:  Head: Normocephalic and atraumatic.  Right Ear: Tympanic membrane, external ear and ear canal normal. No drainage, swelling or tenderness. Tympanic membrane is not injected, not scarred, not erythematous, not retracted and not bulging.  Left Ear: Tympanic membrane, external ear and ear canal normal. No drainage, swelling or tenderness. Tympanic membrane is not injected, not scarred, not erythematous, not retracted and not bulging.  Nose: No mucosal edema, rhinorrhea, nasal deformity or septal deviation. No epistaxis.  Right sinus exhibits no maxillary sinus tenderness and no frontal sinus tenderness. Left sinus exhibits no maxillary sinus tenderness and no frontal sinus tenderness.  Mouth/Throat: Uvula is midline and oropharynx is clear and moist. Mucous membranes are not pale and not dry.  There is some clear rhinorrhea noted with bilaterally edematous turbinates.   Eyes: Pupils are equal, round, and reactive to light. Conjunctivae and EOM are normal. Right eye exhibits no chemosis and no discharge. Left eye exhibits no chemosis and no discharge. Right conjunctiva is not injected. Left conjunctiva is not injected.  Cardiovascular: Normal rate, regular rhythm and normal heart sounds.  Respiratory: Effort normal and breath sounds normal. No accessory muscle usage. No tachypnea. No respiratory distress. He has no wheezes. He has no rhonchi. He has no rales. He exhibits no tenderness.  Moving air well in all lung fields.   GI: There is no abdominal tenderness. There is no rebound and no guarding.  Lymphadenopathy:       Head (right side): No submandibular, no tonsillar and no occipital adenopathy present.       Head (left side): No submandibular, no tonsillar and no occipital adenopathy present.    He has no cervical adenopathy.  Neurological: He is alert.  Skin: No abrasion, no petechiae  and no rash noted. Rash is not papular, not vesicular and not urticarial. No erythema. No pallor.  No urticarial or eczematous lesions noted.   Psychiatric: He has a normal mood and affect.     Diagnostic studies:    Spirometry: results normal (FEV1: 3.54/95%, FVC: 4.03/80%, FEV1/FVC: 87%).    Spirometry consistent with normal pattern. Four puffs of albuterol treatment given in clinic with no improvement. However he did have symptomatic improvement.   Allergy Studies: none (deferred due to recent antihistamine use)       Salvatore Marvel, MD [facility]

## 2019-05-23 ENCOUNTER — Telehealth: Payer: Self-pay | Admitting: Allergy & Immunology

## 2019-05-23 ENCOUNTER — Telehealth: Payer: Self-pay

## 2019-05-23 ENCOUNTER — Ambulatory Visit (INDEPENDENT_AMBULATORY_CARE_PROVIDER_SITE_OTHER): Payer: 59 | Admitting: Allergy & Immunology

## 2019-05-23 ENCOUNTER — Other Ambulatory Visit: Payer: Self-pay

## 2019-05-23 ENCOUNTER — Encounter: Payer: Self-pay | Admitting: Allergy & Immunology

## 2019-05-23 DIAGNOSIS — J302 Other seasonal allergic rhinitis: Secondary | ICD-10-CM

## 2019-05-23 DIAGNOSIS — T7800XD Anaphylactic reaction due to unspecified food, subsequent encounter: Secondary | ICD-10-CM

## 2019-05-23 DIAGNOSIS — J3089 Other allergic rhinitis: Secondary | ICD-10-CM | POA: Diagnosis not present

## 2019-05-23 DIAGNOSIS — J4540 Moderate persistent asthma, uncomplicated: Secondary | ICD-10-CM | POA: Insufficient documentation

## 2019-05-23 DIAGNOSIS — T7800XA Anaphylactic reaction due to unspecified food, initial encounter: Secondary | ICD-10-CM | POA: Insufficient documentation

## 2019-05-23 MED ORDER — AZELASTINE HCL 0.1 % NA SOLN: 2.0000 | Freq: Two times a day (BID) | NASAL | 5 refills | Status: DC

## 2019-05-23 MED FILL — AZELASTINE HCL 137 MCG SPRY: 0.1 | 25 days supply | Qty: 30 | Fill #0

## 2019-05-23 NOTE — Progress Notes (Signed)
FOLLOW UP  Date of Service/Encounter:  05/23/19   Assessment:   Moderate persistent asthma, uncomplicated   Seasonal and perennial allergic rhinitis (grasses, ragweed, weeds, trees, indoor molds, outdoor molds and cat)  Anaphylactic shock due to food (tree nuts) - getting blood work to confirm this  Plan/Recommendations:   1. Moderate persistent asthma, uncomplicated - I am glad that the Symbicort is working so well for you!  - Daily controller medication(s): Singulair 10mg  daily and Symbicort 80/4.86mcg two puffs twice daily with spacer - Prior to physical activity: Xopenex 2 puffs 10-15 minutes before physical activity. - Rescue medications: Xopenex 4 puffs every 4-6 hours as needed - Asthma control goals:  * Full participation in all desired activities (may need albuterol before activity) * Albuterol use two time or less a week on average (not counting use with activity) * Cough interfering with sleep two time or less a month * Oral steroids no more than once a year * No hospitalizations  2. Chronic rhinitis - Testing today showed: grasses, ragweed, weeds, trees, indoor molds, outdoor molds and cat - Copy of test results provided.  - Avoidance measures provided. - Stop taking: Benadryl - Continue with: Singulair (montelukast) 10mg  daily and Flonase (fluticasone) two sprays per nostril daily - Start taking: Allegra (fexofenadine) 180mg  table once daily and Astelin (azelastine) 2 sprays per nostril 1-2 times daily as needed - You can use an extra dose of the antihistamine, if needed, for breakthrough symptoms.  - Consider nasal saline rinses 1-2 times daily to remove allergens from the nasal cavities as well as help with mucous clearance (this is especially helpful to do before the nasal sprays are given) - Consider allergy shots as a means of long-trm control. - Allergy shots "re-train" and "reset" the immune system to ignore environmental allergens and decrease the  resulting immune response to those allergens (sneezing,e itchy watery eyes, runny nose, nasal congestion, etc).    - Allergy shots improve symptoms in 75-85% of patients.  - We can discuss more at the next appointment if the medications are not working for you.  3. Anaphylactic shock due to food - Testing was positive only slightly to cashew, but I would like to get blood testing to confirm this. - If the blood testing is reassuring, we can discuss reintroducing this into the diet.  - EpiPen sent in last week.   4. Return in about 2 months (around 07/23/2019). This can be an in-person, a virtual Webex or a telephone follow up visit.   Subjective:   Sergio [name] is a 43 y.o. male presenting today for follow up of No chief complaint on file.   Sergio [name] has a history of the following: Patient Active Problem List   Diagnosis Date Noted  . Chronic migraine 10/19/2018  . Healthcare maintenance 05/13/2015  . Pain of left thumb 04/12/2015  . Obesity (BMI 30.0-34.9) 03/03/2013  . Gastroesophageal reflux disease with hiatal hernia 05/06/2012  . Essential hypertension 05/06/2012  . Irritable bowel syndrome with constipation and diarrhea 05/06/2012  . Obstructive sleep apnea 11/19/2008  . Hyperlipidemia 03/11/2008  . Asthma with allergic rhinitis 03/11/2008    History obtained from: chart review and patient.  Sergio [name] is a 43 y.o. male presenting for skin testing. He was last seen last week for a new patient appointment. Unfortunately, he had taken antihistamines so we could not do skin testing. We did start him on low dose Symbicort to help with this asthma. We continued the  Singulair and changed him to Xopenex due to tachycardia with albuterol.  We recommended continuing Singulair and Flonase but stopping his antihistamines for the testing today.  He also has a history of tree nut anaphylaxis which we are confirming today.  Since last visit, he has done well. He tells me that the  Symbicort has helped with his breathing. He has remained off of his antihistamines.   Otherwise, there have been no changes to his past medical history, surgical history, family history, or social history.    Review of Systems  Constitutional: Negative.  Negative for chills, fever, malaise/fatigue and weight loss.  HENT: Negative.  Negative for congestion, ear discharge and ear pain.   Eyes: Negative for pain, discharge and redness.  Respiratory: Negative for cough, sputum production, shortness of breath and wheezing.   Cardiovascular: Negative.  Negative for chest pain and palpitations.  Gastrointestinal: Negative for abdominal pain, heartburn, nausea and vomiting.  Skin: Negative.  Negative for itching and rash.  Neurological: Negative for dizziness and headaches.  Endo/Heme/Allergies: Negative for environmental allergies. Does not bruise/bleed easily.       Objective:   Physical Exam: deferred since this was a skin testing appointment only    Allergy Studies:    Airborne Adult Perc - 05/23/19 0812    Time Antigen Placed  0808    Allergen Manufacturer  Lavella Hammock    Location  Back    Number of Test  59    1. Control-Buffer 50% Glycerol  Negative    2. Control-Histamine 1 mg/ml  3+    3. Albumin saline  Negative    4. [place]  Negative    5. [place]  2+    6. [place]  2+    7. [place] Blue  Negative    8. Meadow Fescue  Negative    9. Perennial Rye  Negative    10. Sweet Vernal  Negative    11. Timothy  Negative    12. Cocklebur  Negative    13. Burweed [place]  2+    14. Ragweed, short  Negative    15. Ragweed, Giant  Negative    16. Plantain,  English  Negative    17. Lamb's Quarters  2+    18. Sheep Sorrell  Negative    19. Rough Pigweed  Negative    20. Marsh Elder, Rough  Negative    21. Mugwort, Common  2+    22. Ash mix  Negative    23. Birch mix  Negative    24. Beech American  Negative    25. Box, Elder  Negative    26. Cedar, red  2+    27.  [place]ot[place], Eastern  2+    28. Elm mix  2+    29. Hickory mix  Negative    30. Maple mix  Negative    31. Oak, [place] mix  Negative    32. Pecan Pollen  Negative    33. Pine mix  Negative    34. Sycamore Eastern  Negative    35. [place], Black Pollen  Negative    36. Alternaria alternata  Negative    37. Cladosporium Herbarum  Negative    38. Aspergillus mix  Negative    39. Penicillium mix  Negative    40. Bipolaris sorokiniana (Helminthosporium)  Negative    41. Drechslera spicifera (Curvularia)  Negative    42. Mucor plumbeus  Negative    43. Fusarium moniliforme  Negative  44. Aureobasidium pullulans (pullulara)  Negative    45. Rhizopus oryzae  Negative    46. Botrytis cinera  Negative    47. Epicoccum nigrum  Negative    48. Phoma betae  Negative    49. Candida Albicans  Negative    50. Trichophyton mentagrophytes  Negative    51. Mite, D Farinae  5,000 AU/ml  Negative    52. Mite, D Pteronyssinus  5,000 AU/ml  Negative    53. Cat Hair 10,000 BAU/ml  Negative    54.  Dog Epithelia  Negative    55. Mixed Feathers  Negative    56. Horse Epithelia  Negative    57. Cockroach, German  Negative    58. Mouse  Negative    59. Tobacco Leaf  Negative     Intradermal - 05/23/19 0855    Time Antigen Placed  0840    Allergen Manufacturer  Lavella Hammock    Location  Arm    Number of Test  11    Intradermal  Select    Control  Negative    7 Grass  Negative    Ragweed mix  2+    Mold 1  3+    Mold 2  2+    Mold 3  3+    Mold 4  3+    Cat  1+    Dog  Negative    Cockroach  Negative    Mite mix  Negative     Food Adult Perc - 05/23/19 0800    Time Antigen Placed  0808    Allergen Manufacturer  Lavella Hammock    Location  Back    Number of allergen test  7    10. Cashew  --   +/-   11. Pecan Food  Negative    12. [place]  Negative    13. Almond  Negative    14. Hazelnut  Negative    15. [place] nut  Negative    17. Pistachio  Negative       Allergy testing results were read  and interpreted by myself, documented by clinical staff.      Salvatore Marvel, MD  [facility]

## 2019-05-23 NOTE — Patient Instructions (Addendum)
1. Moderate persistent asthma, uncomplicated - I am glad that the Symbicort is working so well for you!  - Daily controller medication(s): Singulair 10mg  daily and Symbicort 80/4.27mcg two puffs twice daily with spacer - Prior to physical activity: Xopenex 2 puffs 10-15 minutes before physical activity. - Rescue medications: Xopenex 4 puffs every 4-6 hours as needed - Asthma control goals:  * Full participation in all desired activities (may need albuterol before activity) * Albuterol use two time or less a week on average (not counting use with activity) * Cough interfering with sleep two time or less a month * Oral steroids no more than once a year * No hospitalizations  2. Chronic rhinitis - Testing today showed: grasses, ragweed, weeds, trees, indoor molds, outdoor molds and cat - Copy of test results provided.  - Avoidance measures provided. - Stop taking: Benadryl - Continue with: Singulair (montelukast) 10mg  daily and Flonase (fluticasone) two sprays per nostril daily - Start taking: Allegra (fexofenadine) 180mg  table once daily and Astelin (azelastine) 2 sprays per nostril 1-2 times daily as needed - You can use an extra dose of the antihistamine, if needed, for breakthrough symptoms.  - Consider nasal saline rinses 1-2 times daily to remove allergens from the nasal cavities as well as help with mucous clearance (this is especially helpful to do before the nasal sprays are given) - Consider allergy shots as a means of long-trm control. - Allergy shots "re-train" and "reset" the immune system to ignore environmental allergens and decrease the resulting immune response to those allergens (sneezing,e itchy watery eyes, runny nose, nasal congestion, etc).    - Allergy shots improve symptoms in 75-85% of patients.  - We can discuss more at the next appointment if the medications are not working for you.  3. Anaphylactic shock due to food - Testing was positive only slightly to cashew,  but I would like to get blood testing to confirm this. - If the blood testing is reassuring, we can discuss reintroducing this into the diet.  - EpiPen sent in last week.   4. Return in about 2 months (around 07/23/2019). This can be an in-person, a virtual Webex or a telephone follow up visit.   Please inform us of any Emergency Department visits, hospitalizations, or changes in symptoms. Call us before going to the ED for breathing or allergy symptoms since we might be able to fit you in for a sick visit. Feel free to contact us anytime with any questions, problems, or concerns.  It was a pleasure to meet you today!  Websites that have reliable patient information: 1. American Academy of Asthma, Allergy, and Immunology: www.aaaai.org 2. Food Allergy Research and Education (FARE): foodallergy.org 3. Mothers of Asthmatics: http://www.asthmacommunitynetwork.org 4. American College of Allergy, Asthma, and Immunology: www.acaai.org  "Like" [place] on Facebook and Instagram for our latest updates!      Make sure you are registered to vote! If you have moved or changed any of your contact information, you will need to get this updated before voting!  In some cases, you MAY be able to register to vote online: [email or web address]    Voter ID laws are NOT going into effect for the General Election in November 2020! DO NOT let this stop you from exercising your right to vote!   Absentee voting is the SAFEST way to vote during the coronavirus pandemic!   Download and print an absentee ballot request form at [email or web address] or you can scan the QR code below with your  smart phone:      More information on absentee ballots can be found here: [email or web address]   Reducing Pollen Exposure  The American Academy of Allergy, Asthma and Immunology suggests the following steps to reduce your exposure to pollen during allergy seasons.    1. Do  not hang sheets or clothing out to dry; pollen may collect on these items. 2. Do not mow lawns or spend time around freshly cut grass; mowing stirs up pollen. 3. Keep windows closed at night.  Keep car windows closed while driving. 4. Minimize morning activities outdoors, a time when pollen counts are usually at their highest. 5. Stay indoors as much as possible when pollen counts or humidity is high and on windy days when pollen tends to remain in the air longer. 6. Use air conditioning when possible.  Many air conditioners have filters that trap the pollen spores. 7. Use a HEPA room air filter to remove pollen form the indoor air you breathe.  Control of Mold Allergen   Mold and fungi can grow on a variety of surfaces provided certain temperature and moisture conditions exist.  Outdoor molds grow on plants, decaying vegetation and soil.  The major outdoor mold, Alternaria and Cladosporium, are found in very high numbers during hot and dry conditions.  Generally, a late Summer - Fall peak is seen for common outdoor fungal spores.  Rain will temporarily lower outdoor mold spore count, but counts rise rapidly when the rainy period ends.  The most important indoor molds are Aspergillus and Penicillium.  Dark, humid and poorly ventilated basements are ideal sites for mold growth.  The next most common sites of mold growth are the bathroom and the kitchen.  Outdoor (Seasonal) Mold Control  Positive outdoor molds via skin testing: Alternaria, Cladosporium, Bipolaris (Helminthsporium), Drechslera (Curvalaria) and Mucor  1. Use air conditioning and keep windows closed 2. Avoid exposure to decaying vegetation. 3. Avoid leaf raking. 4. Avoid grain handling. 5. Consider wearing a face mask if working in moldy areas.  6.   Indoor (Perennial) Mold Control   Positive indoor molds via skin testing: Aspergillus, Penicillium, Fusarium, Aureobasidium (Pullulara) and Rhizopus  1. Maintain humidity below  50%. 2. Clean washable surfaces with 5% bleach solution. 3. Remove sources e.g. contaminated carpets.     Control of Dog or Cat Allergen  Avoidance is the best way to manage a dog or cat allergy. If you have a dog or cat and are allergic to dog or cats, consider removing the dog or cat from the home. If you have a dog or cat but don't want to find it a new home, or if your family wants a pet even though someone in the household is allergic, here are some strategies that may help keep symptoms at bay:  1. Keep the pet out of your bedroom and restrict it to only a few rooms. Be advised that keeping the dog or cat in only one room will not limit the allergens to that room. 2. Don't pet, hug or kiss the dog or cat; if you do, wash your hands with soap and water. 3. High-efficiency particulate air (HEPA) cleaners run continuously in a bedroom or living room can reduce allergen levels over time. 4. Regular use of a high-efficiency vacuum cleaner or a central vacuum can reduce allergen levels. 5. Giving your dog or cat a bath at least once a week can reduce airborne allergen.   Allergy Shots   Allergies are the result of a  chain reaction that starts in the immune system. Your immune system controls how your body defends itself. For instance, if you have an allergy to pollen, your immune system identifies pollen as an invader or allergen. Your immune system overreacts by producing antibodies called Immunoglobulin E (IgE). These antibodies travel to cells that release chemicals, causing an allergic reaction.  The concept behind allergy immunotherapy, whether it is received in the form of shots or tablets, is that the immune system can be desensitized to specific allergens that trigger allergy symptoms. Although it requires time and patience, the payback can be long-term relief.  How Do Allergy Shots Work?  Allergy shots work much like a vaccine. Your body responds to injected amounts of a  particular allergen given in increasing doses, eventually developing a resistance and tolerance to it. Allergy shots can lead to decreased, minimal or no allergy symptoms.  There generally are two phases: build-up and maintenance. Build-up often ranges from three to six months and involves receiving injections with increasing amounts of the allergens. The shots are typically given once or twice a week, though more rapid build-up schedules are sometimes used.  The maintenance phase begins when the most effective dose is reached. This dose is different for each person, depending on how allergic you are and your response to the build-up injections. Once the maintenance dose is reached, there are longer periods between injections, typically two to four weeks.  Occasionally doctors give cortisone-type shots that can temporarily reduce allergy symptoms. These types of shots are different and should not be confused with allergy immunotherapy shots.  Who Can Be Treated with Allergy Shots?  Allergy shots may be a good treatment approach for people with allergic rhinitis (hay fever), allergic asthma, conjunctivitis (eye allergy) or stinging insect allergy.   Before deciding to begin allergy shots, you should consider:  . The length of allergy season and the severity of your symptoms . Whether medications and/or changes to your environment can control your symptoms . Your desire to avoid long-term medication use . Time: allergy immunotherapy requires a major time commitment . Cost: may vary depending on your insurance coverage  Allergy shots for children age 75 and older are effective and often well tolerated. They might prevent the onset of new allergen sensitivities or the progression to asthma.  Allergy shots are not started on patients who are pregnant but can be continued on patients who become pregnant while receiving them. In some patients with other medical conditions or who take certain common  medications, allergy shots may be of risk. It is important to mention other medications you talk to your allergist.   When Will I Feel Better?  Some may experience decreased allergy symptoms during the build-up phase. For others, it may take as long as 12 months on the maintenance dose. If there is no improvement after a year of maintenance, your allergist will discuss other treatment options with you.  If you aren't responding to allergy shots, it may be because there is not enough dose of the allergen in your vaccine or there are missing allergens that were not identified during your allergy testing. Other reasons could be that there are high levels of the allergen in your environment or major exposure to non-allergic triggers like tobacco smoke.  What Is the Length of Treatment?  Once the maintenance dose is reached, allergy shots are generally continued for three to five years. The decision to stop should be discussed with your allergist at that time. Some  people may experience a permanent reduction of allergy symptoms. Others may relapse and a longer course of allergy shots can be considered.  What Are the Possible Reactions?  The two types of adverse reactions that can occur with allergy shots are local and systemic. Common local reactions include very mild redness and swelling at the injection site, which can happen immediately or several hours after. A systemic reaction, which is less common, affects the entire body or a particular body system. They are usually mild and typically respond quickly to medications. Signs include increased allergy symptoms such as sneezing, a stuffy nose or hives.  Rarely, a serious systemic reaction called anaphylaxis can develop. Symptoms include swelling in the throat, wheezing, a feeling of tightness in the chest, nausea or dizziness. Most serious systemic reactions develop within 30 minutes of allergy shots. This is why it is strongly recommended you wait in  your doctor's office for 30 minutes after your injections. Your allergist is trained to watch for reactions, and his or her staff is trained and equipped with the proper medications to identify and treat them.  Who Should Administer Allergy Shots?  The preferred location for receiving shots is your prescribing allergist's office. Injections can sometimes be given at another facility where the physician and staff are trained to recognize and treat reactions, and have received instructions by your prescribing allergist.

## 2019-05-23 NOTE — Telephone Encounter (Signed)
Gave patient information about making his appointment for new start allergy shots. Pt states that he will call back and make an appointment.

## 2019-05-23 NOTE — Telephone Encounter (Signed)
Entered in error

## 2019-05-25 LAB — ALLERGY PANEL 18, NUT MIX GROUP
Allergen Coconut IgE: <0.1 kU/L
F020-IgE Almond: <0.1 kU/L
F202-IgE Cashew Nut: <0.1 kU/L
Hazelnut (Filbert) IgE: <0.1 kU/L
Peanut IgE: <0.1 kU/L
Pecan Nut IgE: <0.1 kU/L
Sesame Seed IgE: <0.1 kU/L

## 2019-05-25 LAB — ALLERGEN, BRAZIL NUT, F18: Brazil Nut IgE: <0.1 kU/L

## 2019-05-25 LAB — ALLERGEN WALNUT F256: Walnut IgE: <0.1 kU/L

## 2019-05-29 ENCOUNTER — Other Ambulatory Visit (INDEPENDENT_AMBULATORY_CARE_PROVIDER_SITE_OTHER): Payer: Self-pay | Admitting: Internal Medicine

## 2019-06-13 MED FILL — LOSARTAN-HCTZ 100-25 MG TAB: 100-25 | 30 days supply | Qty: 30 | Fill #2

## 2019-06-13 MED FILL — SUMATRIPTAN SUCC 50 MG TAB: 50 | 30 days supply | Qty: 10 | Fill #2

## 2019-06-19 ENCOUNTER — Other Ambulatory Visit (INDEPENDENT_AMBULATORY_CARE_PROVIDER_SITE_OTHER): Payer: Self-pay | Admitting: Internal Medicine

## 2019-06-19 DIAGNOSIS — J4520 Mild intermittent asthma, uncomplicated: Secondary | ICD-10-CM

## 2019-06-19 MED FILL — MONTELUKAST SOD 10 MG TAB: 10 | 90 days supply | Qty: 90 | Fill #0

## 2019-07-03 MED FILL — PANTOPRAZOLE SOD DR 40 MG T: 40 | 30 days supply | Qty: 30 | Fill #1

## 2019-07-12 ENCOUNTER — Ambulatory Visit (INDEPENDENT_AMBULATORY_CARE_PROVIDER_SITE_OTHER): Payer: 59 | Admitting: Internal Medicine

## 2019-07-20 ENCOUNTER — Encounter: Payer: Self-pay | Admitting: Allergy & Immunology

## 2019-07-24 ENCOUNTER — Ambulatory Visit (INDEPENDENT_AMBULATORY_CARE_PROVIDER_SITE_OTHER): Payer: 59 | Admitting: Internal Medicine

## 2019-07-26 ENCOUNTER — Other Ambulatory Visit: Payer: Self-pay

## 2019-07-26 ENCOUNTER — Encounter: Payer: Self-pay | Admitting: Allergy & Immunology

## 2019-07-26 ENCOUNTER — Ambulatory Visit (INDEPENDENT_AMBULATORY_CARE_PROVIDER_SITE_OTHER): Payer: 59 | Admitting: Allergy & Immunology

## 2019-07-26 VITALS — BP 138/90 | HR 88 | Temp 98.1°F | Resp 18

## 2019-07-26 DIAGNOSIS — J302 Other seasonal allergic rhinitis: Secondary | ICD-10-CM | POA: Diagnosis not present

## 2019-07-26 DIAGNOSIS — J4540 Moderate persistent asthma, uncomplicated: Secondary | ICD-10-CM

## 2019-07-26 DIAGNOSIS — J3089 Other allergic rhinitis: Secondary | ICD-10-CM | POA: Diagnosis not present

## 2019-07-26 DIAGNOSIS — J0100 Acute maxillary sinusitis, unspecified: Secondary | ICD-10-CM | POA: Diagnosis not present

## 2019-07-26 DIAGNOSIS — T7800XD Anaphylactic reaction due to unspecified food, subsequent encounter: Secondary | ICD-10-CM | POA: Diagnosis not present

## 2019-07-26 MED ORDER — DOXYCYCLINE HYCLATE 100 MG PO TABS: 100.0000 mg | ORAL_TABLET | Freq: Two times a day (BID) | ORAL | 0 refills | Status: AC

## 2019-07-26 MED FILL — DOXYCYCLINE HYCLATE 100 MG: 100 | 10 days supply | Qty: 20 | Fill #0

## 2019-07-26 NOTE — Progress Notes (Signed)
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 Moderate persistent asthma, uncomplicated - Lung testing looked really good today. - We are not going to make any medication changes at this time.  - Daily controller medication(s): Singulair 10mg  daily and Symbicort 80/4.44mcg two puffs twice daily with spacer - Prior to physical activity: Xopenex 2 puffs 10-15 minutes before physical activity. - Rescue medications: Xopenex 4 puffs every 4-6 hours as needed - Asthma control goals:  * Full participation in all desired activities (may need albuterol before activity) * Albuterol use two time or less a week on average (not counting use with activity) * Cough interfering with sleep two time or less a month * Oral steroids no more than once a year * No hospitalizations  2. Chronic rhinitis (grasses, ragweed, weeds, trees, indoor molds, outdoor molds and cat) - Let us know about the allergy shots. - Continue with: Singulair (montelukast) 10mg  daily and Flonase (fluticasone) two sprays per nostril daily Allegra (fexofenadine) 180mg  table once daily and Astelin (azelastine) 2 sprays per nostril 1-2 times daily as needed - CPT codes provided.    3. Sinusitis  - With your current symptoms and time course, antibiotics are needed: doxycycline 100mg  twice daily for 10 days - Add on nasal saline spray (i.e., Simply Saline) or nasal saline lavage (i.e., NeilMed) as needed prior to medicated nasal sprays. - For thick post nasal drainage, add guaifenesin [phone] mg (Mucinex) twice daily as needed for mucous thinning with adequate hydration to help it  work.  - Give me an update on Friday.   4. Anaphylactic shock due to food - Introduce some tree nuts at home.  - With the negative blood and skin testing, this really makes it likely that you will tolerate them.    5. Return in about 6 months (around 01/23/2020). This can be an in-person, a virtual Webex or a telephone follow up visit.   Subjective:   Sergio [name] is a 43 y.o. male presenting today for follow up of  Chief Complaint  Patient presents with  . Asthma    Chronic nonproductive cough x 2 weeks   . Allergic Rhinitis     Sergio [name] has a history of the following: Patient Active Problem List   Diagnosis Date Noted  . Anaphylactic shock due to adverse food reaction 05/23/2019  . Moderate persistent asthma, uncomplicated [id number]  . Seasonal and perennial allergic rhinitis 05/23/2019  . Chronic migraine 10/19/2018  . Healthcare maintenance 05/13/2015  . Pain of left thumb 04/12/2015  . Obesity (BMI 30.0-34.9) 03/03/2013  . Gastroesophageal reflux disease with hiatal hernia 05/06/2012  . Essential hypertension 05/06/2012  . Irritable bowel syndrome with constipation and diarrhea 05/06/2012  . Obstructive sleep apnea 11/19/2008  . Hyperlipidemia 03/11/2008  . Asthma with allergic rhinitis 03/11/2008    History obtained from: chart review and patient.  Sergio [name] is a 43 y.o. male presenting for a follow up visit.  He was last seen in September 2020.  At that time, his Symbicort seem to be working very well.  We continued 80/4.5 mcg 2 puffs twice daily with a spacer as well as Singulair 10 mg daily.  He had  testing that was positive to grasses, ragweed, weeds, trees, indoor and outdoor molds, and cat.  We continued Singulair and Flonase and added on Allegra and Astelin.  He had testing that was negative to all the tree nuts (slight reactivity to cashew) and we obtain blood work to confirm this.  Since last visit, he has mostly done well. He did go to the beach two  weeks ago. He has a dry cough but no other symptoms including fever, chest pain, or other concerns. He does report some sinus pressure as well. He is having a lot of drainage even with the nose sprays. The nose sprays do help but the drainage continues to be a problem. Cough is mostly during the day and he tells me that he is drinking a lot of water to make sure that his mucous stays thin.  He does report some sinus pressure bilaterally. He does tend to get antibiotics around this time of the year every year.   Asthma/Respiratory Symptom History: He remains on the Symbicort 2 puffs twice daily.  He does feel overall that this is helping.  He has tried using his albuterol with this current cough and it does not provide much relief.  Allergic Rhinitis Symptom History: He remains on the nose spray as well as the antihistamine.  He is interested in allergen immunotherapy but needs the CPT codes again to check with insurance.  He will be getting the allergy shots in [place], since that is where he works.  Food Allergy Symptom History: He is interested in introducing tree nuts at home.  In fact, he did introduce pistachios and did fine with these. He will introduce more tree nuts over time.   Otherwise, there have been no changes to his past medical history, surgical history, family history, or social history.    Review of Systems  Constitutional: Negative.  Negative for chills, fever, malaise/fatigue and weight loss.  HENT: Positive for congestion and sinus pain. Negative for ear discharge, ear pain and sore throat.   Eyes: Negative for pain, discharge and redness.  Respiratory: Positive for cough. Negative for sputum production, shortness of breath and wheezing.   Cardiovascular: Negative.  Negative for chest pain and palpitations.  Gastrointestinal: Negative for abdominal pain, heartburn, nausea and vomiting.  Skin: Negative.  Negative for itching and rash.  Neurological: Negative for dizziness  and headaches.  Endo/Heme/Allergies: Negative for environmental allergies. Does not bruise/bleed easily.       Objective:   Blood pressure 138/90, pulse 88, temperature 98.1 F (36.7 C), temperature source Temporal, resp. rate 18, SpO2 96 %. There is no height or weight on file to calculate BMI.   Physical Exam:  Physical Exam  Constitutional: He appears well-developed.  Very friendly male.  Well-appearing.  HENT:  Head: Normocephalic and atraumatic.  Right Ear: Tympanic membrane, external ear and ear canal normal.  Left Ear: External ear and ear canal normal. Tympanic membrane is injected and erythematous.  Nose: Sinus tenderness present. No mucosal edema, rhinorrhea, nasal deformity or septal deviation. No epistaxis. Right sinus exhibits frontal sinus tenderness. Right sinus exhibits no maxillary sinus tenderness. Left sinus exhibits frontal sinus tenderness. Left sinus exhibits no maxillary sinus tenderness.  Mouth/Throat: Uvula is midline and oropharynx is clear and moist. Mucous membranes are not pale and not dry.  Eyes: Pupils are equal, round, and reactive to light. Conjunctivae and EOM are normal. Right eye exhibits no chemosis and no discharge. Left eye exhibits no chemosis and no discharge. Right  conjunctiva is not injected. Left conjunctiva is not injected.  Allergic shiners bilaterally.  Cardiovascular: Normal rate, regular rhythm and normal heart sounds.  Respiratory: Effort normal and breath sounds normal. No accessory muscle usage. No tachypnea. No respiratory distress. He has no wheezes. He has no rhonchi. He has no rales. He exhibits no tenderness.  Moving air well in all lung fields.  Lymphadenopathy:    He has no cervical adenopathy.  Neurological: He is alert.  Skin: No abrasion, no petechiae and no rash noted. Rash is not papular, not vesicular and not urticarial. No erythema. No pallor.  No eczematous or urticarial lesions noted.  Psychiatric: He has a normal  mood and affect.     Diagnostic studies:    Spirometry: results normal (FEV1: 3.19/85%, FVC: 4.23/85%, FEV1/FVC: 75%).    Spirometry consistent with normal pattern.   Allergy Studies: none       Salvatore Marvel, MD  [facility]

## 2019-07-26 NOTE — Patient Instructions (Addendum)
1. Moderate persistent asthma, uncomplicated - Lung testing looked really good today. - We are not going to make any medication changes at this time.  - Daily controller medication(s): Singulair 10mg  daily and Symbicort 80/4.10mcg two puffs twice daily with spacer - Prior to physical activity: Xopenex 2 puffs 10-15 minutes before physical activity. - Rescue medications: Xopenex 4 puffs every 4-6 hours as needed - Asthma control goals:  * Full participation in all desired activities (may need albuterol before activity) * Albuterol use two time or less a week on average (not counting use with activity) * Cough interfering with sleep two time or less a month * Oral steroids no more than once a year * No hospitalizations  2. Chronic rhinitis (grasses, ragweed, weeds, trees, indoor molds, outdoor molds and cat) - Let us know about the allergy shots. - Continue with: Singulair (montelukast) 10mg  daily and Flonase (fluticasone) two sprays per nostril daily Allegra (fexofenadine) 180mg  table once daily and Astelin (azelastine) 2 sprays per nostril 1-2 times daily as needed - CPT codes provided.    3. Sinusitis  - With your current symptoms and time course, antibiotics are needed: doxycycline 100mg  twice daily for 10 days - Add on nasal saline spray (i.e., Simply Saline) or nasal saline lavage (i.e., NeilMed) as needed prior to medicated nasal sprays. - For thick post nasal drainage, add guaifenesin [phone] mg (Mucinex) twice daily as needed for mucous thinning with adequate hydration to help it work.  - Give me an update on Friday.   4. Anaphylactic shock due to food - Introduce some tree nuts at home.  - With the negative blood and skin testing, this really makes it likely that you will tolerate them.    5. Return in about 6 months (around 01/23/2020). This can be an in-person, a virtual Webex or a telephone follow up visit.   Please inform us of any Emergency Department visits,  hospitalizations, or changes in symptoms. Call us before going to the ED for breathing or allergy symptoms since we might be able to fit you in for a sick visit. Feel free to contact us anytime with any questions, problems, or concerns.  It was a pleasure to see you again today!  Websites that have reliable patient information: 1. American Academy of Asthma, Allergy, and Immunology: www.aaaai.org 2. Food Allergy Research and Education (FARE): foodallergy.org 3. Mothers of Asthmatics: http://www.asthmacommunitynetwork.org 4. American College of Allergy, Asthma, and Immunology: www.acaai.org  "Like" [place] on Facebook and Instagram for our latest updates!      Make sure you are registered to vote! If you have moved or changed any of your contact information, you will need to get this updated before voting!  In some cases, you MAY be able to register to vote online: [email or web address]

## 2019-07-31 MED FILL — LOSARTAN-HCTZ 100-25 MG TAB: 100-25 | 30 days supply | Qty: 30 | Fill #3

## 2019-09-04 MED FILL — PANTOPRAZOLE SOD DR 40 MG T: 40 | 30 days supply | Qty: 30 | Fill #2

## 2019-09-13 ENCOUNTER — Encounter: Payer: Self-pay | Admitting: Interventional Cardiology

## 2019-09-13 ENCOUNTER — Other Ambulatory Visit: Payer: Self-pay

## 2019-09-13 ENCOUNTER — Ambulatory Visit: Payer: 59 | Admitting: Interventional Cardiology

## 2019-09-13 VITALS — BP 122/88 | HR 97 | Ht 69.5 in | Wt 230.6 lb

## 2019-09-13 DIAGNOSIS — R002 Palpitations: Secondary | ICD-10-CM

## 2019-09-13 DIAGNOSIS — R55 Syncope and collapse: Secondary | ICD-10-CM | POA: Diagnosis not present

## 2019-09-13 DIAGNOSIS — R0600 Dyspnea, unspecified: Secondary | ICD-10-CM

## 2019-09-13 DIAGNOSIS — I10 Essential (primary) hypertension: Secondary | ICD-10-CM

## 2019-09-13 DIAGNOSIS — R0609 Other forms of dyspnea: Secondary | ICD-10-CM

## 2019-09-13 NOTE — Patient Instructions (Signed)
Medication Instructions:  Your physician recommends that you continue on your current medications as directed. Please refer to the Current Medication list given to you today.  *If you need a refill on your cardiac medications before your next appointment, please call your pharmacy*  Lab Work: None ordered  If you have labs (blood work) drawn today and your tests are completely normal, you will receive your results only by: Marland Kitchen MyChart Message (if you have MyChart) OR . A paper copy in the mail If you have any lab test that is abnormal or we need to change your treatment, we will call you to review the results.  Testing/Procedures: Your physician has requested that you have an echocardiogram. Echocardiography is a painless test that uses sound waves to create images of your heart. It provides your doctor with information about the size and shape of your heart and how well your heart's chambers and valves are working. This procedure takes approximately one hour. There are no restrictions for this procedure.  COVID test needed before exercise tolerance test  Your physician has requested that you have an exercise tolerance test. For further information please visit [email or web address]. Please also follow instruction sheet, as given.    Follow-Up: Based on test results  Other Instructions  Echocardiogram An echocardiogram is a procedure that uses painless sound waves (ultrasound) to produce an image of the heart. Images from an echocardiogram can provide important information about:  Signs of coronary artery disease (CAD).  Aneurysm detection. An aneurysm is a weak or damaged part of an artery wall that bulges out from the normal force of blood pumping through the body.  Heart size and shape. Changes in the size or shape of the heart can be associated with certain conditions, including heart failure, aneurysm, and CAD.  Heart muscle function.  Heart valve function.  Signs of a past  heart attack.  Fluid buildup around the heart.  Thickening of the heart muscle.  A tumor or infectious growth around the heart valves. Tell a health care provider about:  Any allergies you have.  All medicines you are taking, including vitamins, herbs, eye drops, creams, and over-the-counter medicines.  Any blood disorders you have.  Any surgeries you have had.  Any medical conditions you have.  Whether you are pregnant or may be pregnant. What are the risks? Generally, this is a safe procedure. However, problems may occur, including:  Allergic reaction to dye (contrast) that may be used during the procedure. What happens before the procedure? No specific preparation is needed. You may eat and drink normally. What happens during the procedure?   An IV tube may be inserted into one of your veins.  You may receive contrast through this tube. A contrast is an injection that improves the quality of the pictures from your heart.  A gel will be applied to your chest.  A wand-like tool (transducer) will be moved over your chest. The gel will help to transmit the sound waves from the transducer.  The sound waves will harmlessly bounce off of your heart to allow the heart images to be captured in real-time motion. The images will be recorded on a computer. The procedure may vary among health care providers and hospitals. What happens after the procedure?  You may return to your normal, everyday life, including diet, activities, and medicines, unless your health care provider tells you not to do that. Summary  An echocardiogram is a procedure that uses painless sound waves (ultrasound) to produce  an image of the heart.  Images from an echocardiogram can provide important information about the size and shape of your heart, heart muscle function, heart valve function, and fluid buildup around your heart.  You do not need to do anything to prepare before this procedure. You may eat  and drink normally.  After the echocardiogram is completed, you may return to your normal, everyday life, unless your health care provider tells you not to do that. This information is not intended to replace advice given to you by your health care provider. Make sure you discuss any questions you have with your health care provider. Document Released: 08/28/2000 Document Revised: 12/22/2018 Document Reviewed: 10/03/2016 Elsevier Patient Education  [employer].  Exercise Stress Test An exercise stress test is a test to check how your heart works during exercise. You will need to walk on a treadmill or ride an exercise bike for this test. An electrocardiogram (ECG) will record your heartbeat when you are at rest and when you are exercising. You may have an ultrasound or nuclear test after the exercise test. The test is done to check for coronary artery disease (CAD). It is also done to:  See how well you can exercise.  Watch for high blood pressure during exercise.  Test how well you can exercise after treatment.  Check the blood flow to your arms and legs. If your test result is not normal, more testing may be needed. What happens before the procedure?  Follow instructions from your doctor about what you cannot eat or drink. ? Do not have any drinks or foods that have caffeine in them for 24 hours before the test, or as told by your doctor. This includes coffee, tea (even decaf tea), sodas, chocolate, and cocoa.  Ask your doctor about changing or stopping your normal medicines. This is important if you: ? Take diabetes medicines. ? Take beta-blocker medicines. ? Wear a nitroglycerin patch.  If you use an inhaler, bring it with you to the test.  Do not put lotions, powders, creams, or oils on your chest before the test.  Wear comfortable shoes and clothing.  Do not use any products that have nicotine or tobacco in them, such as cigarettes and e-cigarettes. Stop using them at  least 4 hours before the test. If you need help quitting, ask your doctor. What happens during the procedure?   Patches (electrodes) will be put on your chest.  Wires will be connected to the patches. The wires will send signals to a machine to record your heartbeat.  Your heart rate will be watched while you are resting and while you are exercising. Your blood pressure will also be watched during the test.  You will walk on a treadmill or use a stationary bike. If you cannot use these, you may be asked to turn a crank with your hands.  The activity will get harder and will raise your heart rate.  You may be asked to breathe into a tube a few times during the test. This measures the gases that you breathe out.  You will be asked how you are feeling throughout the test.  You will exercise until your heart reaches a target heart rate. You will stop early if: ? You feel dizzy. ? You have chest pain. ? You are out of breath. ? Your blood pressure is too high or too low. ? You have an irregular heartbeat. ? You have pain or aching in your arms or legs.  The procedure may vary among doctors and hospitals. What happens after the procedure?  Your blood pressure, heart rate, breathing rate, and blood oxygen level will be watched after the test.  You may return to your normal diet and activities as told by your doctor.  It is up to you to get the results of your test. Ask your doctor, or the department that is doing the test, when your results will be ready. Summary  An exercise stress test is a test to check how your heart works during exercise.  This test is done to check for coronary artery disease.  Your heart rate will be watched while you are resting and while you are exercising.  Follow instructions from your doctor about what you cannot eat or drink before the test. This information is not intended to replace advice given to you by your health care provider. Make sure you  discuss any questions you have with your health care provider. Document Released: 02/17/2008 Document Revised: 12/13/2018 Document Reviewed: 12/01/2016 Elsevier Patient Education  2020 [employer].

## 2019-09-13 NOTE — Progress Notes (Signed)
Cardiology Office Note   Date:  09/13/2019   ID:  Sergio [name] [birth year], MRN [id number]  PCP:  Doree Albee, MD    No chief complaint on file.  Palpitations/presyncope with exercise  Wt Readings from Last 3 Encounters:  09/13/19 230 lb 9.6 oz (104.6 kg)  05/19/19 219 lb (99.3 kg)  10/19/18 228 lb (103.4 kg)       History of Present Illness: Sergio [name] is a 43 y.o. male who is being seen today for the evaluation of  at the request of Sergio Ards, NP.  He noticed that he would have tachycardia and presyncope when he started working with a Clinical research associate.  As exercises got more intense in 10/2018, sx started.  Exercise with trainer stopped at that time because of COVID.    He walks twice a week, but nothing as intense as what he did with the trainer.  With the trainer, he would do weights and cardio together.  He would use the elliptical and treadmill, HIIT.  With the high intensity portion, he would get a little lightheaded.  At that time, he was still having high BP.    More recently, BP has been more controlled.  Usually, diastolic BP is the problem.  He feels some fatigue as well.  He no longer uses CPAP; not for the last 6 months.  He needs a repeat study.   He has some SHOB, worse with exertion.  Unclear if it is asthma.     Past Medical History:  Diagnosis Date  . Asthma with allergic rhinitis 03/11/2008  . Essential hypertension 05/06/2012  . Gastroesophageal reflux disease with hiatal hernia 05/06/2012  . Gastroesophageal reflux disease with hiatal hernia   . Hyperlipidemia LDL goal < 160 03/11/2008  . Irritable bowel syndrome with constipation and diarrhea 05/06/2012  . Migraine   . Obesity (BMI 30.0-34.9) 03/03/2013  . Obstructive sleep apnea 11/19/2008   Nocturnal polysomnography 12/14/2008: AHI 20.8, O2 Sat nadir 84%, optimal CPAP 12 cm H2O   . Urticaria     Past Surgical History:  Procedure Laterality Date  . URETHRAL DILATION       Current  Outpatient Medications  Medication Sig Dispense Refill  . azelastine (ASTELIN) 0.1 % nasal spray Place 2 sprays into both nostrils 2 (two) times daily. 30 mL 5  . budesonide-formoterol (SYMBICORT) 80-4.5 MCG/ACT inhaler Inhale 2 puffs into the lungs 2 (two) times daily. 1 Inhaler 5  . cetirizine (ZYRTEC) 10 MG tablet Take 1 tablet (10 mg total) by mouth daily. 30 tablet 6  . cholecalciferol (VITAMIN D3) 25 MCG (1000 UT) tablet Take 5,000 Units by mouth daily.    . diphenhydrAMINE (SOMINEX) 25 MG tablet 2 at bedtime for allergies and for sleep as needed     . EPINEPHrine 0.3 mg/0.3 mL IJ SOAJ injection Inject 0.3 mLs (0.3 mg total) into the muscle as needed for anaphylaxis. 2 each 1  . levalbuterol (XOPENEX HFA) 45 MCG/ACT inhaler 4 puffs every 4-6 hours as needed. 15 g 1  . losartan-hydrochlorothiazide (HYZAAR) 100-25 MG tablet Take 1 tablet by mouth daily.    . montelukast (SINGULAIR) 10 MG tablet TAKE 1 TABLET BY MOUTH ONCE A DAY 90 tablet 0  . NP THYROID 60 MG tablet Take 1 tablet by mouth once daily 30 tablet 3  . pantoprazole (PROTONIX) 40 MG tablet Take 1 tablet (40 mg total) by mouth daily. 90 tablet 3  . simvastatin (ZOCOR) 20 MG tablet  Take 1 tablet (20 mg total) by mouth daily at 6 PM. 90 tablet 3  . SUMAtriptan (IMITREX) 50 MG tablet Take 1 tablet (50 mg total) by mouth every 2 (two) hours as needed for migraine. May repeat in 2 hours if headache persists or recurs. 10 tablet 5  . testosterone cypionate (DEPOTESTOSTERONE CYPIONATE) 200 MG/ML injection      No current facility-administered medications for this visit.    Allergies:   Other    Social History:  The patient  reports that he has never smoked. He has never used smokeless tobacco. He reports current alcohol use. He reports that he does not use drugs.   Family History:  The patient's family history includes ALS in his paternal grandfather; Breast cancer in his paternal grandmother; COPD in his maternal grandfather;  Diabetes in his paternal aunt and paternal grandfather; Early death in his father; Healthy in his mother, sister, and sister; Hypertension in his maternal grandmother and maternal uncle; Kidney cancer in his paternal grandmother; Parkinson's disease in his maternal grandmother; Vaginal cancer in his maternal grandmother.    ROS:  Please see the history of present illness.   Otherwise, review of systems are positive for fatigue.   All other systems are reviewed and negative.    PHYSICAL EXAM: VS:  BP 122/88   Pulse 97   Ht 5' 9.5" (1.765 m)   Wt 230 lb 9.6 oz (104.6 kg)   SpO2 97%   BMI 33.57 kg/m  , BMI Body mass index is 33.57 kg/m. GEN: Well nourished, well developed, in no acute distress  HEENT: normal  Neck: no JVD, carotid bruits, or masses Cardiac: RRR; no murmurs, rubs, or gallops,no edema  Respiratory:  clear to auscultation bilaterally, normal work of breathing GI: soft, nontender, nondistended, + BS MS: no deformity or atrophy  Skin: warm and dry, no rash Neuro:  Strength and sensation are intact Psych: euthymic mood, full affect   EKG:   The ekg ordered today demonstrates NSR, no ST changes   Recent Labs: No results found for requested labs within last 8760 hours.   Lipid Panel    Component Value Date/Time   CHOL 159 03/06/2013 0844   TRIG 144 03/06/2013 0844   HDL 35 (L) 03/06/2013 0844   CHOLHDL 4.5 03/06/2013 0844   VLDL 29 03/06/2013 0844   LDLCALC 95 03/06/2013 0844     Other studies Reviewed: Additional studies/ records that were reviewed today with results demonstrating: .   ASSESSMENT AND PLAN:  1. Presyncope/palpitations: Worse with high intensity.Plan for ETT and echo. 2. Due for physical in Jan.  Would like for him to get a CBC, CMet, lipids, TSH.  He is on something for his thyroid already.  He has hypothyroidism.   3. DOE: Check echo.   4. HTN: The current medical regimen is effective;  continue present plan and medications. 5. If echo  and ETT are ok, he should [name] able to gradually work his way up to more intense cardiovascular exercises.     Current medicines are reviewed at length with the patient today.  The patient concerns regarding his medicines were addressed.  The following changes have been made:  No change  Labs/ tests ordered today include:  No orders of the defined types were placed in this encounter.   Recommend 150 minutes/week of aerobic exercise Low fat, low carb, high fiber diet recommended  Disposition:   FU based on test results   Signed, Larae Grooms, MD  09/13/2019 2:23 PM    [address], [place], [place]  [id number] Phone: [phone]; Fax: [phone]

## 2019-09-18 ENCOUNTER — Other Ambulatory Visit (HOSPITAL_COMMUNITY): Payer: 59

## 2019-09-18 MED FILL — LOSARTAN-HCTZ 100-25 MG TAB: 100-25 | 30 days supply | Qty: 30 | Fill #4

## 2019-09-22 ENCOUNTER — Other Ambulatory Visit (HOSPITAL_COMMUNITY): Payer: 59

## 2019-09-25 MED FILL — FLUTICASONE PROP 50 MCG SPR: 50 | 30 days supply | Qty: 16 | Fill #4

## 2019-09-25 MED FILL — AZELASTINE HCL 137 MCG SPRY: 0.1 | 25 days supply | Qty: 30 | Fill #1

## 2019-10-06 ENCOUNTER — Other Ambulatory Visit (HOSPITAL_COMMUNITY)
Admission: RE | Admit: 2019-10-06 | Discharge: 2019-10-06 | Disposition: A | Discharge status: Home / Self Care | Payer: 59 | Source: Ambulatory Visit | Attending: Interventional Cardiology | Admitting: Interventional Cardiology

## 2019-10-06 DIAGNOSIS — Z20822 Contact with and (suspected) exposure to covid-19: Secondary | ICD-10-CM | POA: Insufficient documentation

## 2019-10-06 DIAGNOSIS — Z01812 Encounter for preprocedural laboratory examination: Secondary | ICD-10-CM | POA: Diagnosis not present

## 2019-10-06 LAB — SARS CORONAVIRUS 2 (TAT 6-24 HRS): SARS Coronavirus 2: NEGATIVE

## 2019-10-10 ENCOUNTER — Other Ambulatory Visit: Payer: Self-pay

## 2019-10-10 ENCOUNTER — Ambulatory Visit (INDEPENDENT_AMBULATORY_CARE_PROVIDER_SITE_OTHER): Payer: 59

## 2019-10-10 ENCOUNTER — Ambulatory Visit (HOSPITAL_COMMUNITY): Payer: 59 | Attending: Cardiovascular Disease

## 2019-10-10 DIAGNOSIS — R0609 Other forms of dyspnea: Secondary | ICD-10-CM

## 2019-10-10 DIAGNOSIS — R55 Syncope and collapse: Secondary | ICD-10-CM | POA: Diagnosis not present

## 2019-10-10 DIAGNOSIS — R0600 Dyspnea, unspecified: Secondary | ICD-10-CM | POA: Diagnosis not present

## 2019-10-10 LAB — EXERCISE TOLERANCE TEST
Estimated workload: 10.1 METS
Exercise duration (min): 8 min
Exercise duration (sec): 0 s
MPHR: 175 {beats}/min
Peak HR: 173 {beats}/min
Percent HR: 98 %
RPE: 16
Rest HR: 94 {beats}/min

## 2019-10-11 ENCOUNTER — Other Ambulatory Visit (INDEPENDENT_AMBULATORY_CARE_PROVIDER_SITE_OTHER): Payer: Self-pay | Admitting: Internal Medicine

## 2019-10-11 ENCOUNTER — Other Ambulatory Visit (INDEPENDENT_AMBULATORY_CARE_PROVIDER_SITE_OTHER): Payer: Self-pay

## 2019-10-11 DIAGNOSIS — K449 Diaphragmatic hernia without obstruction or gangrene: Secondary | ICD-10-CM

## 2019-10-11 DIAGNOSIS — K219 Gastro-esophageal reflux disease without esophagitis: Secondary | ICD-10-CM

## 2019-10-11 DIAGNOSIS — J4520 Mild intermittent asthma, uncomplicated: Secondary | ICD-10-CM

## 2019-10-11 MED ORDER — PANTOPRAZOLE SODIUM 40 MG PO TBEC: 40.0000 mg | DELAYED_RELEASE_TABLET | Freq: Every day | ORAL | 2 refills | Status: DC

## 2019-10-11 MED FILL — SYMBICORT 80-4.5 MCG INH: 80-4.5 | 30 days supply | Qty: 10 | Fill #1

## 2019-10-11 MED FILL — MONTELUKAST SOD 10 MG TAB: 10 | 90 days supply | Qty: 90 | Fill #0

## 2019-10-11 MED FILL — PANTOPRAZOLE SOD DR 40 MG T: 40 | 30 days supply | Qty: 30 | Fill #0

## 2019-10-29 MED FILL — LOSARTAN-HCTZ 100-25 MG TAB: 100-25 | 30 days supply | Qty: 30 | Fill #5

## 2019-11-09 ENCOUNTER — Other Ambulatory Visit (INDEPENDENT_AMBULATORY_CARE_PROVIDER_SITE_OTHER): Payer: Self-pay | Admitting: Internal Medicine

## 2019-11-13 ENCOUNTER — Ambulatory Visit (INDEPENDENT_AMBULATORY_CARE_PROVIDER_SITE_OTHER): Payer: 59 | Admitting: Internal Medicine

## 2019-11-13 ENCOUNTER — Encounter (INDEPENDENT_AMBULATORY_CARE_PROVIDER_SITE_OTHER): Payer: Self-pay | Admitting: Internal Medicine

## 2019-11-13 ENCOUNTER — Other Ambulatory Visit: Payer: Self-pay

## 2019-11-13 VITALS — BP 130/85 | HR 96 | Temp 97.6°F | Ht 70.0 in | Wt 230.4 lb

## 2019-11-13 DIAGNOSIS — E669 Obesity, unspecified: Secondary | ICD-10-CM

## 2019-11-13 DIAGNOSIS — Z0001 Encounter for general adult medical examination with abnormal findings: Secondary | ICD-10-CM

## 2019-11-13 DIAGNOSIS — Z131 Encounter for screening for diabetes mellitus: Secondary | ICD-10-CM

## 2019-11-13 DIAGNOSIS — J4540 Moderate persistent asthma, uncomplicated: Secondary | ICD-10-CM

## 2019-11-13 DIAGNOSIS — I10 Essential (primary) hypertension: Secondary | ICD-10-CM | POA: Diagnosis not present

## 2019-11-13 DIAGNOSIS — E782 Mixed hyperlipidemia: Secondary | ICD-10-CM | POA: Diagnosis not present

## 2019-11-13 DIAGNOSIS — R10811 Right upper quadrant abdominal tenderness: Secondary | ICD-10-CM | POA: Diagnosis not present

## 2019-11-13 DIAGNOSIS — E559 Vitamin D deficiency, unspecified: Secondary | ICD-10-CM

## 2019-11-13 NOTE — Progress Notes (Signed)
Chief Complaint: This 44 year old man comes in for an annual physical exam and to address his chronic medical conditions as well as a new problem. HPI: He is complaining of bilateral shoulder pain which occurs intermittently, not every day.  He does not relate this to eating food although he says that he gets reflux symptoms when he does eat food.  He denies any nausea or vomiting. He has been diagnosed with asthma and does see an allergist. He also has obstructive sleep apnea. He has obesity and I have been working with him over the years try and improve upon this.  He was not consistent in his nutrition but for the last 2 weeks, he has been more consistent, he is now up to fasting 14 hours every day and trying to eat healthier. He also started taking testosterone therapy 2 weeks ago again after a long hiatus. He continues on desiccated NP thyroid for symptoms of thyroid deficiency. He also continues on statin therapy for hyperlipidemia although he does not have a history of coronary artery disease.  Past Medical History:  Diagnosis Date  . Asthma with allergic rhinitis 03/11/2008  . Essential hypertension 05/06/2012  . Gastroesophageal reflux disease with hiatal hernia 05/06/2012  . Gastroesophageal reflux disease with hiatal hernia   . Hyperlipidemia LDL goal < 160 03/11/2008  . Irritable bowel syndrome with constipation and diarrhea 05/06/2012  . Migraine   . Obesity (BMI 30.0-34.9) 03/03/2013  . Obstructive sleep apnea 11/19/2008   Nocturnal polysomnography 12/14/2008: AHI 20.8, O2 Sat nadir 84%, optimal CPAP 12 cm H2O   . Urticaria    Past Surgical History:  Procedure Laterality Date  . URETHRAL DILATION       Social History   Social History Narrative   Works for [employer] in the [employer].  Married for 23 years, no children.   Right-handed.   Occasionally caffeine use.    Social History   Tobacco Use  . Smoking status: Never Smoker  . Smokeless tobacco: Never Used    Substance Use Topics  . Alcohol use: Yes    Comment: occ      Allergies:  Allergies  Allergen Reactions  . Other Hives    Tree nuts      Current Meds  Medication Sig  . azelastine (ASTELIN) 0.1 % nasal spray Place 2 sprays into both nostrils 2 (two) times daily.  . budesonide-formoterol (SYMBICORT) 80-4.5 MCG/ACT inhaler Inhale 2 puffs into the lungs 2 (two) times daily.  . Cholecalciferol (VITAMIN D3) 125 MCG (5000 UT) TABS Take by mouth.  . cholecalciferol (VITAMIN D3) 25 MCG (1000 UT) tablet Take 5,000 Units by mouth daily.  Marland Kitchen EPINEPHrine 0.3 mg/0.3 mL IJ SOAJ injection Inject 0.3 mLs (0.3 mg total) into the muscle as needed for anaphylaxis.  Marland Kitchen Fexofenadine-Pseudoephedrine (ALLEGRA-D 24 HOUR PO) Take by mouth daily.  Marland Kitchen levalbuterol (XOPENEX HFA) 45 MCG/ACT inhaler 4 puffs every 4-6 hours as needed.  Marland Kitchen losartan-hydrochlorothiazide (HYZAAR) 100-25 MG tablet Take 1 tablet by mouth daily.  . montelukast (SINGULAIR) 10 MG tablet TAKE 1 TABLET BY MOUTH ONCE A DAY  . NP THYROID 60 MG tablet Take 1 tablet by mouth once daily  . pantoprazole (PROTONIX) 40 MG tablet Take 1 tablet (40 mg total) by mouth daily.  . simvastatin (ZOCOR) 20 MG tablet Take 1 tablet (20 mg total) by mouth daily at 6 PM.  . SUMAtriptan (IMITREX) 50 MG tablet Take 1 tablet (50 mg total) by mouth every 2 (two) hours  as needed for migraine. May repeat in 2 hours if headache persists or recurs.  Marland Kitchen testosterone cypionate (DEPOTESTOSTERONE CYPIONATE) 200 MG/ML injection Inject 100 mg into the muscle once a week.      Nutrition Started fasting 14 hours every day a couple of weeks ago.  Sleep Adequate.  Exercise No consistent exercise at the present time.  Bio-identical hormones Testosterone therapy is being used off label for symptoms of testosterone deficiency and benefits that it produces based on several studies.  These benefits include decreasing body fat, increasing in lean muscle mass and increasing in  bone density.  There is improvement of memory, cognition.  There is improvement in exercise tolerance and endurance.  Testosterone therapy has also been shown to be protective against coronary artery disease, cerebrovascular disease, diabetes, hypertension and degenerative joint disease. I have discussed with the patient the FDA warnings regarding testosterone therapy, benefits and side effects and modes of administration as well as monitoring blood levels and side effects  on a regular basis The patient is agreeable that testosterone therapy should be an integral part of his/her wellness,quality of life and prevention of chronic disease.  This patient is being treated with desiccated thyroid, off label, for symptoms of thyroid deficiency.  The patient has been counseled regarding side effects and how to deal with them.  [id number] from the symptoms mentioned above,there are no other symptoms referable to all systems reviewed.  Physical Exam: Blood pressure 130/85, pulse 96, temperature 97.6 F (36.4 C), temperature source Temporal, height 5\' 10"  (1.778 m), weight 230 lb 6.4 oz (104.5 kg), SpO2 98 %. Vitals with BMI 11/13/2019 09/13/2019 07/26/2019  Height 5\' 10"  5' 9.5" -  Weight 230 lbs 6 oz 230 lbs 10 oz -  BMI [id number] -  Systolic [id number]  Diastolic 85 88 90  Pulse 96 97 88      He looks systemically well, remains obese. General: Alert, cooperative, and appears to be the stated age.No pallor.  No jaundice.  No clubbing. Head: Normocephalic Eyes: Sclera white, pupils equal and reactive to light, red reflex x 2,  Ears: Normal bilaterally Oral cavity: Lips, mucosa, and tongue normal: Teeth and gums normal Neck: No adenopathy, supple, symmetrical, trachea midline, and thyroid does not appear enlarged Respiratory: Clear to auscultation bilaterally.No wheezing, crackles or bronchial breathing. Cardiovascular: Heart sounds are present and appear to be normal without murmurs or added  sounds.  No carotid bruits.  Peripheral pulses are present and equal bilaterally.: Gastrointestinal:positive bowel sounds, no hepatosplenomegaly.  No masses felt.No tenderness, except for possible positive Murphy sign. Skin: Clear, No rashes noted.No worrisome skin lesions seen. Neurological: Grossly intact without focal findings, cranial nerves II through XII intact, muscle strength equal bilaterally Musculoskeletal: No acute joint abnormalities noted.Full range of movement noted with joints. Psychiatric: Affect appropriate, non-anxious.    Assessment  1. Obesity (BMI 30.0-34.9)   2. Moderate persistent asthma, uncomplicated   3. Mixed hyperlipidemia   4. Essential hypertension   5. Encounter for general adult medical examination with abnormal findings   6. Screening for diabetes mellitus   7. Vitamin D deficiency disease   8. Right upper quadrant abdominal tenderness without rebound tenderness     Tests Ordered:   Orders Placed This Encounter  Procedures  . CBC  . COMPLETE METABOLIC PANEL WITH GFR  . Hemoglobin A1c  . Lipid panel  . T3, free  . T4  . TSH  . VITAMIN D 25 Hydroxy (Vit-D Deficiency, Fractures)  Plan  1. Blood work is ordered as above. 2. In terms of his right upper quadrant tenderness, we will see what the blood work shows and he might need an ultrasound of the area. 3. His blood pressure could certainly be better but for the time being, he will continue with antihypertensive medications. 4. He will continue with statin therapy for hyperlipidemia although eventually I would like to remove this.  We will see what his lipid panel shows. 5. He will continue with PPI for his gastroesophageal reflux disease.  This appears to be stable. 6. His obesity needs to be improved and I will work with him closely to help him with this. 7. I will see him in about 6 weeks time for close follow-up. 8. Today, in addition to a preventative visit, I independently performed  an office visit to address the above concerns/diagnoses and symptoms.     No orders of the defined types were placed in this encounter.    Keila Turan C Gisele Pack   11/13/2019, 9:30 AM

## 2019-11-13 NOTE — Patient Instructions (Signed)
[facility] Dietary Recommendations for Weight Loss What to Avoid . Avoid added sugars o Often added sugar can be found in processed foods such as many condiments, dry cereals, cakes, cookies, chips, crisps, crackers, candies, sweetened drinks, etc.  o Read labels and AVOID/DECREASE use of foods with the following in their ingredient list: Sugar, fructose, high fructose corn syrup, sucrose, glucose, maltose, dextrose, molasses, cane sugar, brown sugar, any type of syrup, agave nectar, etc.   . Avoid snacking in between meals . Avoid foods made with flour o If you are going to eat food made with flour, choose those made with whole-grains; and, minimize your consumption as much as is tolerable . Avoid processed foods o These foods are generally stocked in the middle of the grocery store. Focus on shopping on the perimeter of the grocery.  . Avoid Meat  o We recommend following a plant-based diet at [facility]. Thus, we recommend avoiding meat as a general rule. Consider eating beans, legumes, eggs, and/or dairy products for regular protein sources o If you plan on eating meat limit to 4 ounces of meat at a time and choose lean options such as Fish, chicken, turkey. Avoid red meat intake such as pork and/or steak What to Include . Vegetables o GREEN LEAFY VEGETABLES: Kale, spinach, mustard greens, collard greens, cabbage, broccoli, etc. o OTHER: Asparagus, cauliflower, eggplant, carrots, peas, Brussel sprouts, tomatoes, bell peppers, zucchini, beets, cucumbers, etc. . Grains, seeds, and legumes o Beans: kidney beans, black eyed peas, garbanzo beans, black beans, pinto beans, etc. o Whole, unrefined grains: brown rice, barley, bulgur, oatmeal, etc. . Healthy fats  o Avoid highly processed fats such as vegetable oil o Examples of healthy fats: avocado, olives, virgin olive oil, dark chocolate (?72% Cocoa), nuts (peanuts, almonds, walnuts, cashews, pecans, etc.) . None to Low  Intake of Animal Sources of Protein o Meat sources: chicken, turkey, salmon, tuna. Limit to 4 ounces of meat at one time. o Consider limiting dairy sources, but when choosing dairy focus on: PLAIN Greek yogurt, cottage cheese, high-protein milk . Fruit o Choose berries  When to Eat . Intermittent Fasting: o Choosing not to eat for a specific time period, but DO FOCUS ON HYDRATION when fasting o Multiple Techniques: - Time Restricted Eating: eat 3 meals in a day, each meal lasting no more than 60 minutes, no snacks between meals - 16-18 hour fast: fast for 16 to 18 hours up to 7 days a week. Often suggested to start with 2-3 nonconsecutive days per week.  . Remember the time you sleep is counted as fasting.  . Examples of eating schedule: Fast from 7:00pm-11:00am. Eat between 11:00am-7:00pm.  - 24-hour fast: fast for 24 hours up to every other day. Often suggested to start with 1 day per week . Remember the time you sleep is counted as fasting . Examples of eating schedule:  o Eating day: eat 2-3 meals on your eating day. If doing 2 meals, each meal should last no more than 90 minutes. If doing 3 meals, each meal should last no more than 60 minutes. Finish last meal by 7:00pm. o Fasting day: Fast until 7:00pm.  o IF YOU FEEL UNWELL FOR ANY REASON/IN ANY WAY WHEN FASTING, STOP FASTING BY EATING A NUTRITIOUS SNACK OR LIGHT MEAL o ALWAYS FOCUS ON HYDRATION DURING FASTS - Acceptable Hydration sources: water, broths, tea/coffee (black tea/coffee is best but using a small amount of whole-fat dairy products in coffee/tea is acceptable).  -   Poor Hydration Sources: anything with sugar or artificial sweeteners added to it  These recommendations have been developed for patients that are actively receiving medical care from either Dr. Darrik Richman or Sarah Gray, DNP, NP-C at [facility]. These recommendations are developed for patients with specific medical conditions and are not meant to be  distributed or used by others that are not actively receiving care from either provider listed above at [facility]. It is not appropriate to participate in the above eating plans without proper medical supervision.   Reference: Fung, J. The obesity code. [place]: [place]; 2016.   

## 2019-11-14 ENCOUNTER — Other Ambulatory Visit (INDEPENDENT_AMBULATORY_CARE_PROVIDER_SITE_OTHER): Payer: Self-pay | Admitting: Internal Medicine

## 2019-11-14 LAB — COMPLETE METABOLIC PANEL WITH GFR
AG Ratio: 1.5 (calc) (ref 1.0–2.5)
ALT: 15 U/L (ref 9–46)
AST: 13 U/L (ref 10–40)
Albumin: 4.4 g/dL (ref 3.6–5.1)
Alkaline phosphatase (APISO): 60 U/L (ref 36–130)
BUN: 12 mg/dL (ref 7–25)
CO2: 28 mmol/L (ref 20–32)
Calcium: 9.8 mg/dL (ref 8.6–10.3)
Chloride: 102 mmol/L (ref 98–110)
Creat: 0.99 mg/dL (ref 0.60–1.35)
GFR, Est African American: 106 mL/min/{1.73_m2} (ref >=60)
GFR, Est Non African American: 92 mL/min/{1.73_m2} (ref >=60)
Globulin: 2.9 g/dL (calc) (ref 1.9–3.7)
Glucose, Bld: 104 mg/dL — ABNORMAL HIGH (ref 65–99)
Potassium: 4.3 mmol/L (ref 3.5–5.3)
Sodium: 139 mmol/L (ref 135–146)
Total Bilirubin: 0.6 mg/dL (ref 0.2–1.2)
Total Protein: 7.3 g/dL (ref 6.1–8.1)

## 2019-11-14 LAB — CBC
HCT: 49.5 % (ref 38.5–50.0)
Hemoglobin: 16.9 g/dL (ref 13.2–17.1)
MCH: 29 pg (ref 27.0–33.0)
MCHC: 34.1 g/dL (ref 32.0–36.0)
MCV: 85.1 fL (ref 80.0–100.0)
MPV: 10.4 fL (ref 7.5–12.5)
Platelets: 315 10*3/uL (ref 140–400)
RBC: 5.82 10*6/uL — ABNORMAL HIGH (ref 4.20–5.80)
RDW: 13.9 % (ref 11.0–15.0)
WBC: 8 10*3/uL (ref 3.8–10.8)

## 2019-11-14 LAB — HEMOGLOBIN A1C
Hgb A1c MFr Bld: 5.5 % of total Hgb (ref <5.7)
Mean Plasma Glucose: 111 (calc)
eAG (mmol/L): 6.2 (calc)

## 2019-11-14 LAB — VITAMIN D 25 HYDROXY (VIT D DEFICIENCY, FRACTURES): Vit D, 25-Hydroxy: 36 ng/mL (ref 30–100)

## 2019-11-14 LAB — LIPID PANEL
Cholesterol: 251 mg/dL — ABNORMAL HIGH (ref <200)
HDL: 39 mg/dL — ABNORMAL LOW (ref >=40)
LDL Cholesterol (Calc): 178 mg/dL (calc) — ABNORMAL HIGH
Non-HDL Cholesterol (Calc): 212 mg/dL (calc) — ABNORMAL HIGH (ref <130)
Total CHOL/HDL Ratio: 6.4 (calc) — ABNORMAL HIGH (ref <5.0)
Triglycerides: 185 mg/dL — ABNORMAL HIGH (ref <150)

## 2019-11-14 LAB — TSH: TSH: 2.99 mIU/L (ref 0.40–4.50)

## 2019-11-14 LAB — T3, FREE: T3, Free: 3.3 pg/mL (ref 2.3–4.2)

## 2019-11-14 LAB — T4: T4, Total: 8 ug/dL (ref 4.9–10.5)

## 2019-11-14 MED ORDER — THYROID 90 MG PO TABS: 90.0000 mg | ORAL_TABLET | Freq: Every day | ORAL | 3 refills | Status: DC

## 2019-11-14 MED FILL — NP THYROID 90 MG TABLET: 90 | 30 days supply | Qty: 30 | Fill #0

## 2019-11-21 MED FILL — PANTOPRAZOLE SOD DR 40 MG T: 40 | 30 days supply | Qty: 30 | Fill #1

## 2019-11-24 DIAGNOSIS — H52223 Regular astigmatism, bilateral: Secondary | ICD-10-CM | POA: Diagnosis not present

## 2019-12-04 ENCOUNTER — Other Ambulatory Visit (INDEPENDENT_AMBULATORY_CARE_PROVIDER_SITE_OTHER): Payer: Self-pay | Admitting: Internal Medicine

## 2019-12-04 MED ORDER — LOSARTAN POTASSIUM-HCTZ 100-25 MG PO TABS: 1.0000 | ORAL_TABLET | Freq: Every day | ORAL | 0 refills | Status: DC

## 2019-12-04 MED FILL — LOSARTAN-HCTZ 100-25 MG TAB: 100-25 | 30 days supply | Qty: 30 | Fill #0

## 2019-12-20 DIAGNOSIS — M25551 Pain in right hip: Secondary | ICD-10-CM | POA: Diagnosis not present

## 2019-12-20 DIAGNOSIS — M545 Low back pain: Secondary | ICD-10-CM | POA: Diagnosis not present

## 2019-12-26 ENCOUNTER — Ambulatory Visit (INDEPENDENT_AMBULATORY_CARE_PROVIDER_SITE_OTHER): Payer: 59 | Admitting: Internal Medicine

## 2020-01-08 MED FILL — LOSARTAN-HCTZ 100-25 MG TAB: 100-25 | 30 days supply | Qty: 30 | Fill #1

## 2020-01-08 MED FILL — PANTOPRAZOLE SOD DR 40 MG T: 40 | 30 days supply | Qty: 30 | Fill #2

## 2020-01-10 ENCOUNTER — Ambulatory Visit (INDEPENDENT_AMBULATORY_CARE_PROVIDER_SITE_OTHER): Payer: 59 | Admitting: Internal Medicine

## 2020-01-16 MED FILL — NP THYROID 90 MG TABLET: 90 | 30 days supply | Qty: 30 | Fill #1

## 2020-01-24 ENCOUNTER — Ambulatory Visit: Payer: 59 | Admitting: Allergy & Immunology

## 2020-01-24 ENCOUNTER — Other Ambulatory Visit: Payer: Self-pay

## 2020-01-24 ENCOUNTER — Encounter: Payer: Self-pay | Admitting: Allergy & Immunology

## 2020-01-24 VITALS — BP 126/80 | HR 94 | Temp 98.7°F | Resp 18 | Ht 70.0 in | Wt 210.0 lb

## 2020-01-24 DIAGNOSIS — J3089 Other allergic rhinitis: Secondary | ICD-10-CM

## 2020-01-24 DIAGNOSIS — J4540 Moderate persistent asthma, uncomplicated: Secondary | ICD-10-CM

## 2020-01-24 DIAGNOSIS — J302 Other seasonal allergic rhinitis: Secondary | ICD-10-CM

## 2020-01-24 DIAGNOSIS — T7800XD Anaphylactic reaction due to unspecified food, subsequent encounter: Secondary | ICD-10-CM | POA: Diagnosis not present

## 2020-01-24 NOTE — Patient Instructions (Addendum)
1. Moderate persistent asthma, uncomplicated - Lung testing deferred today.  - We are not going to make any changes, but we may be able to decrease medications as the allergy shots get more concentrated.  - Daily controller medication(s): Singulair 10mg  daily and Symbicort 80/4.38mcg two puffs twice daily with spacer - Prior to physical activity: Xopenex 2 puffs 10-15 minutes before physical activity. - Rescue medications: Xopenex 4 puffs every 4-6 hours as needed - Asthma control goals:  * Full participation in all desired activities (may need albuterol before activity) * Albuterol use two time or less a week on average (not counting use with activity) * Cough interfering with sleep two time or less a month * Oral steroids no more than once a year * No hospitalizations  2. Chronic rhinitis (grasses, ragweed, weeds, trees, indoor molds, outdoor molds and cat) - Start allergy shots on June 4th.  - Continue with: Singulair (montelukast) 10mg  daily and Flonase (fluticasone) two sprays per nostril daily Allegra (fexofenadine) 180mg  table once daily and Astelin (azelastine) 2 sprays per nostril 1-2 times daily as needed   3. Anaphylactic shock due to food - Introduce some tree nuts at home.  - With the negative blood and skin testing, this really makes it likely that you will tolerate them.    5. Return in about 6 months (around 07/26/2020). This can be an in-person, a virtual Webex or a telephone follow up visit.   Please inform us of any Emergency Department visits, hospitalizations, or changes in symptoms. Call us before going to the ED for breathing or allergy symptoms since we might be able to fit you in for a sick visit. Feel free to contact us anytime with any questions, problems, or concerns.  It was a pleasure to see you again today!  Websites that have reliable patient information: 1. American Academy of Asthma, Allergy, and Immunology: www.aaaai.org 2. Food Allergy Research and  Education (FARE): foodallergy.org 3. Mothers of Asthmatics: http://www.asthmacommunitynetwork.org 4. American College of Allergy, Asthma, and Immunology: www.acaai.org  "Like" [place] on Facebook and Instagram for our latest updates!      Make sure you are registered to vote! If you have moved or changed any of your contact information, you will need to get this updated before voting!  In some cases, you MAY be able to register to vote online: [email or web address]

## 2020-01-24 NOTE — Progress Notes (Signed)
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 Moderate persistent asthma, uncomplicated - Lung testing deferred today.  - We are not going to make any changes, but we may be able to decrease medications as the allergy shots get more concentrated.  - Daily controller medication(s): Singulair 10mg  daily and Symbicort 80/4.17mcg two puffs twice daily with spacer - Prior to physical activity: Xopenex 2 puffs 10-15 minutes before physical activity. - Rescue medications: Xopenex 4 puffs every 4-6 hours as needed - Asthma control goals:  * Full participation in all desired activities (may need albuterol before activity) * Albuterol use two time or less a week on average (not counting use with activity) * Cough interfering with sleep two time or less a month * Oral steroids no more than once a year * No hospitalizations  2. Chronic rhinitis (grasses, ragweed, weeds, trees, indoor molds, outdoor molds and cat) - Start allergy shots on June 4th.  - Continue with: Singulair (montelukast) 10mg  daily and Flonase (fluticasone) two sprays per nostril daily Allegra (fexofenadine) 180mg  table once daily and Astelin (azelastine) 2 sprays per nostril 1-2 times daily as needed   3. Anaphylactic shock due to food - Introduce some tree nuts at home.  - With the negative blood and skin testing, this really makes it likely that you will tolerate them.    5. Return in about 6 months (around 07/26/2020). This can be an in-person, a virtual Webex or a telephone follow up visit.  Subjective:   Sergio [name] is a 44 y.o. male presenting today for follow up of  Chief  Complaint  Patient presents with   Asthma    doing well with his asthma. His allergies are somewhat worse this month. He would like to get started on allergy injections.     Sergio [name] has a history of the following: Patient Active Problem List   Diagnosis Date Noted   Anaphylactic shock due to adverse food reaction 05/23/2019   Moderate persistent asthma, uncomplicated [id number]   Seasonal and perennial allergic rhinitis 05/23/2019   Chronic migraine 10/19/2018   Healthcare maintenance 05/13/2015   Pain of left thumb 04/12/2015   Obesity (BMI 30.0-34.9) 03/03/2013   Gastroesophageal reflux disease with hiatal hernia 05/06/2012   Essential hypertension 05/06/2012   Irritable bowel syndrome with constipation and diarrhea 05/06/2012   Obstructive sleep apnea 11/19/2008   Hyperlipidemia 03/11/2008   Asthma with allergic rhinitis 03/11/2008    History obtained from: chart review and patient.  Sergio [name] is a 44 y.o. male presenting for a follow up visit.  He was last seen in November 2020.  At that time, lung testing looked great.  He was continued on Symbicort 80/4.5 mcg 2 puffs twice daily as well as Singulair 10 mg daily.  He was also continued on Xopenex as needed.  For his rhinitis, he was continued on Singulair as well as Flonase and Allegra and Astelin.  Allergy shots were discussed.  We did start him on doxycycline twice a day for 10 days for bacterial sinusitis.  We recommended introducing tree nuts at home.   Since last visit, he has mostly done well. He is here today to discuss starting allergy shots.  Asthma/Respiratory Symptom History: He remains on the Symbicort two puffs in the morning and two puffs at night. He is also on Singulair. His albuterol use has been decreased with the use of the Symbicort.  Sergio [name]'s asthma has been well controlled. He has not required rescue medication, experienced nocturnal awakenings due to lower respiratory symptoms, nor have  activities of daily living been limited. He has required no Emergency Department or Urgent Care visits for his asthma. He has required zero courses of systemic steroids for asthma exacerbations since the last visit. ACT score today is 22, indicating excellent asthma symptom control.   Allergic Rhinitis Symptom History: He remains on all of his allergy medications which have provided some relief. However, he is interested in taking the next step and starting allergy shots. He is going to get them in [name], although he works in [employer] [name], but he has a flexible job with [name] that will allow him to leave early one day a week to get this done. He did complete the course of doxycycline and felt better after that.    He got his two COVID19 vaccinations. He is working on getting all of his family vaccinated as well. His mother was the hold out, although she was not looking forward to it since she does not like needles.   Otherwise, there have been no changes to his past medical history, surgical history, family history, or social history.    Review of Systems  Constitutional: Negative.  Negative for fever, malaise/fatigue and weight loss.  HENT: Positive for congestion. Negative for ear discharge and ear pain.        Positive for postnasal drip.  Eyes: Negative for pain, discharge and redness.  Respiratory: Negative for cough, sputum production, shortness of breath and wheezing.   Cardiovascular: Negative.  Negative for chest pain and palpitations.  Gastrointestinal: Negative for abdominal pain, heartburn, nausea and vomiting.  Skin: Negative.  Negative for itching and rash.  Neurological: Negative for dizziness and headaches.  Endo/Heme/Allergies: Negative for environmental allergies. Does not bruise/bleed easily.       Objective:   Blood pressure 126/80, pulse 94, temperature 98.7 F (37.1 C), temperature source Temporal, resp. rate 18, height 5\' 10"  (1.778 m), weight 210 lb (95.3  kg), SpO2 95 %. Body mass index is 30.13 kg/m.   Physical Exam:  Physical Exam  Constitutional: He appears well-developed.  HENT:  Head: Normocephalic and atraumatic.  Right Ear: Tympanic membrane, external ear and ear canal normal.  Left Ear: Tympanic membrane, external ear and ear canal normal.  Nose: Mucosal edema and rhinorrhea present. No nasal deformity or septal deviation. No epistaxis. Right sinus exhibits no maxillary sinus tenderness and no frontal sinus tenderness. Left sinus exhibits no maxillary sinus tenderness and no frontal sinus tenderness.  Mouth/Throat: Uvula is midline and oropharynx is clear and moist. Mucous membranes are not pale and not dry.  Tonsils enlarged bilaterally. No exudates noted.   Eyes: Pupils are equal, round, and reactive to light. Conjunctivae and EOM are normal. Right eye exhibits no chemosis and no discharge. Left eye exhibits no chemosis and no discharge. Right conjunctiva is not injected. Left conjunctiva is not injected.  Cardiovascular: Normal rate, regular rhythm and normal heart sounds.  Respiratory: Effort normal and breath sounds normal. No accessory muscle usage. No tachypnea. No respiratory distress. He has no wheezes. He has no rhonchi. He has no rales. He exhibits no tenderness.  Moving air well in all lung fields.   Lymphadenopathy:  He has no cervical adenopathy.  Neurological: He is alert.  Skin: No abrasion, no petechiae and no rash noted. Rash is not papular, not vesicular and not urticarial. No erythema. No pallor.  Psychiatric: He has a normal mood and affect.     Diagnostic studies: none       Salvatore Marvel, MD  [facility]

## 2020-01-25 ENCOUNTER — Encounter: Payer: Self-pay | Admitting: Allergy & Immunology

## 2020-01-25 ENCOUNTER — Ambulatory Visit (INDEPENDENT_AMBULATORY_CARE_PROVIDER_SITE_OTHER): Payer: 59 | Admitting: Internal Medicine

## 2020-01-25 DIAGNOSIS — J3081 Allergic rhinitis due to animal (cat) (dog) hair and dander: Secondary | ICD-10-CM | POA: Diagnosis not present

## 2020-01-25 NOTE — Progress Notes (Signed)
VIALS EXP 01-24-21

## 2020-01-29 ENCOUNTER — Other Ambulatory Visit (INDEPENDENT_AMBULATORY_CARE_PROVIDER_SITE_OTHER): Payer: Self-pay | Admitting: Internal Medicine

## 2020-01-29 DIAGNOSIS — J302 Other seasonal allergic rhinitis: Secondary | ICD-10-CM | POA: Diagnosis not present

## 2020-01-29 DIAGNOSIS — J4520 Mild intermittent asthma, uncomplicated: Secondary | ICD-10-CM

## 2020-01-29 MED FILL — MONTELUKAST SOD 10 MG TAB: 10 | 90 days supply | Qty: 90 | Fill #0

## 2020-02-16 ENCOUNTER — Ambulatory Visit: Payer: Self-pay

## 2020-02-21 ENCOUNTER — Ambulatory Visit (INDEPENDENT_AMBULATORY_CARE_PROVIDER_SITE_OTHER): Payer: 59 | Admitting: Internal Medicine

## 2020-02-21 ENCOUNTER — Telehealth (INDEPENDENT_AMBULATORY_CARE_PROVIDER_SITE_OTHER): Payer: Self-pay

## 2020-02-28 MED FILL — LOSARTAN-HCTZ 100-25 MG TAB: 100-25 | 30 days supply | Qty: 30 | Fill #2

## 2020-03-07 NOTE — Telephone Encounter (Signed)
Status appt note.

## 2020-03-08 MED FILL — NP THYROID 90 MG TABLET: 90 | 30 days supply | Qty: 30 | Fill #2

## 2020-03-08 MED FILL — PANTOPRAZOLE SOD DR 40 MG T: 40 | 30 days supply | Qty: 30 | Fill #0

## 2020-03-11 ENCOUNTER — Other Ambulatory Visit (INDEPENDENT_AMBULATORY_CARE_PROVIDER_SITE_OTHER): Payer: Self-pay

## 2020-03-11 DIAGNOSIS — K449 Diaphragmatic hernia without obstruction or gangrene: Secondary | ICD-10-CM

## 2020-03-11 DIAGNOSIS — K219 Gastro-esophageal reflux disease without esophagitis: Secondary | ICD-10-CM

## 2020-03-11 MED ORDER — PANTOPRAZOLE SODIUM 40 MG PO TBEC: 40.0000 mg | DELAYED_RELEASE_TABLET | Freq: Every day | ORAL | 0 refills | Status: DC

## 2020-04-01 ENCOUNTER — Other Ambulatory Visit (INDEPENDENT_AMBULATORY_CARE_PROVIDER_SITE_OTHER): Payer: Self-pay | Admitting: Nurse Practitioner

## 2020-04-01 MED FILL — NP THYROID 90 MG TABLET: 90 | 30 days supply | Qty: 30 | Fill #3

## 2020-04-01 MED FILL — LOSARTAN-HCTZ 100-25 MG TAB: 100-25 | 30 days supply | Qty: 30 | Fill #0

## 2020-04-02 ENCOUNTER — Encounter (INDEPENDENT_AMBULATORY_CARE_PROVIDER_SITE_OTHER): Payer: Self-pay

## 2020-04-02 NOTE — Telephone Encounter (Signed)
Called patient but mailbox is full and couldn't leave a detailed message.  I sent patient a detailed message in mychart to call the office to set up a follow up appointment.

## 2020-04-11 ENCOUNTER — Encounter: Payer: Self-pay | Admitting: Allergy & Immunology

## 2020-04-17 NOTE — Telephone Encounter (Signed)
Patient said he read his MyChart message about the dosage of medication that Dr. Ernst Bowler prescribed, but it doesn't say what the medication is. He went to [facility] and there was no medication there.

## 2020-04-18 ENCOUNTER — Other Ambulatory Visit: Payer: Self-pay | Admitting: *Deleted

## 2020-04-18 MED ORDER — PREDNISONE 10 MG PO TABS
ORAL_TABLET | ORAL | 0 refills | Status: DC
Start: 2020-04-18 — End: 2020-07-11

## 2020-04-18 MED FILL — predniSONE 10 MG TABS: 10 | 6 days supply | Qty: 18 | Fill #0

## 2020-04-22 MED FILL — PANTOPRAZOLE SOD DR 40 MG T: 40 | 30 days supply | Qty: 30 | Fill #1

## 2020-05-01 MED FILL — PANTOPRAZOLE SOD DR 40 MG T: 40 | 30 days supply | Qty: 30 | Fill #0

## 2020-05-07 ENCOUNTER — Other Ambulatory Visit (INDEPENDENT_AMBULATORY_CARE_PROVIDER_SITE_OTHER): Payer: Self-pay | Admitting: Internal Medicine

## 2020-05-07 ENCOUNTER — Ambulatory Visit (INDEPENDENT_AMBULATORY_CARE_PROVIDER_SITE_OTHER): Payer: 59 | Admitting: Internal Medicine

## 2020-05-07 ENCOUNTER — Other Ambulatory Visit (INDEPENDENT_AMBULATORY_CARE_PROVIDER_SITE_OTHER): Payer: Self-pay | Admitting: Nurse Practitioner

## 2020-05-07 DIAGNOSIS — J4520 Mild intermittent asthma, uncomplicated: Secondary | ICD-10-CM

## 2020-05-07 DIAGNOSIS — M25512 Pain in left shoulder: Secondary | ICD-10-CM | POA: Diagnosis not present

## 2020-05-07 DIAGNOSIS — S29011A Strain of muscle and tendon of front wall of thorax, initial encounter: Secondary | ICD-10-CM | POA: Diagnosis not present

## 2020-05-07 MED FILL — predniSONE 10 MG TABS: 10 | 5 days supply | Qty: 14 | Fill #0

## 2020-05-07 MED FILL — METHOCARBAMOL 500 MG TABS: 500 | 7 days supply | Qty: 21 | Fill #0

## 2020-05-07 MED FILL — MONTELUKAST SOD 10 MG TAB: 10 | 90 days supply | Qty: 90 | Fill #0

## 2020-05-07 MED FILL — LOSARTAN-HCTZ 100-25 MG TAB: 100-25 | 90 days supply | Qty: 90 | Fill #0

## 2020-05-07 MED FILL — NP THYROID 90 MG TABLET: 90 | 30 days supply | Qty: 30 | Fill #0

## 2020-05-13 ENCOUNTER — Ambulatory Visit (INDEPENDENT_AMBULATORY_CARE_PROVIDER_SITE_OTHER): Payer: 59 | Admitting: Nurse Practitioner

## 2020-05-13 MED FILL — AZELASTINE HCL 137 MCG/SPRA: 137 | 25 days supply | Qty: 30 | Fill #2

## 2020-05-14 DIAGNOSIS — M25512 Pain in left shoulder: Secondary | ICD-10-CM | POA: Diagnosis not present

## 2020-05-14 MED FILL — IBUPROFEN 800 MG TAB: 800 | 20 days supply | Qty: 60 | Fill #0

## 2020-05-28 ENCOUNTER — Other Ambulatory Visit (INDEPENDENT_AMBULATORY_CARE_PROVIDER_SITE_OTHER): Payer: Self-pay | Admitting: Nurse Practitioner

## 2020-05-28 ENCOUNTER — Encounter (INDEPENDENT_AMBULATORY_CARE_PROVIDER_SITE_OTHER): Payer: Self-pay | Admitting: Nurse Practitioner

## 2020-05-28 ENCOUNTER — Ambulatory Visit (INDEPENDENT_AMBULATORY_CARE_PROVIDER_SITE_OTHER): Payer: 59 | Admitting: Nurse Practitioner

## 2020-05-28 ENCOUNTER — Other Ambulatory Visit: Payer: Self-pay

## 2020-05-28 VITALS — BP 137/80 | HR 78 | Temp 97.9°F | Resp 18 | Ht 70.0 in | Wt 222.6 lb

## 2020-05-28 DIAGNOSIS — R079 Chest pain, unspecified: Secondary | ICD-10-CM

## 2020-05-28 DIAGNOSIS — E559 Vitamin D deficiency, unspecified: Secondary | ICD-10-CM | POA: Diagnosis not present

## 2020-05-28 DIAGNOSIS — I10 Essential (primary) hypertension: Secondary | ICD-10-CM | POA: Diagnosis not present

## 2020-05-28 DIAGNOSIS — R5383 Other fatigue: Secondary | ICD-10-CM

## 2020-05-28 DIAGNOSIS — E782 Mixed hyperlipidemia: Secondary | ICD-10-CM

## 2020-05-28 MED ORDER — SIMVASTATIN 40 MG PO TABS: 40.0000 mg | ORAL_TABLET | Freq: Every day | ORAL | 3 refills | Status: DC

## 2020-05-28 MED FILL — SIMVASTATIN 40 MG TABLET: 40 | 90 days supply | Qty: 90 | Fill #0

## 2020-05-28 NOTE — Progress Notes (Signed)
Subjective:  Patient ID: Sergio [name], male    DOB: 11-17-1975  Age: 44 y.o. MRN: [id number]  CC:  Chief Complaint  Patient presents with  . Follow-up  . Other    Vitamin D deficiency, chest pain  . Hyperlipidemia  . Hypertension  . Fatigue      HPI  This patient arrives today for the above.  Vitamin D deficiency: He continues on 10,000 IUs of vitamin D3.   Chest pain: He tells me approximately 1 month ago he was helping move furniture, and then the next day he coughed to clear his throat and then started experiencing left-sided chest pain that radiated to his left shoulder and arm.  He did go to urgent care for evaluation and they were concerned about cardiac etiology, however based on history determined it was most likely a pulled muscle and he was treated for such.  Since then, he tells me his pain has resolved and has not returned.  He tells me that the pain he experienced was pretty constant for a little over a week and then slowly got better.  He has not been experiencing any shortness of breath, and he tells me activity did not elicit or worsen the pain nor did rest improve the pain.  Hyperlipidemia: Last lipid panel shows LDL of 178.  Patient continues on simvastatin 20 mg.  Hypertension: He continues on losartan hydrochlorothiazide, and is tolerating his medications well.  Fatigue: He is on desiccated thyroid for the off label treatment of his fatigue and tells me he is tolerating the thyroid medications well.  Dose was increased to 90 mg at last office visit.  Past Medical History:  Diagnosis Date  . Asthma with allergic rhinitis 03/11/2008  . Essential hypertension 05/06/2012  . Gastroesophageal reflux disease with hiatal hernia 05/06/2012  . Gastroesophageal reflux disease with hiatal hernia   . Hyperlipidemia LDL goal < 160 03/11/2008  . Irritable bowel syndrome with constipation and diarrhea 05/06/2012  . Migraine   . Obesity (BMI 30.0-34.9) 03/03/2013  .  Obstructive sleep apnea 11/19/2008   Nocturnal polysomnography 12/14/2008: AHI 20.8, O2 Sat nadir 84%, optimal CPAP 12 cm H2O   . Urticaria       Family History  Problem Relation Age of Onset  . Early death Father        Accident  . Hypertension Maternal Grandmother   . Vaginal cancer Maternal Grandmother   . Parkinson's disease Maternal Grandmother   . Diabetes Paternal Grandfather   . ALS Paternal Grandfather   . Breast cancer Paternal Grandmother   . Kidney cancer Paternal Grandmother   . Healthy Mother   . Healthy Sister   . Healthy Sister   . Hypertension Maternal Uncle   . Diabetes Paternal Aunt   . COPD Maternal Grandfather   . Urticaria Neg Hx   . Immunodeficiency Neg Hx   . Eczema Neg Hx   . Atopy Neg Hx   . Angioedema Neg Hx   . Allergic rhinitis Neg Hx   . Asthma Neg Hx     Social History   Social History Narrative   Works for [employer] in the [employer].  Married for 23 years, no children.   Right-handed.   Occasionally caffeine use.   Social History   Tobacco Use  . Smoking status: Never Smoker  . Smokeless tobacco: Never Used  Substance Use Topics  . Alcohol use: Yes    Comment: occ  Current Meds  Medication Sig  . azelastine (ASTELIN) 0.1 % nasal spray Place 2 sprays into both nostrils 2 (two) times daily.  . budesonide-formoterol (SYMBICORT) 80-4.5 MCG/ACT inhaler Inhale 2 puffs into the lungs 2 (two) times daily.  . Cholecalciferol (VITAMIN D3) 125 MCG (5000 UT) TABS Take by mouth.  . EPINEPHrine 0.3 mg/0.3 mL IJ SOAJ injection Inject 0.3 mLs (0.3 mg total) into the muscle as needed for anaphylaxis.  Marland Kitchen Fexofenadine-Pseudoephedrine (ALLEGRA-D 24 HOUR PO) Take by mouth daily.  Marland Kitchen levalbuterol (XOPENEX HFA) 45 MCG/ACT inhaler 4 puffs every 4-6 hours as needed.  Marland Kitchen losartan-hydrochlorothiazide (HYZAAR) 100-25 MG tablet TAKE 1 TABLET BY MOUTH DAILY  . montelukast (SINGULAIR) 10 MG tablet TAKE 1 TABLET BY MOUTH ONCE A DAY  . NP THYROID 90 MG  tablet TAKE 1 TABLET BY MOUTH DAILY  . pantoprazole (PROTONIX) 40 MG tablet Take 1 tablet (40 mg total) by mouth daily.  . SUMAtriptan (IMITREX) 50 MG tablet Take 1 tablet (50 mg total) by mouth every 2 (two) hours as needed for migraine. May repeat in 2 hours if headache persists or recurs.  Marland Kitchen testosterone cypionate (DEPOTESTOSTERONE CYPIONATE) 200 MG/ML injection Inject 100 mg into the muscle once a week.   . [DISCONTINUED] budesonide-formoterol (SYMBICORT) 80-4.5 MCG/ACT inhaler Take 1 puff by mouth as needed.  . [DISCONTINUED] simvastatin (ZOCOR) 20 MG tablet Take 1 tablet (20 mg total) by mouth daily at 6 PM.    ROS:  See HPI   Objective:   Today's Vitals: BP 137/80 (BP Location: Right Arm, Patient Position: Sitting, Cuff Size: Normal)   Pulse 78   Temp 97.9 F (36.6 C) (Temporal)   Resp 18   Ht 5\' 10"  (1.778 m)   Wt 222 lb 9.6 oz (101 kg)   SpO2 98%   BMI 31.94 kg/m  Vitals with BMI 05/28/2020 01/24/2020 11/13/2019  Height 5\' 10"  5\' 10"  5\' 10"   Weight 222 lbs 10 oz 210 lbs 230 lbs 6 oz  BMI 31.94 62.83 66.29  Systolic 476 546 503  Diastolic 80 80 85  Pulse 78 94 96     Physical Exam Vitals reviewed.  Constitutional:      Appearance: Normal appearance.  HENT:     Head: Normocephalic and atraumatic.  Cardiovascular:     Rate and Rhythm: Normal rate and regular rhythm.  Pulmonary:     Effort: Pulmonary effort is normal.     Breath sounds: Normal breath sounds.  Musculoskeletal:     Cervical back: Neck supple.  Skin:    General: Skin is warm and dry.  Neurological:     Mental Status: He is alert and oriented to person, place, and time.  Psychiatric:        Mood and Affect: Mood normal.        Behavior: Behavior normal.        Thought Content: Thought content normal.        Judgment: Judgment normal.          Assessment and Plan   1. Mixed hyperlipidemia   2. Fatigue, unspecified type   3. Vitamin D deficiency disease   4. Essential hypertension   5.  Chest pain, unspecified type      Plan: 1.  I did discuss his lipid panel with him and that I would recommend increasing his simvastatin to 40 mg daily.  He is agreeable to this plan.  We will send prescription to pharmacy and he will return in 6 weeks for  follow-up and to recheck CMP with lipid panel. 2.  We will check thyroid panel today to monitor. 3.  He will continue on his vitamin D3 supplement we will consider checking serum level at next office visit. 4.  He will continue on his current antihypertensives. 5.  Chest pain has not reoccurred and history of pain as well as the history of the progression of improvement of the pain makes me think this also was a strained muscle.  For now we will continue to monitor him, but he was told if he experiences any repeat symptoms he needs to call our office or if we are not available proceed to the emergency department.  Tests ordered Orders Placed This Encounter  Procedures  . TSH  . T3, Free  . T4, Free      Meds ordered this encounter  Medications  . simvastatin (ZOCOR) 40 MG tablet    Sig: Take 1 tablet (40 mg total) by mouth at bedtime.    Dispense:  90 tablet    Refill:  3    Order Specific Question:   Supervising Provider    Answer:   Doree Albee [[id number]]    Patient to follow-up in 6 weeks or sooner as needed.  Ailene Ards, NP

## 2020-05-29 LAB — T3, FREE: T3, Free: 4 pg/mL (ref 2.3–4.2)

## 2020-05-29 LAB — TSH: TSH: 0.61 mIU/L (ref 0.40–4.50)

## 2020-05-29 LAB — T4, FREE: Free T4: 1.1 ng/dL (ref 0.8–1.8)

## 2020-06-13 ENCOUNTER — Other Ambulatory Visit (INDEPENDENT_AMBULATORY_CARE_PROVIDER_SITE_OTHER): Payer: Self-pay

## 2020-06-13 ENCOUNTER — Other Ambulatory Visit (INDEPENDENT_AMBULATORY_CARE_PROVIDER_SITE_OTHER): Payer: Self-pay | Admitting: Nurse Practitioner

## 2020-06-13 DIAGNOSIS — K219 Gastro-esophageal reflux disease without esophagitis: Secondary | ICD-10-CM

## 2020-06-13 DIAGNOSIS — K449 Diaphragmatic hernia without obstruction or gangrene: Secondary | ICD-10-CM

## 2020-06-13 MED ORDER — PANTOPRAZOLE SODIUM 40 MG PO TBEC: 40.0000 mg | DELAYED_RELEASE_TABLET | Freq: Every day | ORAL | 0 refills | Status: DC

## 2020-06-13 MED FILL — NP THYROID 90 MG TABLET: 90 | 30 days supply | Qty: 30 | Fill #1

## 2020-06-13 MED FILL — PANTOPRAZOLE SOD DR 40 MG T: 40 | 30 days supply | Qty: 30 | Fill #1

## 2020-07-03 ENCOUNTER — Encounter (INDEPENDENT_AMBULATORY_CARE_PROVIDER_SITE_OTHER): Payer: Self-pay | Admitting: Nurse Practitioner

## 2020-07-03 ENCOUNTER — Other Ambulatory Visit (INDEPENDENT_AMBULATORY_CARE_PROVIDER_SITE_OTHER): Payer: Self-pay | Admitting: Internal Medicine

## 2020-07-03 DIAGNOSIS — G43909 Migraine, unspecified, not intractable, without status migrainosus: Secondary | ICD-10-CM

## 2020-07-04 ENCOUNTER — Telehealth: Payer: Self-pay | Admitting: Neurology

## 2020-07-04 NOTE — Telephone Encounter (Signed)
Ok to switch to Dr. Jaynee Eagles

## 2020-07-04 NOTE — Telephone Encounter (Signed)
Patient has already been seen by physician and his follow up appointment is with an NP. Have him follow up with NP and if NP needs help they can talk to me about him. We work as a Therapist, occupational, NPs are trained in headache management, if Judson Roch has a question she can see me to discuss. This is what I do with my patients as well. But I don;t have anymore to offer than Dr. Krista Blue does and I agree with her assessment and plan. I don;t have anymore to offer. Again, if Judson Roch needs help I am more than happy to work with Judson Roch to treat patient.  thanks

## 2020-07-04 NOTE — Telephone Encounter (Signed)
Pt called would like to be seen by the same physician my wife sees. I would like to be changed to Dr. Jaynee Eagles. I Deneise Lever) informed Pt will send your request to the physicians and they will discuss your request and let you know of their decision. Pt confirmed he understood.

## 2020-07-05 NOTE — Telephone Encounter (Signed)
Contacted Pt, inform Pt of telephone note sent by Dr. Jaynee Eagles. Pt agreed to see NP, scheduled Pt with [place] Roch.

## 2020-07-09 ENCOUNTER — Ambulatory Visit (INDEPENDENT_AMBULATORY_CARE_PROVIDER_SITE_OTHER): Payer: 59 | Admitting: Nurse Practitioner

## 2020-07-11 ENCOUNTER — Other Ambulatory Visit (INDEPENDENT_AMBULATORY_CARE_PROVIDER_SITE_OTHER): Payer: Self-pay | Admitting: Nurse Practitioner

## 2020-07-11 ENCOUNTER — Telehealth (INDEPENDENT_AMBULATORY_CARE_PROVIDER_SITE_OTHER): Payer: Self-pay | Admitting: Nurse Practitioner

## 2020-07-11 ENCOUNTER — Other Ambulatory Visit: Payer: Self-pay

## 2020-07-11 ENCOUNTER — Encounter (INDEPENDENT_AMBULATORY_CARE_PROVIDER_SITE_OTHER): Payer: Self-pay | Admitting: Nurse Practitioner

## 2020-07-11 ENCOUNTER — Ambulatory Visit (INDEPENDENT_AMBULATORY_CARE_PROVIDER_SITE_OTHER): Payer: 59 | Admitting: Nurse Practitioner

## 2020-07-11 VITALS — BP 114/80 | HR 77 | Temp 97.5°F | Ht 70.0 in | Wt 223.0 lb

## 2020-07-11 DIAGNOSIS — E782 Mixed hyperlipidemia: Secondary | ICD-10-CM | POA: Diagnosis not present

## 2020-07-11 DIAGNOSIS — Z049 Encounter for examination and observation for unspecified reason: Secondary | ICD-10-CM | POA: Diagnosis not present

## 2020-07-11 DIAGNOSIS — G43719 Chronic migraine without aura, intractable, without status migrainosus: Secondary | ICD-10-CM | POA: Diagnosis not present

## 2020-07-11 DIAGNOSIS — E559 Vitamin D deficiency, unspecified: Secondary | ICD-10-CM

## 2020-07-11 DIAGNOSIS — G43901 Migraine, unspecified, not intractable, with status migrainosus: Secondary | ICD-10-CM | POA: Diagnosis not present

## 2020-07-11 DIAGNOSIS — R5383 Other fatigue: Secondary | ICD-10-CM | POA: Diagnosis not present

## 2020-07-11 DIAGNOSIS — Z79899 Other long term (current) drug therapy: Secondary | ICD-10-CM | POA: Diagnosis not present

## 2020-07-11 DIAGNOSIS — F419 Anxiety disorder, unspecified: Secondary | ICD-10-CM

## 2020-07-11 DIAGNOSIS — G43919 Migraine, unspecified, intractable, without status migrainosus: Secondary | ICD-10-CM | POA: Diagnosis not present

## 2020-07-11 MED ORDER — BUSPIRONE HCL 5 MG PO TABS: 5.0000 mg | ORAL_TABLET | Freq: Three times a day (TID) | ORAL | 1 refills | Status: DC

## 2020-07-11 MED FILL — ZONISAMIDE 25 MG CAPSULE: 25 | 30 days supply | Qty: 120 | Fill #0

## 2020-07-11 MED FILL — DICLOFENAC SOD EC 50 MG TAB: 50 | 3 days supply | Qty: 6 | Fill #0

## 2020-07-11 MED FILL — busPIRone HCL 5 MG TABS: 5 | 30 days supply | Qty: 90 | Fill #0

## 2020-07-11 MED FILL — BACLOFEN 10 MG TABS: 10 | 30 days supply | Qty: 20 | Fill #0

## 2020-07-11 NOTE — Telephone Encounter (Signed)
When you get back from your conference we order this patient testosterone but as opposed to injection he would like to try cream.  I have already discussed that cream is to be applied daily, and recommended that it is applied to the scrotum, inner thigh, or inner arm.  He was also told to make sure that it is not on skin that can possibly come in contact with other people.  We also discussed that it is usually prescribed in clicks and had to dispense it in that format.  Thank you.

## 2020-07-11 NOTE — Progress Notes (Signed)
Subjective:  Patient ID: Sergio [name], male    DOB: 05-Feb-1976  Age: 44 y.o. MRN: [id number]  CC:  Chief Complaint  Patient presents with  . Follow-up    Doing okay  . Anxiety  . Other    Vitamin D deficiency  . Hyperlipidemia  . Hypertension      HPI  This patient arrives today for the above.  Anxiety: He tells me has been feeling more irritable and has been having cyclical thoughts about minor anxiety triggers.  He is wondering if there is any medication he can try to improve his mood.  Vitamin D deficiency: He continues on vitamin D3 supplement is due to have serum check today.  Hyperlipidemia: Simvastatin dose was increased at last office visit he is due to check CMP and lipid panel today.  Last LDL was 178 when he was on simvastatin 20 mg daily.  Hypertension: He continues on losartan and hydrochlorothiazide is tolerating these well.  Fatigue: He continues on desiccated thyroid off label for treatment of his fatigue, last thyroid panel was checked a few weeks ago and everything was within the normal range.  He also has been on testosterone for off label treatment of his fatigue, but tells me has not been taking this regularly.  He was taking IM injections, but wants to try topical treatment.  Of note, he does have chronic migraines and is now seeing Dr. Orie Rout at the headache clinic.  He tells me he had a very good experience seeing his doctor and looks forward to working with him to treat his headaches.  Past Medical History:  Diagnosis Date  . Asthma with allergic rhinitis 03/11/2008  . Essential hypertension 05/06/2012  . Gastroesophageal reflux disease with hiatal hernia 05/06/2012  . Gastroesophageal reflux disease with hiatal hernia   . Hyperlipidemia LDL goal < 160 03/11/2008  . Irritable bowel syndrome with constipation and diarrhea 05/06/2012  . Migraine   . Obesity (BMI 30.0-34.9) 03/03/2013  . Obstructive sleep apnea 11/19/2008   Nocturnal  polysomnography 12/14/2008: AHI 20.8, O2 Sat nadir 84%, optimal CPAP 12 cm H2O   . Urticaria       Family History  Problem Relation Age of Onset  . Early death Father        Accident  . Hypertension Maternal Grandmother   . Vaginal cancer Maternal Grandmother   . Parkinson's disease Maternal Grandmother   . Diabetes Paternal Grandfather   . ALS Paternal Grandfather   . Breast cancer Paternal Grandmother   . Kidney cancer Paternal Grandmother   . Healthy Mother   . Healthy Sister   . Healthy Sister   . Hypertension Maternal Uncle   . Diabetes Paternal Aunt   . COPD Maternal Grandfather   . Urticaria Neg Hx   . Immunodeficiency Neg Hx   . Eczema Neg Hx   . Atopy Neg Hx   . Angioedema Neg Hx   . Allergic rhinitis Neg Hx   . Asthma Neg Hx     Social History   Social History Narrative   Works for [employer] in the [employer].  Married for 23 years, no children.   Right-handed.   Occasionally caffeine use.   Social History   Tobacco Use  . Smoking status: Never Smoker  . Smokeless tobacco: Never Used  Substance Use Topics  . Alcohol use: Yes    Comment: occ     Current Meds  Medication Sig  . azelastine (ASTELIN)  0.1 % nasal spray Place 2 sprays into both nostrils 2 (two) times daily.  . baclofen (LIORESAL) 10 MG tablet Take 10 mg by mouth 2 (two) times daily. Max of 1-2 treatments weekly  . budesonide-formoterol (SYMBICORT) 80-4.5 MCG/ACT inhaler Inhale 2 puffs into the lungs 2 (two) times daily.  . Cholecalciferol (VITAMIN D3) 125 MCG (5000 UT) TABS Take by mouth.  . diclofenac (VOLTAREN) 50 MG EC tablet Take 50 mg by mouth 2 (two) times daily. Only for 3 days  . EPINEPHrine 0.3 mg/0.3 mL IJ SOAJ injection Inject 0.3 mLs (0.3 mg total) into the muscle as needed for anaphylaxis.  Marland Kitchen Fexofenadine-Pseudoephedrine (ALLEGRA-D 24 HOUR PO) Take by mouth daily.  Marland Kitchen levalbuterol (XOPENEX HFA) 45 MCG/ACT inhaler 4 puffs every 4-6 hours as needed.  Marland Kitchen  losartan-hydrochlorothiazide (HYZAAR) 100-25 MG tablet TAKE 1 TABLET BY MOUTH DAILY  . montelukast (SINGULAIR) 10 MG tablet TAKE 1 TABLET BY MOUTH ONCE A DAY  . NP THYROID 90 MG tablet TAKE 1 TABLET BY MOUTH DAILY  . pantoprazole (PROTONIX) 40 MG tablet Take 1 tablet (40 mg total) by mouth daily.  . simvastatin (ZOCOR) 40 MG tablet Take 1 tablet (40 mg total) by mouth at bedtime.  . SUMAtriptan (IMITREX) 50 MG tablet Take 1 tablet (50 mg total) by mouth every 2 (two) hours as needed for migraine. May repeat in 2 hours if headache persists or recurs.  Marland Kitchen testosterone cypionate (DEPOTESTOSTERONE CYPIONATE) 200 MG/ML injection Inject 100 mg into the muscle once a week.   . zonisamide (ZONEGRAN) 25 MG capsule Take 100 mg by mouth daily.    ROS:  See HPI   Objective:   Today's Vitals: BP 114/80   Pulse 77   Temp (!) 97.5 F (36.4 C) (Temporal)   Ht 5' 10"  (1.778 m)   Wt 223 lb (101.2 kg)   SpO2 96%   BMI 32.00 kg/m  Vitals with BMI 07/11/2020 05/28/2020 01/24/2020  Height 5' 10"  5' 10"  5' 10"   Weight 223 lbs 222 lbs 10 oz 210 lbs  BMI 32 25.05 39.76  Systolic 734 193 790  Diastolic 80 80 80  Pulse 77 78 94     Physical Exam Vitals reviewed.  Constitutional:      Appearance: Normal appearance.  HENT:     Head: Normocephalic and atraumatic.  Cardiovascular:     Rate and Rhythm: Normal rate and regular rhythm.  Pulmonary:     Effort: Pulmonary effort is normal.     Breath sounds: Normal breath sounds.  Musculoskeletal:     Cervical back: Neck supple.  Skin:    General: Skin is warm and dry.  Neurological:     Mental Status: He is alert and oriented to person, place, and time.  Psychiatric:        Mood and Affect: Mood normal.        Behavior: Behavior normal.        Thought Content: Thought content normal.        Judgment: Judgment normal.          Assessment and Plan   1. Anxiety   2. Mixed hyperlipidemia   3. Fatigue, unspecified type   4. Vitamin D  deficiency disease   5. Intractable migraine without status migrainosus, unspecified migraine type      Plan: 1.  Had a shared decision-making discussion about treatment options, he has elected to try BuSpar 3 times daily as needed.  He will hold off on seeing psychiatry or  therapist regarding his anxiety.  He will return to office in about 2 months to see Dr. Anastasio Champion to see how the [facility] is working. 2.  We will check CMP and lipid panel today for further evaluation. 3.  He will continue on desiccated thyroid and I will request that Dr. Anastasio Champion change patient's testosterone prescription to topical cream as opposed to IM injection. 4.  He will continue on vitamin D3 supplement and we will check serum level today. 5.  He will continue to follow-up with Dr. Domingo Cocking.   Tests ordered Orders Placed This Encounter  Procedures  . Vitamin D, 25-hydroxy  . Lipid Panel  . CMP with eGFR([facility])  . Testosterone Total,Free,Bio, Males      Meds ordered this encounter  Medications  . busPIRone (BUSPAR) 5 MG tablet    Sig: Take 1 tablet (5 mg total) by mouth 3 (three) times daily.    Dispense:  90 tablet    Refill:  1    Order Specific Question:   Supervising Provider    Answer:   Doree Albee [[id number]]    Patient to follow-up in 2 months or sooner as needed  Ailene Ards, NP

## 2020-07-12 LAB — COMPLETE METABOLIC PANEL WITH GFR
AG Ratio: 1.5 (calc) (ref 1.0–2.5)
ALT: 26 U/L (ref 9–46)
AST: 19 U/L (ref 10–40)
Albumin: 4.8 g/dL (ref 3.6–5.1)
Alkaline phosphatase (APISO): 71 U/L (ref 36–130)
BUN: 10 mg/dL (ref 7–25)
CO2: 29 mmol/L (ref 20–32)
Calcium: 10.3 mg/dL (ref 8.6–10.3)
Chloride: 98 mmol/L (ref 98–110)
Creat: 0.96 mg/dL (ref 0.60–1.35)
GFR, Est African American: 109 mL/min/{1.73_m2} (ref >=60)
GFR, Est Non African American: 94 mL/min/{1.73_m2} (ref >=60)
Globulin: 3.1 g/dL (calc) (ref 1.9–3.7)
Glucose, Bld: 84 mg/dL (ref 65–99)
Potassium: 4.2 mmol/L (ref 3.5–5.3)
Sodium: 138 mmol/L (ref 135–146)
Total Bilirubin: 0.8 mg/dL (ref 0.2–1.2)
Total Protein: 7.9 g/dL (ref 6.1–8.1)

## 2020-07-12 LAB — TESTOSTERONE TOTAL,FREE,BIO, MALES
Albumin: 4.8 g/dL (ref 3.6–5.1)
Sex Hormone Binding: 38 nmol/L (ref 10–50)
Testosterone, Bioavailable: 98.3 ng/dL — ABNORMAL LOW (ref >=110.0)
Testosterone, Free: 45 pg/mL — ABNORMAL LOW (ref 46.0–224.0)
Testosterone: 394 ng/dL (ref 250–827)

## 2020-07-12 LAB — LIPID PANEL
Cholesterol: 219 mg/dL — ABNORMAL HIGH (ref <200)
HDL: 42 mg/dL (ref >=40)
LDL Cholesterol (Calc): 147 mg/dL (calc) — ABNORMAL HIGH
Non-HDL Cholesterol (Calc): 177 mg/dL (calc) — ABNORMAL HIGH (ref <130)
Total CHOL/HDL Ratio: 5.2 (calc) — ABNORMAL HIGH (ref <5.0)
Triglycerides: 162 mg/dL — ABNORMAL HIGH (ref <150)

## 2020-07-12 LAB — VITAMIN D 25 HYDROXY (VIT D DEFICIENCY, FRACTURES): Vit D, 25-Hydroxy: 43 ng/mL (ref 30–100)

## 2020-07-15 DIAGNOSIS — M542 Cervicalgia: Secondary | ICD-10-CM | POA: Diagnosis not present

## 2020-07-15 DIAGNOSIS — G43719 Chronic migraine without aura, intractable, without status migrainosus: Secondary | ICD-10-CM | POA: Diagnosis not present

## 2020-07-15 DIAGNOSIS — M791 Myalgia, unspecified site: Secondary | ICD-10-CM | POA: Diagnosis not present

## 2020-07-15 DIAGNOSIS — G43901 Migraine, unspecified, not intractable, with status migrainosus: Secondary | ICD-10-CM | POA: Diagnosis not present

## 2020-07-16 ENCOUNTER — Other Ambulatory Visit (INDEPENDENT_AMBULATORY_CARE_PROVIDER_SITE_OTHER): Payer: Self-pay | Admitting: Internal Medicine

## 2020-07-16 MED ORDER — TESTOSTERONE 20 % CREA: 100.0000 mg | TOPICAL_CREAM | Freq: Every day | 0 refills | Status: DC

## 2020-07-30 ENCOUNTER — Encounter: Payer: Self-pay | Admitting: Allergy & Immunology

## 2020-07-31 ENCOUNTER — Ambulatory Visit (INDEPENDENT_AMBULATORY_CARE_PROVIDER_SITE_OTHER): Payer: 59 | Admitting: Allergy & Immunology

## 2020-07-31 ENCOUNTER — Other Ambulatory Visit: Payer: Self-pay | Admitting: Allergy & Immunology

## 2020-07-31 ENCOUNTER — Other Ambulatory Visit: Payer: Self-pay

## 2020-07-31 ENCOUNTER — Encounter: Payer: Self-pay | Admitting: Allergy & Immunology

## 2020-07-31 DIAGNOSIS — T7800XD Anaphylactic reaction due to unspecified food, subsequent encounter: Secondary | ICD-10-CM

## 2020-07-31 DIAGNOSIS — J4540 Moderate persistent asthma, uncomplicated: Secondary | ICD-10-CM

## 2020-07-31 DIAGNOSIS — J3089 Other allergic rhinitis: Secondary | ICD-10-CM | POA: Diagnosis not present

## 2020-07-31 DIAGNOSIS — J302 Other seasonal allergic rhinitis: Secondary | ICD-10-CM | POA: Diagnosis not present

## 2020-07-31 MED ORDER — BUDESONIDE-FORMOTEROL FUMARATE 160-4.5 MCG/ACT IN AERO: 2.0000 | INHALATION_SPRAY | Freq: Two times a day (BID) | RESPIRATORY_TRACT | 5 refills | Status: DC

## 2020-07-31 MED ORDER — PREDNISONE 10 MG PO TABS: ORAL_TABLET | ORAL | 0 refills | Status: DC

## 2020-07-31 MED FILL — predniSONE 10 MG TABS: 10 | 9 days supply | Qty: 36 | Fill #0

## 2020-07-31 MED FILL — SYMBICORT 160-4.5 MCG INH: 160-4.5 | 30 days supply | Qty: 10 | Fill #0

## 2020-07-31 NOTE — Progress Notes (Signed)
RE: Sergio [name] MRN: [id number] DOB: 1976/07/11 Date of Telemedicine Visit: 07/31/2020  Referring provider: Doree Albee, MD Primary care provider: Doree Albee, MD  Chief Complaint: No chief complaint on file.   Telemedicine Follow Up Visit via Telephone: I connected with [name] for a follow up on 07/31/20 by telephone and verified that I am speaking with the correct person using two identifiers.   I discussed the limitations, risks, security and privacy concerns of performing an evaluation and management service by telephone and the availability of in person appointments. I also discussed with the patient that there may be a patient responsible charge related to this service. The patient expressed understanding and agreed to proceed.  Patient is at work.  Provider is at the office.  Visit start time: 11:00 AM Visit end time: 11:17 AM Insurance consent/check in by: [facility] Medical consent and medical assistant/nurse: Kayla  History of Present Illness:  He is a 44 y.o. male, who is being followed for persistent asthma as well as P/SAR. His previous allergy office visit was in May 2021 with myself.  At that visit, we did not do lung testing.  We did not make any changes, but did plan on seeing decrease medication allergy shots.  He remains on Singulair as well as Symbicort 80/4.5 mcg 2 puffs twice daily.  For his rhinitis, we recommended starting allergy shots and what is on board.  We continue with Singulair as well as Flonase, Allegra, and Astelin.  He has a history of a food allergy, although skin testing and blood testing was negative.  He had a low risk history so we recommended introducing these at home.  Since last visit, he has mostly done well.  He did contact us in July with complaints of shortness of breath and wheezing.  We sent in prednisone.  He started having issues last week. He has been using an inhaler. He also has severe sinus pressure without fever,  body aches, or chills.   He was working out with his trainer on Thursdays and had a bad asthma attack. It has been really bad since that time. This is the worst time of the year with him.   Asthma/Respiratory Symptom History: He remains on the Symbicort two puffs BID as well as the Singulair. He was on prednisone in July.  He tends to get prednisone around 1-2 times per year.  Sometimes, it will be more.  He has been sleeping well at night.  Allergic Rhinitis Symptom History: He remains on all of his allergy medicines.  He is still interested in starting allergy shots, but he has not had time to make an appointment.  They are currently 12 people down in his department.  He works in a standing department of the hospital.  There are no nursing staff near him.  He is normally fine with coming to the clinic to get his allergy shots.  Otherwise, there have been no changes to his past medical history, surgical history, family history, or social history.  Assessment and Plan:  [name] is a 44 y.o. 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  I asked him to call back in a couple of days if he  was continued to have issues and we could send in an antibiotic. We are temporarily  Going to increase his Symbicort to the higher dose and start his allergy shots. I continue to remain optimistic that we can decrease his medications once we have him on a stronger dose of allergy shots. We are going to get labs out of an abundance of caution in case we need to start a biologic.   Diagnostics: None.  Medication List:  Current Outpatient Medications  Medication Sig Dispense Refill  . azelastine (ASTELIN)  0.1 % nasal spray Place 2 sprays into both nostrils 2 (two) times daily. 30 mL 5  . baclofen (LIORESAL) 10 MG tablet Take 10 mg by mouth 2 (two) times daily. Max of 1-2 treatments weekly    . budesonide-formoterol (SYMBICORT) 80-4.5 MCG/ACT inhaler Inhale 2 puffs into the lungs 2 (two) times daily. 1 Inhaler 5  . busPIRone (BUSPAR) 5 MG tablet Take 1 tablet (5 mg total) by mouth 3 (three) times daily. 90 tablet 1  . Cholecalciferol (VITAMIN D3) 125 MCG (5000 UT) TABS Take by mouth.    . diclofenac (VOLTAREN) 50 MG EC tablet Take 50 mg by mouth 2 (two) times daily. Only for 3 days    . EPINEPHrine 0.3 mg/0.3 mL IJ SOAJ injection Inject 0.3 mLs (0.3 mg total) into the muscle as needed for anaphylaxis. 2 each 1  . Fexofenadine-Pseudoephedrine (ALLEGRA-D 24 HOUR PO) Take by mouth daily.    Marland Kitchen levalbuterol (XOPENEX HFA) 45 MCG/ACT inhaler 4 puffs every 4-6 hours as needed. 15 g 1  . losartan-hydrochlorothiazide (HYZAAR) 100-25 MG tablet TAKE 1 TABLET BY MOUTH DAILY 90 tablet 0  . montelukast (SINGULAIR) 10 MG tablet TAKE 1 TABLET BY MOUTH ONCE A DAY 90 tablet 0  . NP THYROID 90 MG tablet TAKE 1 TABLET BY MOUTH DAILY 30 tablet 3  . pantoprazole (PROTONIX) 40 MG tablet Take 1 tablet (40 mg total) by mouth daily. 90 tablet 0  . simvastatin (ZOCOR) 40 MG tablet Take 1 tablet (40 mg total) by mouth at bedtime. 90 tablet 3  . SUMAtriptan (IMITREX) 50 MG tablet Take 1 tablet (50 mg total) by mouth every 2 (two) hours as needed for migraine. May repeat in 2 hours if headache persists or recurs. 10 tablet 5  . Testosterone 20 % CREA Apply 100 mg topically daily. 100 g 0  . testosterone cypionate (DEPOTESTOSTERONE CYPIONATE) 200 MG/ML injection Inject 100 mg into the muscle once a week.     . zonisamide (ZONEGRAN) 25 MG capsule Take 100 mg by mouth daily.     No current facility-administered medications for this visit.   Allergies: Allergies  Allergen Reactions  . Other Hives    Tree nuts    I reviewed  his past medical history, social history, family history, and environmental history and no significant changes have been reported from previous visits.  Review of Systems  Constitutional: Negative.  Negative for chills, diaphoresis and fever.  HENT: Negative.  Negative for congestion, ear discharge, ear pain and rhinorrhea.   Eyes: Negative for pain, discharge and redness.  Respiratory: Positive for shortness of breath and wheezing. Negative for cough.   Cardiovascular: Negative.  Negative for chest pain and palpitations.  Gastrointestinal: Negative for abdominal pain.  Skin: Negative.  Negative for rash.  Allergic/Immunologic: Negative for environmental allergies.  Neurological: Negative for dizziness and headaches.  Hematological: Does not bruise/bleed easily.    Objective:  Physical exam not obtained as encounter was done  via telephone.   Previous notes and tests were reviewed.  I discussed the assessment and treatment plan with the patient. The patient was provided an opportunity to ask questions and all were answered. The patient agreed with the plan and demonstrated an understanding of the instructions.   The patient was advised to call back or seek an in-person evaluation if the symptoms worsen or if the condition fails to improve as anticipated.  I provided 17 minutes of non-face-to-face time during this encounter.  It was my pleasure to participate in [facility] care today. Please feel free to contact me with any questions or concerns.   Sincerely,  Valentina Shaggy, MD

## 2020-08-01 MED FILL — NP THYROID 90 MG TABLET: 90 | 30 days supply | Qty: 30 | Fill #2

## 2020-08-02 ENCOUNTER — Encounter: Payer: Self-pay | Admitting: Allergy & Immunology

## 2020-08-05 DIAGNOSIS — M542 Cervicalgia: Secondary | ICD-10-CM | POA: Diagnosis not present

## 2020-08-05 DIAGNOSIS — M791 Myalgia, unspecified site: Secondary | ICD-10-CM | POA: Diagnosis not present

## 2020-08-05 DIAGNOSIS — G43719 Chronic migraine without aura, intractable, without status migrainosus: Secondary | ICD-10-CM | POA: Diagnosis not present

## 2020-08-05 MED FILL — PANTOPRAZOLE SOD DR 40 MG T: 40 | 30 days supply | Qty: 30 | Fill #2

## 2020-08-12 ENCOUNTER — Other Ambulatory Visit (INDEPENDENT_AMBULATORY_CARE_PROVIDER_SITE_OTHER): Payer: Self-pay

## 2020-08-12 ENCOUNTER — Other Ambulatory Visit (INDEPENDENT_AMBULATORY_CARE_PROVIDER_SITE_OTHER): Payer: Self-pay | Admitting: Internal Medicine

## 2020-08-12 DIAGNOSIS — J4520 Mild intermittent asthma, uncomplicated: Secondary | ICD-10-CM

## 2020-08-12 DIAGNOSIS — K449 Diaphragmatic hernia without obstruction or gangrene: Secondary | ICD-10-CM

## 2020-08-12 DIAGNOSIS — K219 Gastro-esophageal reflux disease without esophagitis: Secondary | ICD-10-CM

## 2020-08-12 MED ORDER — MONTELUKAST SODIUM 10 MG PO TABS: 10.0000 mg | ORAL_TABLET | Freq: Every day | ORAL | 0 refills | Status: DC

## 2020-08-12 MED ORDER — PANTOPRAZOLE SODIUM 40 MG PO TBEC: 40.0000 mg | DELAYED_RELEASE_TABLET | Freq: Every day | ORAL | 0 refills | Status: DC

## 2020-08-12 MED ORDER — LOSARTAN POTASSIUM-HCTZ 100-25 MG PO TABS
1.0000 | ORAL_TABLET | Freq: Every day | ORAL | 0 refills | Status: DC
Start: 2020-08-12 — End: 2020-08-12

## 2020-08-12 MED FILL — LOSARTAN-HCTZ 100-25 MG TAB: 100-25 | 90 days supply | Qty: 90 | Fill #0

## 2020-08-12 MED FILL — MONTELUKAST SOD 10 MG TAB: 10 | 90 days supply | Qty: 90 | Fill #0

## 2020-08-15 ENCOUNTER — Ambulatory Visit: Payer: 59

## 2020-08-20 DIAGNOSIS — M791 Myalgia, unspecified site: Secondary | ICD-10-CM | POA: Diagnosis not present

## 2020-08-20 DIAGNOSIS — G43719 Chronic migraine without aura, intractable, without status migrainosus: Secondary | ICD-10-CM | POA: Diagnosis not present

## 2020-08-20 DIAGNOSIS — M542 Cervicalgia: Secondary | ICD-10-CM | POA: Diagnosis not present

## 2020-08-20 DIAGNOSIS — G43901 Migraine, unspecified, not intractable, with status migrainosus: Secondary | ICD-10-CM | POA: Diagnosis not present

## 2020-08-22 ENCOUNTER — Ambulatory Visit (INDEPENDENT_AMBULATORY_CARE_PROVIDER_SITE_OTHER): Payer: 59

## 2020-08-22 ENCOUNTER — Other Ambulatory Visit: Payer: Self-pay

## 2020-08-22 DIAGNOSIS — J309 Allergic rhinitis, unspecified: Secondary | ICD-10-CM

## 2020-08-22 NOTE — Progress Notes (Signed)
Immunotherapy   Patient Details  Name: YUVAN MEDINGER MRN: [id number] Date of Birth: December 14, 1975  08/22/2020  [name] started injections for  RW-MOLDS & G-W-T-C Following schedule: A  Frequency:1 time per week Epi-Pen:Epi-Pen available Patient waited 30 minutes in office post injection with no local or systemic symptoms.  Consent signed and patient instructions given.   Rosalio Loud 08/22/2020, 4:43 PM

## 2020-08-26 MED FILL — busPIRone HCL 5 MG TABS: 5 | 30 days supply | Qty: 90 | Fill #1

## 2020-08-26 MED FILL — ZONISAMIDE 25 MG CAPSULE: 25 | 30 days supply | Qty: 120 | Fill #1

## 2020-08-29 ENCOUNTER — Ambulatory Visit (INDEPENDENT_AMBULATORY_CARE_PROVIDER_SITE_OTHER): Payer: 59

## 2020-08-29 DIAGNOSIS — J309 Allergic rhinitis, unspecified: Secondary | ICD-10-CM | POA: Diagnosis not present

## 2020-09-02 MED FILL — SIMVASTATIN 40 MG TABLET: 40 | 90 days supply | Qty: 90 | Fill #1

## 2020-09-02 MED FILL — NP THYROID 90 MG TABLET: 90 | 30 days supply | Qty: 30 | Fill #3

## 2020-09-03 ENCOUNTER — Ambulatory Visit (INDEPENDENT_AMBULATORY_CARE_PROVIDER_SITE_OTHER): Payer: 59 | Admitting: *Deleted

## 2020-09-03 DIAGNOSIS — J309 Allergic rhinitis, unspecified: Secondary | ICD-10-CM | POA: Diagnosis not present

## 2020-09-09 MED FILL — PANTOPRAZOLE SOD DR 40 MG T: 40 | 90 days supply | Qty: 90 | Fill #0

## 2020-09-11 ENCOUNTER — Ambulatory Visit (INDEPENDENT_AMBULATORY_CARE_PROVIDER_SITE_OTHER): Payer: 59

## 2020-09-11 DIAGNOSIS — J309 Allergic rhinitis, unspecified: Secondary | ICD-10-CM | POA: Diagnosis not present

## 2020-09-19 ENCOUNTER — Ambulatory Visit (INDEPENDENT_AMBULATORY_CARE_PROVIDER_SITE_OTHER): Payer: 59

## 2020-09-19 DIAGNOSIS — J309 Allergic rhinitis, unspecified: Secondary | ICD-10-CM

## 2020-09-19 DIAGNOSIS — M542 Cervicalgia: Secondary | ICD-10-CM | POA: Diagnosis not present

## 2020-09-19 DIAGNOSIS — G43719 Chronic migraine without aura, intractable, without status migrainosus: Secondary | ICD-10-CM | POA: Diagnosis not present

## 2020-09-19 DIAGNOSIS — M791 Myalgia, unspecified site: Secondary | ICD-10-CM | POA: Diagnosis not present

## 2020-09-19 DIAGNOSIS — G43901 Migraine, unspecified, not intractable, with status migrainosus: Secondary | ICD-10-CM | POA: Diagnosis not present

## 2020-09-27 ENCOUNTER — Ambulatory Visit (INDEPENDENT_AMBULATORY_CARE_PROVIDER_SITE_OTHER): Payer: 59 | Admitting: *Deleted

## 2020-09-27 DIAGNOSIS — J309 Allergic rhinitis, unspecified: Secondary | ICD-10-CM

## 2020-10-02 ENCOUNTER — Ambulatory Visit (INDEPENDENT_AMBULATORY_CARE_PROVIDER_SITE_OTHER): Payer: 59 | Admitting: Nurse Practitioner

## 2020-10-02 ENCOUNTER — Other Ambulatory Visit: Payer: Self-pay | Admitting: Allergy & Immunology

## 2020-10-02 DIAGNOSIS — M791 Myalgia, unspecified site: Secondary | ICD-10-CM | POA: Diagnosis not present

## 2020-10-02 DIAGNOSIS — G43719 Chronic migraine without aura, intractable, without status migrainosus: Secondary | ICD-10-CM | POA: Diagnosis not present

## 2020-10-02 DIAGNOSIS — M542 Cervicalgia: Secondary | ICD-10-CM | POA: Diagnosis not present

## 2020-10-02 DIAGNOSIS — G43901 Migraine, unspecified, not intractable, with status migrainosus: Secondary | ICD-10-CM | POA: Diagnosis not present

## 2020-10-02 MED FILL — AZELASTINE HCL 137 MCG/SPRA: 137 | 30 days supply | Qty: 30 | Fill #0

## 2020-10-03 ENCOUNTER — Ambulatory Visit (INDEPENDENT_AMBULATORY_CARE_PROVIDER_SITE_OTHER): Payer: 59

## 2020-10-03 DIAGNOSIS — J309 Allergic rhinitis, unspecified: Secondary | ICD-10-CM

## 2020-10-07 ENCOUNTER — Other Ambulatory Visit (INDEPENDENT_AMBULATORY_CARE_PROVIDER_SITE_OTHER): Payer: Self-pay | Admitting: Internal Medicine

## 2020-10-07 ENCOUNTER — Telehealth (INDEPENDENT_AMBULATORY_CARE_PROVIDER_SITE_OTHER): Payer: Self-pay

## 2020-10-07 DIAGNOSIS — F419 Anxiety disorder, unspecified: Secondary | ICD-10-CM

## 2020-10-07 DIAGNOSIS — E782 Mixed hyperlipidemia: Secondary | ICD-10-CM

## 2020-10-07 DIAGNOSIS — R5383 Other fatigue: Secondary | ICD-10-CM

## 2020-10-07 DIAGNOSIS — E559 Vitamin D deficiency, unspecified: Secondary | ICD-10-CM

## 2020-10-07 MED ORDER — BUSPIRONE HCL 5 MG PO TABS: 5.0000 mg | ORAL_TABLET | Freq: Three times a day (TID) | ORAL | 0 refills | Status: DC

## 2020-10-07 MED FILL — ZONISAMIDE 25 MG CAPSULE: 25 | 30 days supply | Qty: 120 | Fill #0

## 2020-10-07 MED FILL — busPIRone HCL 5 MG TABS: 5 | 90 days supply | Qty: 90 | Fill #0

## 2020-10-07 MED FILL — NP THYROID 90 MG TABLET: 90 | 30 days supply | Qty: 30 | Fill #0

## 2020-10-07 NOTE — Telephone Encounter (Signed)
Received a refill request from [facility] for the following medications:  busPIRone (BUSPAR) 5 MG tablet  Last filled 07/11/2020, # 90 with 1 refill  Looks like NP Thyroid 90 mg has been filled today.

## 2020-10-10 ENCOUNTER — Ambulatory Visit (INDEPENDENT_AMBULATORY_CARE_PROVIDER_SITE_OTHER): Payer: 59 | Admitting: *Deleted

## 2020-10-10 DIAGNOSIS — J309 Allergic rhinitis, unspecified: Secondary | ICD-10-CM

## 2020-10-15 ENCOUNTER — Encounter: Payer: Self-pay | Admitting: Neurology

## 2020-10-15 ENCOUNTER — Ambulatory Visit: Payer: 59 | Admitting: Neurology

## 2020-10-15 NOTE — Progress Notes (Deleted)
PATIENT: Sergio [name] DOB: 06/01/1976  REASON FOR VISIT: follow up HISTORY FROM: patient  HISTORY OF PRESENT ILLNESS: Today 10/15/20  HISTORY  Sergio [name] is a 45 year old male, seen in request by his primary care doctor Hurshel Party for evaluation of left-sided headache initial evaluation was on October 19, 2018.  I have reviewed and summarized the referring note from the referring physician.  He had past medical history of obstructive sleep apnea using CPAP, GERD, hyperlipidemia,  Since 2010, he began to notice intermittent left side headache, always starting from sharp pains at left parietal region, intermittent pounding pain can last for 4 to 5 days, it happened every 2 to 3 months, movement made it worse, he denies significant light noise sensitivity, no nausea, he has tried over-the-counter Tylenol NSAIDs without significant help now he just to suffer through.  He was not able to identify any triggers, there is no lateralized motor or sensory deficit.  Only has mild tearing on the left eye, no other significant autonomic phenomenon.  Update October 15, 2020 SS:   REVIEW OF SYSTEMS: Out of a complete 14 system review of symptoms, the patient complains only of the following symptoms, and all other reviewed systems are negative.  ALLERGIES: Allergies  Allergen Reactions  . Other Hives    Tree nuts     HOME MEDICATIONS: Outpatient Medications Prior to Visit  Medication Sig Dispense Refill  . Azelastine HCl 137 MCG/SPRAY SOLN INSTILL 2 SPRAYS INTO BOTH NOSTRILS TWICE DAILY 30 mL 5  . baclofen (LIORESAL) 10 MG tablet Take 10 mg by mouth 2 (two) times daily. Max of 1-2 treatments weekly    . budesonide-formoterol (SYMBICORT) 160-4.5 MCG/ACT inhaler Inhale 2 puffs into the lungs 2 (two) times daily. 1 each 5  . busPIRone (BUSPAR) 5 MG tablet Take 1 tablet (5 mg total) by mouth 3 (three) times daily. 90 tablet 0  . Cholecalciferol (VITAMIN D3) 125 MCG (5000 UT) TABS  Take by mouth.    . diclofenac (VOLTAREN) 50 MG EC tablet Take 50 mg by mouth 2 (two) times daily. Only for 3 days    . EPINEPHrine 0.3 mg/0.3 mL IJ SOAJ injection Inject 0.3 mLs (0.3 mg total) into the muscle as needed for anaphylaxis. 2 each 1  . Fexofenadine-Pseudoephedrine (ALLEGRA-D 24 HOUR PO) Take by mouth daily.    Marland Kitchen levalbuterol (XOPENEX HFA) 45 MCG/ACT inhaler 4 puffs every 4-6 hours as needed. 15 g 1  . losartan-hydrochlorothiazide (HYZAAR) 100-25 MG tablet Take 1 tablet by mouth daily. 90 tablet 0  . montelukast (SINGULAIR) 10 MG tablet Take 1 tablet (10 mg total) by mouth daily. 90 tablet 0  . NP THYROID 90 MG tablet TAKE 1 TABLET BY MOUTH DAILY 30 tablet 0  . pantoprazole (PROTONIX) 40 MG tablet Take 1 tablet (40 mg total) by mouth daily. 90 tablet 0  . predniSONE (DELTASONE) 10 MG tablet Take 3 tabs (30mg ) twice daily for 3 days, then 2 tabs (20mg ) twice daily for 3 days, then 1 tab (10mg ) twice daily for 3 days, then STOP. 36 tablet 0  . simvastatin (ZOCOR) 40 MG tablet Take 1 tablet (40 mg total) by mouth at bedtime. 90 tablet 3  . Testosterone 20 % CREA Apply 100 mg topically daily. 100 g 0  . zonisamide (ZONEGRAN) 25 MG capsule Take 100 mg by mouth daily.     No facility-administered medications prior to visit.    PAST MEDICAL HISTORY: Past Medical History:  Diagnosis Date  .  Asthma with allergic rhinitis 03/11/2008  . Essential hypertension 05/06/2012  . Gastroesophageal reflux disease with hiatal hernia 05/06/2012  . Gastroesophageal reflux disease with hiatal hernia   . Hyperlipidemia LDL goal < 160 03/11/2008  . Irritable bowel syndrome with constipation and diarrhea 05/06/2012  . Migraine   . Obesity (BMI 30.0-34.9) 03/03/2013  . Obstructive sleep apnea 11/19/2008   Nocturnal polysomnography 12/14/2008: AHI 20.8, O2 Sat nadir 84%, optimal CPAP 12 cm H2O   . Urticaria     PAST SURGICAL HISTORY: Past Surgical History:  Procedure Laterality Date  . URETHRAL DILATION       FAMILY HISTORY: Family History  Problem Relation Age of Onset  . Early death Father        Accident  . Hypertension Maternal Grandmother   . Vaginal cancer Maternal Grandmother   . Parkinson's disease Maternal Grandmother   . Diabetes Paternal Grandfather   . ALS Paternal Grandfather   . Breast cancer Paternal Grandmother   . Kidney cancer Paternal Grandmother   . Healthy Mother   . Healthy Sister   . Healthy Sister   . Hypertension Maternal Uncle   . Diabetes Paternal Aunt   . COPD Maternal Grandfather   . Urticaria Neg Hx   . Immunodeficiency Neg Hx   . Eczema Neg Hx   . Atopy Neg Hx   . Angioedema Neg Hx   . Allergic rhinitis Neg Hx   . Asthma Neg Hx     SOCIAL HISTORY: Social History   Socioeconomic History  . Marital status: Married    Spouse name: Not on file  . Number of children: 0  . Years of education: college  . Highest education level: Not on file  Occupational History  . Occupation: Medical Records  Tobacco Use  . Smoking status: Never Smoker  . Smokeless tobacco: Never Used  Vaping Use  . Vaping Use: Never used  Substance and Sexual Activity  . Alcohol use: Yes    Comment: occ  . Drug use: No  . Sexual activity: Yes    Birth control/protection: None  Other Topics Concern  . Not on file  Social History Narrative   Works for [employer] in the [employer].  Married for 23 years, no children.   Right-handed.   Occasionally caffeine use.   Social Determinants of Health   Financial Resource Strain: Not on file  Food Insecurity: Not on file  Transportation Needs: Not on file  Physical Activity: Not on file  Stress: Not on file  Social Connections: Not on file  Intimate Partner Violence: Not on file      PHYSICAL EXAM  There were no vitals filed for this visit. There is no height or weight on file to calculate BMI.  Generalized: Well developed, in no acute distress   Neurological examination  Mentation: Alert oriented to  time, place, history taking. Follows all commands speech and language fluent Cranial nerve II-XII: Pupils were equal round reactive to light. Extraocular movements were full, visual field were full on confrontational test. Facial sensation and strength were normal. Uvula tongue midline. Head turning and shoulder shrug  were normal and symmetric. Motor: The motor testing reveals 5 over 5 strength of all 4 extremities. Good symmetric motor tone is noted throughout.  Sensory: Sensory testing is intact to soft touch on all 4 extremities. No evidence of extinction is noted.  Coordination: Cerebellar testing reveals good finger-nose-finger and heel-to-shin bilaterally.  Gait and station: Gait is normal. Tandem gait  is normal. Romberg is negative. No drift is seen.  Reflexes: Deep tendon reflexes are symmetric and normal bilaterally.   DIAGNOSTIC DATA (LABS, IMAGING, TESTING) - I reviewed patient records, labs, notes, testing and imaging myself where available.  Lab Results  Component Value Date   WBC 8.0 11/13/2019   HGB 16.9 11/13/2019   HCT 49.5 11/13/2019   MCV 85.1 11/13/2019   PLT 315 11/13/2019      Component Value Date/Time   NA 138 07/11/2020 1708   NA 140 05/13/2015 1428   K 4.2 07/11/2020 1708   CL 98 07/11/2020 1708   CO2 29 07/11/2020 1708   GLUCOSE 84 07/11/2020 1708   BUN 10 07/11/2020 1708   BUN 10 05/13/2015 1428   CREATININE 0.96 07/11/2020 1708   CALCIUM 10.3 07/11/2020 1708   PROT 7.9 07/11/2020 1708   PROT 7.4 05/13/2015 1428   ALBUMIN 4.5 05/13/2015 1428   AST 19 07/11/2020 1708   ALT 26 07/11/2020 1708   ALKPHOS 82 05/13/2015 1428   BILITOT 0.8 07/11/2020 1708   BILITOT 0.4 05/13/2015 1428   GFRNONAA 94 07/11/2020 1708   GFRAA 109 07/11/2020 1708   Lab Results  Component Value Date   CHOL 219 (H) 07/11/2020   HDL 42 07/11/2020   LDLCALC 147 (H) 07/11/2020   TRIG 162 (H) 07/11/2020   CHOLHDL 5.2 (H) 07/11/2020   Lab Results  Component Value Date    HGBA1C 5.5 11/13/2019   No results found for: POLIDCVU13 Lab Results  Component Value Date   TSH 0.61 05/28/2020      ASSESSMENT AND PLAN 45 y.o. year old male  has a past medical history of Asthma with allergic rhinitis (03/11/2008), Essential hypertension (05/06/2012), Gastroesophageal reflux disease with hiatal hernia (05/06/2012), Gastroesophageal reflux disease with hiatal hernia, Hyperlipidemia LDL goal < 160 (03/11/2008), Irritable bowel syndrome with constipation and diarrhea (05/06/2012), Migraine, Obesity (BMI 30.0-34.9) (03/03/2013), Obstructive sleep apnea (11/19/2008), and Urticaria. here with:  1.  Recurrent left-sided headaches -With migraine features   I spent 15 minutes with the patient. 50% of this time was spent   Butler Denmark, Fisher, DNP 10/15/2020, 5:34 AM [facility] [address], [place], [place] [address] [phone]

## 2020-10-16 ENCOUNTER — Ambulatory Visit (INDEPENDENT_AMBULATORY_CARE_PROVIDER_SITE_OTHER): Payer: 59

## 2020-10-16 DIAGNOSIS — J309 Allergic rhinitis, unspecified: Secondary | ICD-10-CM | POA: Diagnosis not present

## 2020-10-17 ENCOUNTER — Other Ambulatory Visit (HOSPITAL_COMMUNITY): Payer: Self-pay | Admitting: Specialist

## 2020-10-17 DIAGNOSIS — G43901 Migraine, unspecified, not intractable, with status migrainosus: Secondary | ICD-10-CM | POA: Diagnosis not present

## 2020-10-17 DIAGNOSIS — G43719 Chronic migraine without aura, intractable, without status migrainosus: Secondary | ICD-10-CM | POA: Diagnosis not present

## 2020-10-17 DIAGNOSIS — M791 Myalgia, unspecified site: Secondary | ICD-10-CM | POA: Diagnosis not present

## 2020-10-17 DIAGNOSIS — M542 Cervicalgia: Secondary | ICD-10-CM | POA: Diagnosis not present

## 2020-10-17 MED FILL — BACLOFEN 10 MG TABS: 10 | 35 days supply | Qty: 20 | Fill #0

## 2020-10-17 MED FILL — ZONISAMIDE 100 MG CAPSULE: 100 | 30 days supply | Qty: 30 | Fill #0

## 2020-10-24 ENCOUNTER — Ambulatory Visit (INDEPENDENT_AMBULATORY_CARE_PROVIDER_SITE_OTHER): Payer: 59

## 2020-10-24 DIAGNOSIS — J309 Allergic rhinitis, unspecified: Secondary | ICD-10-CM | POA: Diagnosis not present

## 2020-10-30 ENCOUNTER — Ambulatory Visit (INDEPENDENT_AMBULATORY_CARE_PROVIDER_SITE_OTHER): Payer: 59 | Admitting: *Deleted

## 2020-10-30 DIAGNOSIS — J309 Allergic rhinitis, unspecified: Secondary | ICD-10-CM | POA: Diagnosis not present

## 2020-10-31 ENCOUNTER — Ambulatory Visit (INDEPENDENT_AMBULATORY_CARE_PROVIDER_SITE_OTHER): Payer: 59 | Admitting: Nurse Practitioner

## 2020-10-31 ENCOUNTER — Other Ambulatory Visit: Payer: Self-pay

## 2020-10-31 ENCOUNTER — Encounter (INDEPENDENT_AMBULATORY_CARE_PROVIDER_SITE_OTHER): Payer: Self-pay | Admitting: Nurse Practitioner

## 2020-10-31 VITALS — BP 115/78 | HR 86 | Temp 96.0°F | Ht 70.0 in | Wt 223.2 lb

## 2020-10-31 DIAGNOSIS — E559 Vitamin D deficiency, unspecified: Secondary | ICD-10-CM | POA: Diagnosis not present

## 2020-10-31 DIAGNOSIS — F419 Anxiety disorder, unspecified: Secondary | ICD-10-CM

## 2020-10-31 DIAGNOSIS — I10 Essential (primary) hypertension: Secondary | ICD-10-CM

## 2020-10-31 DIAGNOSIS — E782 Mixed hyperlipidemia: Secondary | ICD-10-CM | POA: Diagnosis not present

## 2020-10-31 NOTE — Progress Notes (Signed)
Subjective:  Patient ID: Sergio [name], male    DOB: 1976/08/13  Age: 45 y.o. MRN: [id number]  CC:  Chief Complaint  Patient presents with  . Follow-up    Doing good, no issues at this time.  Marland Kitchen Anxiety  . Hyperlipidemia  . Hypertension  . Other    Vitamin D deficiency      HPI  This patient arrives today for the above.  Anxiety: At last office visit per shared decision making we decided to trial BuSpar 3 times a day as needed for irritability and anxiety.  He tells me since starting this medication feels that his mood is much improved and he does only take this as needed.  He feels happy with the current medication does not feel he needs anything in addition or a different agent.  Hyperlipidemia: Last lipid panel shows LDL of 147 which is an improvement from 178.  He continues on simvastatin and is tolerating this well.  Hypertension: He continues on losartan hydrochlorothiazide, he is tolerating his medications well.  Vitamin D deficiency: Last serum check was collected in October and it was 71.  He continues on his vitamin D3 supplement.  Past Medical History:  Diagnosis Date  . Asthma with allergic rhinitis 03/11/2008  . Essential hypertension 05/06/2012  . Gastroesophageal reflux disease with hiatal hernia 05/06/2012  . Gastroesophageal reflux disease with hiatal hernia   . Hyperlipidemia LDL goal < 160 03/11/2008  . Irritable bowel syndrome with constipation and diarrhea 05/06/2012  . Migraine   . Obesity (BMI 30.0-34.9) 03/03/2013  . Obstructive sleep apnea 11/19/2008   Nocturnal polysomnography 12/14/2008: AHI 20.8, O2 Sat nadir 84%, optimal CPAP 12 cm H2O   . Urticaria       Family History  Problem Relation Age of Onset  . Early death Father        Accident  . Hypertension Maternal Grandmother   . Vaginal cancer Maternal Grandmother   . Parkinson's disease Maternal Grandmother   . Diabetes Paternal Grandfather   . ALS Paternal Grandfather   . Breast cancer  Paternal Grandmother   . Kidney cancer Paternal Grandmother   . Healthy Mother   . Healthy Sister   . Healthy Sister   . Hypertension Maternal Uncle   . Diabetes Paternal Aunt   . COPD Maternal Grandfather   . Urticaria Neg Hx   . Immunodeficiency Neg Hx   . Eczema Neg Hx   . Atopy Neg Hx   . Angioedema Neg Hx   . Allergic rhinitis Neg Hx   . Asthma Neg Hx     Social History   Social History Narrative   Works for [employer] in the [employer].  Married for 23 years, no children.   Right-handed.   Occasionally caffeine use.   Social History   Tobacco Use  . Smoking status: Never Smoker  . Smokeless tobacco: Never Used  Substance Use Topics  . Alcohol use: Yes    Comment: occ     Current Meds  Medication Sig  . Azelastine HCl 137 MCG/SPRAY SOLN INSTILL 2 SPRAYS INTO BOTH NOSTRILS TWICE DAILY  . baclofen (LIORESAL) 10 MG tablet Take 10 mg by mouth 2 (two) times daily. Max of 1-2 treatments weekly  . budesonide-formoterol (SYMBICORT) 160-4.5 MCG/ACT inhaler Inhale 2 puffs into the lungs 2 (two) times daily.  . busPIRone (BUSPAR) 5 MG tablet Take 1 tablet (5 mg total) by mouth 3 (three) times daily.  . Cholecalciferol (VITAMIN  D3) 125 MCG (5000 UT) TABS Take by mouth.  . diclofenac (VOLTAREN) 50 MG EC tablet Take 50 mg by mouth 2 (two) times daily. Only for 3 days  . EPINEPHrine 0.3 mg/0.3 mL IJ SOAJ injection Inject 0.3 mLs (0.3 mg total) into the muscle as needed for anaphylaxis.  Marland Kitchen Fexofenadine-Pseudoephedrine (ALLEGRA-D 24 HOUR PO) Take by mouth daily.  Marland Kitchen levalbuterol (XOPENEX HFA) 45 MCG/ACT inhaler 4 puffs every 4-6 hours as needed.  Marland Kitchen losartan-hydrochlorothiazide (HYZAAR) 100-25 MG tablet Take 1 tablet by mouth daily.  . montelukast (SINGULAIR) 10 MG tablet Take 1 tablet (10 mg total) by mouth daily.  . NP THYROID 90 MG tablet TAKE 1 TABLET BY MOUTH DAILY  . pantoprazole (PROTONIX) 40 MG tablet Take 1 tablet (40 mg total) by mouth daily.  . simvastatin (ZOCOR)  40 MG tablet Take 1 tablet (40 mg total) by mouth at bedtime.  . Testosterone 20 % CREA Apply 100 mg topically daily.  Marland Kitchen zonisamide (ZONEGRAN) 25 MG capsule Take 100 mg by mouth daily.    ROS:  Review of Systems  Eyes: Negative for blurred vision.  Respiratory: Negative for shortness of breath.   Cardiovascular: Negative for chest pain.  Psychiatric/Behavioral: The patient is not nervous/anxious.      Objective:   Today's Vitals: BP 115/78   Pulse 86   Temp (!) 96 F (35.6 C)   Ht 5\' 10"  (1.778 m)   Wt 223 lb 4 oz (101.3 kg)   SpO2 97%   BMI 32.03 kg/m  Vitals with BMI 10/31/2020 07/11/2020 05/28/2020  Height 5\' 10"  5\' 10"  5\' 10"   Weight 223 lbs 4 oz 223 lbs 222 lbs 10 oz  BMI 32.12 32 24.82  Systolic 500 370 488  Diastolic 78 80 80  Pulse 86 77 78     Physical Exam Vitals reviewed.  Constitutional:      Appearance: Normal appearance.  HENT:     Head: Normocephalic and atraumatic.  Cardiovascular:     Rate and Rhythm: Normal rate and regular rhythm.  Pulmonary:     Effort: Pulmonary effort is normal.     Breath sounds: Normal breath sounds.  Musculoskeletal:     Cervical back: Neck supple.  Skin:    General: Skin is warm and dry.  Neurological:     Mental Status: He is alert and oriented to person, place, and time.  Psychiatric:        Mood and Affect: Mood normal.        Behavior: Behavior normal.        Thought Content: Thought content normal.        Judgment: Judgment normal.          Assessment and Plan   1. Anxiety   2. Mixed hyperlipidemia   3. Vitamin D deficiency disease   4. Essential hypertension      Plan: 1.  He will continue on BuSpar as needed. 2.  He will continue on simvastatin. 3.  He will continue on his vitamin D3 supplement. 4.  He will continue on his losartan hydrochlorthiazide.   Tests ordered No orders of the defined types were placed in this encounter.     No orders of the defined types were placed in this  encounter.   Patient to follow-up in 3 months for annual physical exam or sooner as needed.  Ailene Ards, NP

## 2020-11-07 ENCOUNTER — Other Ambulatory Visit (HOSPITAL_COMMUNITY): Payer: Self-pay | Admitting: Physician Assistant

## 2020-11-07 DIAGNOSIS — M79671 Pain in right foot: Secondary | ICD-10-CM | POA: Diagnosis not present

## 2020-11-07 MED FILL — predniSONE 10 MG (21) TBPK: 10 | 6 days supply | Qty: 21 | Fill #0

## 2020-11-11 ENCOUNTER — Other Ambulatory Visit (INDEPENDENT_AMBULATORY_CARE_PROVIDER_SITE_OTHER): Payer: Self-pay | Admitting: Internal Medicine

## 2020-11-11 DIAGNOSIS — J4520 Mild intermittent asthma, uncomplicated: Secondary | ICD-10-CM

## 2020-11-11 MED FILL — ZONISAMIDE 100 MG CAPSULE: 100 | 30 days supply | Qty: 30 | Fill #0

## 2020-11-11 MED FILL — MONTELUKAST SOD 10 MG TAB: 10 | 90 days supply | Qty: 90 | Fill #0

## 2020-11-11 MED FILL — LOSARTAN-HCTZ 100-25 MG TAB: 100-25 | 30 days supply | Qty: 30 | Fill #0

## 2020-11-11 MED FILL — NP THYROID 90 MG TABLET: 90 | 30 days supply | Qty: 30 | Fill #0

## 2020-11-14 ENCOUNTER — Ambulatory Visit (INDEPENDENT_AMBULATORY_CARE_PROVIDER_SITE_OTHER): Payer: 59

## 2020-11-14 DIAGNOSIS — J309 Allergic rhinitis, unspecified: Secondary | ICD-10-CM

## 2020-11-21 ENCOUNTER — Ambulatory Visit (INDEPENDENT_AMBULATORY_CARE_PROVIDER_SITE_OTHER): Payer: 59

## 2020-11-21 DIAGNOSIS — J309 Allergic rhinitis, unspecified: Secondary | ICD-10-CM

## 2020-12-09 ENCOUNTER — Other Ambulatory Visit (INDEPENDENT_AMBULATORY_CARE_PROVIDER_SITE_OTHER): Payer: Self-pay | Admitting: Internal Medicine

## 2020-12-09 ENCOUNTER — Telehealth (INDEPENDENT_AMBULATORY_CARE_PROVIDER_SITE_OTHER): Payer: Self-pay

## 2020-12-09 DIAGNOSIS — R5383 Other fatigue: Secondary | ICD-10-CM

## 2020-12-09 DIAGNOSIS — E782 Mixed hyperlipidemia: Secondary | ICD-10-CM

## 2020-12-09 DIAGNOSIS — M722 Plantar fascial fibromatosis: Secondary | ICD-10-CM | POA: Diagnosis not present

## 2020-12-09 DIAGNOSIS — M79671 Pain in right foot: Secondary | ICD-10-CM | POA: Diagnosis not present

## 2020-12-09 DIAGNOSIS — K449 Diaphragmatic hernia without obstruction or gangrene: Secondary | ICD-10-CM

## 2020-12-09 DIAGNOSIS — F419 Anxiety disorder, unspecified: Secondary | ICD-10-CM

## 2020-12-09 DIAGNOSIS — K219 Gastro-esophageal reflux disease without esophagitis: Secondary | ICD-10-CM

## 2020-12-09 DIAGNOSIS — E559 Vitamin D deficiency, unspecified: Secondary | ICD-10-CM

## 2020-12-09 MED ORDER — PANTOPRAZOLE SODIUM 40 MG PO TBEC: 40.0000 mg | DELAYED_RELEASE_TABLET | Freq: Every day | ORAL | 0 refills | Status: DC

## 2020-12-09 MED FILL — PANTOPRAZOLE SOD DR 40 MG T: 40 | 90 days supply | Qty: 90 | Fill #0

## 2020-12-09 MED FILL — LOSARTAN POTASSIUM-HCTZ 100: 100-25 | 30 days supply | Qty: 30 | Fill #1

## 2020-12-09 NOTE — Telephone Encounter (Signed)
Received a fax from [facility] for a refill request of the following:  pantoprazole (PROTONIX) 40 MG tablet  Last filled 08/12/2020, # 90 with 0 refills

## 2020-12-11 ENCOUNTER — Other Ambulatory Visit (HOSPITAL_COMMUNITY): Payer: Self-pay | Admitting: Specialist

## 2020-12-11 DIAGNOSIS — G43719 Chronic migraine without aura, intractable, without status migrainosus: Secondary | ICD-10-CM | POA: Diagnosis not present

## 2020-12-11 DIAGNOSIS — G43901 Migraine, unspecified, not intractable, with status migrainosus: Secondary | ICD-10-CM | POA: Diagnosis not present

## 2020-12-11 DIAGNOSIS — M542 Cervicalgia: Secondary | ICD-10-CM | POA: Diagnosis not present

## 2020-12-11 DIAGNOSIS — M791 Myalgia, unspecified site: Secondary | ICD-10-CM | POA: Diagnosis not present

## 2020-12-11 MED FILL — BACLOFEN 10 MG TABS: 10 | 35 days supply | Qty: 20 | Fill #0

## 2020-12-11 MED FILL — ZONISAMIDE 100 MG CAPSULE: 100 | 30 days supply | Qty: 30 | Fill #0

## 2020-12-13 ENCOUNTER — Ambulatory Visit (INDEPENDENT_AMBULATORY_CARE_PROVIDER_SITE_OTHER): Payer: 59

## 2020-12-13 DIAGNOSIS — J309 Allergic rhinitis, unspecified: Secondary | ICD-10-CM

## 2020-12-16 ENCOUNTER — Other Ambulatory Visit (HOSPITAL_COMMUNITY): Payer: Self-pay

## 2020-12-16 DIAGNOSIS — J3081 Allergic rhinitis due to animal (cat) (dog) hair and dander: Secondary | ICD-10-CM | POA: Diagnosis not present

## 2020-12-16 NOTE — Progress Notes (Signed)
VIALS EXP 12-16-21

## 2020-12-17 DIAGNOSIS — J302 Other seasonal allergic rhinitis: Secondary | ICD-10-CM | POA: Diagnosis not present

## 2020-12-20 ENCOUNTER — Ambulatory Visit (INDEPENDENT_AMBULATORY_CARE_PROVIDER_SITE_OTHER): Payer: 59

## 2020-12-20 DIAGNOSIS — J309 Allergic rhinitis, unspecified: Secondary | ICD-10-CM | POA: Diagnosis not present

## 2020-12-30 ENCOUNTER — Encounter: Payer: Self-pay | Admitting: Allergy & Immunology

## 2020-12-31 NOTE — Telephone Encounter (Signed)
If he is having trouble breathing he should come in for an appointment or at least do a televisit please. Thank you

## 2021-01-09 ENCOUNTER — Other Ambulatory Visit (HOSPITAL_COMMUNITY): Payer: Self-pay

## 2021-01-09 MED FILL — Buspirone HCl Tab 5 MG: ORAL | 30 days supply | Qty: 90 | Fill #0 | Status: AC

## 2021-01-13 ENCOUNTER — Other Ambulatory Visit (HOSPITAL_COMMUNITY): Payer: Self-pay

## 2021-01-13 MED FILL — Thyroid Tab 90 MG (1 1/2 Grain): ORAL | 30 days supply | Qty: 30 | Fill #0 | Status: AC

## 2021-01-13 MED FILL — Azelastine HCl Nasal Spray 0.1% (137 MCG/SPRAY): NASAL | 30 days supply | Qty: 30 | Fill #0 | Status: AC

## 2021-01-13 MED FILL — Losartan Potassium & Hydrochlorothiazide Tab 100-25 MG: ORAL | 30 days supply | Qty: 30 | Fill #0 | Status: AC

## 2021-01-23 ENCOUNTER — Ambulatory Visit (INDEPENDENT_AMBULATORY_CARE_PROVIDER_SITE_OTHER): Payer: 59 | Admitting: *Deleted

## 2021-01-23 DIAGNOSIS — J309 Allergic rhinitis, unspecified: Secondary | ICD-10-CM | POA: Diagnosis not present

## 2021-01-28 ENCOUNTER — Other Ambulatory Visit (INDEPENDENT_AMBULATORY_CARE_PROVIDER_SITE_OTHER): Payer: Self-pay | Admitting: Internal Medicine

## 2021-01-28 ENCOUNTER — Encounter (INDEPENDENT_AMBULATORY_CARE_PROVIDER_SITE_OTHER): Payer: 59 | Admitting: Internal Medicine

## 2021-02-03 ENCOUNTER — Ambulatory Visit (INDEPENDENT_AMBULATORY_CARE_PROVIDER_SITE_OTHER): Payer: 59

## 2021-02-03 DIAGNOSIS — J309 Allergic rhinitis, unspecified: Secondary | ICD-10-CM | POA: Diagnosis not present

## 2021-02-14 ENCOUNTER — Ambulatory Visit (INDEPENDENT_AMBULATORY_CARE_PROVIDER_SITE_OTHER): Payer: 59

## 2021-02-14 DIAGNOSIS — J309 Allergic rhinitis, unspecified: Secondary | ICD-10-CM | POA: Diagnosis not present

## 2021-02-20 ENCOUNTER — Ambulatory Visit (INDEPENDENT_AMBULATORY_CARE_PROVIDER_SITE_OTHER): Payer: 59 | Admitting: *Deleted

## 2021-02-20 ENCOUNTER — Other Ambulatory Visit (INDEPENDENT_AMBULATORY_CARE_PROVIDER_SITE_OTHER): Payer: Self-pay | Admitting: Internal Medicine

## 2021-02-20 DIAGNOSIS — J309 Allergic rhinitis, unspecified: Secondary | ICD-10-CM

## 2021-02-20 DIAGNOSIS — J4520 Mild intermittent asthma, uncomplicated: Secondary | ICD-10-CM

## 2021-02-21 ENCOUNTER — Other Ambulatory Visit (HOSPITAL_COMMUNITY): Payer: Self-pay

## 2021-03-07 ENCOUNTER — Ambulatory Visit (INDEPENDENT_AMBULATORY_CARE_PROVIDER_SITE_OTHER): Payer: 59

## 2021-03-07 DIAGNOSIS — J309 Allergic rhinitis, unspecified: Secondary | ICD-10-CM | POA: Diagnosis not present

## 2021-03-18 ENCOUNTER — Ambulatory Visit (INDEPENDENT_AMBULATORY_CARE_PROVIDER_SITE_OTHER): Payer: 59 | Admitting: *Deleted

## 2021-03-18 ENCOUNTER — Other Ambulatory Visit (HOSPITAL_COMMUNITY): Payer: Self-pay

## 2021-03-18 ENCOUNTER — Telehealth (INDEPENDENT_AMBULATORY_CARE_PROVIDER_SITE_OTHER): Payer: Self-pay

## 2021-03-18 DIAGNOSIS — J4520 Mild intermittent asthma, uncomplicated: Secondary | ICD-10-CM

## 2021-03-18 DIAGNOSIS — I10 Essential (primary) hypertension: Secondary | ICD-10-CM

## 2021-03-18 DIAGNOSIS — K449 Diaphragmatic hernia without obstruction or gangrene: Secondary | ICD-10-CM

## 2021-03-18 DIAGNOSIS — R5383 Other fatigue: Secondary | ICD-10-CM

## 2021-03-18 DIAGNOSIS — E782 Mixed hyperlipidemia: Secondary | ICD-10-CM

## 2021-03-18 DIAGNOSIS — F419 Anxiety disorder, unspecified: Secondary | ICD-10-CM

## 2021-03-18 DIAGNOSIS — J309 Allergic rhinitis, unspecified: Secondary | ICD-10-CM

## 2021-03-18 DIAGNOSIS — E559 Vitamin D deficiency, unspecified: Secondary | ICD-10-CM

## 2021-03-18 DIAGNOSIS — K219 Gastro-esophageal reflux disease without esophagitis: Secondary | ICD-10-CM

## 2021-03-18 MED ORDER — PANTOPRAZOLE SODIUM 40 MG PO TBEC
DELAYED_RELEASE_TABLET | Freq: Every day | ORAL | 0 refills | Status: DC
  Filled 2021-03-18: qty 90, 90d supply, fill #0

## 2021-03-18 MED ORDER — THYROID 90 MG PO TABS
90.0000 mg | ORAL_TABLET | Freq: Every day | ORAL | 0 refills | Status: DC
  Filled 2021-03-18: qty 30, 30d supply, fill #0

## 2021-03-18 MED ORDER — LOSARTAN POTASSIUM-HCTZ 100-25 MG PO TABS
1.0000 | ORAL_TABLET | Freq: Every day | ORAL | 0 refills | Status: DC
  Filled 2021-03-18: qty 30, 30d supply, fill #0

## 2021-03-18 MED ORDER — BUSPIRONE HCL 5 MG PO TABS
ORAL_TABLET | ORAL | 0 refills | Status: DC
  Filled 2021-03-18: qty 90, 30d supply, fill #0

## 2021-03-18 MED ORDER — MONTELUKAST SODIUM 10 MG PO TABS
ORAL_TABLET | Freq: Every day | ORAL | 0 refills | Status: DC
  Filled 2021-03-18: qty 30, 30d supply, fill #0

## 2021-03-18 MED ORDER — SIMVASTATIN 40 MG PO TABS
ORAL_TABLET | Freq: Every day | ORAL | 0 refills | Status: DC
  Filled 2021-03-18: qty 30, 30d supply, fill #0

## 2021-03-18 NOTE — Telephone Encounter (Signed)
Patient called to reschedule his physical to 04/01/2021 and stated that he needs a refill of the following medications sent to [facility]:  pantoprazole (PROTONIX) 40 MG tablet  Last filled 12/09/2020, # 90 with 0 refills  busPIRone (BUSPAR) 5 MG tablet  Last filled 12/09/2020, # 90 with 0 refills  thyroid (ARMOUR) 90 MG tablet  Last filled 11/11/2020, # 30 with 3 refills  montelukast (SINGULAIR) 10 MG tablet  Last filled 11/11/2020, # 90 with 0 refills  losartan-hydrochlorothiazide (HYZAAR) 100-25 MG tablet  Last filled 08/12/2020, # 90 with 0 refills  simvastatin (ZOCOR) 40 MG tablet  Last filled 05/28/2020, # 90 with 3 refills  Last OV 10/31/2020  Next OV 04/01/2021

## 2021-03-18 NOTE — Telephone Encounter (Signed)
30-day supply sent to [facility] outpatient pharmacy.

## 2021-03-19 ENCOUNTER — Other Ambulatory Visit (HOSPITAL_COMMUNITY): Payer: Self-pay

## 2021-03-27 ENCOUNTER — Encounter (INDEPENDENT_AMBULATORY_CARE_PROVIDER_SITE_OTHER): Payer: Self-pay | Admitting: Nurse Practitioner

## 2021-03-27 ENCOUNTER — Ambulatory Visit (INDEPENDENT_AMBULATORY_CARE_PROVIDER_SITE_OTHER): Payer: 59 | Admitting: *Deleted

## 2021-03-27 ENCOUNTER — Encounter (INDEPENDENT_AMBULATORY_CARE_PROVIDER_SITE_OTHER): Payer: Self-pay | Admitting: Internal Medicine

## 2021-03-27 ENCOUNTER — Ambulatory Visit (INDEPENDENT_AMBULATORY_CARE_PROVIDER_SITE_OTHER): Payer: 59 | Admitting: Internal Medicine

## 2021-03-27 ENCOUNTER — Other Ambulatory Visit: Payer: Self-pay

## 2021-03-27 VITALS — BP 128/80 | HR 87 | Temp 97.6°F | Resp 18 | Ht 70.0 in | Wt 230.0 lb

## 2021-03-27 DIAGNOSIS — H66001 Acute suppurative otitis media without spontaneous rupture of ear drum, right ear: Secondary | ICD-10-CM

## 2021-03-27 DIAGNOSIS — J309 Allergic rhinitis, unspecified: Secondary | ICD-10-CM

## 2021-03-27 DIAGNOSIS — H9311 Tinnitus, right ear: Secondary | ICD-10-CM | POA: Diagnosis not present

## 2021-03-27 MED ORDER — AMOXICILLIN-POT CLAVULANATE 875-125 MG PO TABS: 1.0000 | ORAL_TABLET | Freq: Two times a day (BID) | ORAL | 0 refills | Status: DC

## 2021-03-27 NOTE — Progress Notes (Signed)
Metrics: Intervention Frequency ACO  Documented Smoking Status Yearly  Screened one or more times in 24 months  Cessation Counseling or  Active cessation medication Past 24 months  Past 24 months   Guideline developer: UpToDate (See UpToDate for funding source) Date Released: 2014       Wellness Office Visit  Subjective:  Patient ID: Sergio [name], male    DOB: Nov 06, 1975  Age: 45 y.o. MRN: [id number]  CC: Ringing in the right ear and reduced hearing. HPI  The patient comes in for an acute visit with the above symptoms which started this morning.  He denies any fever, headache, neurological symptoms. Past Medical History:  Diagnosis Date   Asthma with allergic rhinitis 03/11/2008   Essential hypertension 05/06/2012   Gastroesophageal reflux disease with hiatal hernia 05/06/2012   Gastroesophageal reflux disease with hiatal hernia    Hyperlipidemia LDL goal < 160 03/11/2008   Irritable bowel syndrome with constipation and diarrhea 05/06/2012   Migraine    Obesity (BMI 30.0-34.9) 03/03/2013   Obstructive sleep apnea 11/19/2008   Nocturnal polysomnography 12/14/2008: AHI 20.8, O2 Sat nadir 84%, optimal CPAP 12 cm H2O    Urticaria    Past Surgical History:  Procedure Laterality Date   URETHRAL DILATION       Family History  Problem Relation Age of Onset   Early death Father        Accident   Hypertension Maternal Grandmother    Vaginal cancer Maternal Grandmother    Parkinson's disease Maternal Grandmother    Diabetes Paternal Grandfather    ALS Paternal Grandfather    Breast cancer Paternal Grandmother    Kidney cancer Paternal 28    Healthy Mother    Healthy Sister    Healthy Sister    Hypertension Maternal Uncle    Diabetes Paternal Aunt    COPD Maternal Grandfather    Urticaria Neg Hx    Immunodeficiency Neg Hx    Eczema Neg Hx    Atopy Neg Hx    Angioedema Neg Hx    Allergic rhinitis Neg Hx    Asthma Neg Hx     Social History   Social History  Narrative   Works for [employer] in the [employer].  Married for 23 years, no children.   Right-handed.   Occasionally caffeine use.   Social History   Tobacco Use   Smoking status: Never   Smokeless tobacco: Never  Substance Use Topics   Alcohol use: Yes    Comment: occ    Current Meds  Medication Sig   amoxicillin-clavulanate (AUGMENTIN) 875-125 MG tablet Take 1 tablet by mouth 2 (two) times daily.   Azelastine HCl 137 MCG/SPRAY SOLN INSTILL 2 SPRAYS INTO BOTH NOSTRILS TWICE DAILY   baclofen (LIORESAL) 10 MG tablet Take 10 mg by mouth 2 (two) times daily. Max of 1-2 treatments weekly   baclofen (LIORESAL) 10 MG tablet TAKE 1 TABLET BY MOUTH 2 TIMES DAILY AS NEEDED (LIMIT 1-2 TREATMENTS PER WEEK)   baclofen (LIORESAL) 10 MG tablet TAKE 1 TABLET BY MOUTH 2 TIMES DAILY AS NEEDED. LIMIT TO 1-2 TREATMENTS PER WEEK AS DIRECTED   baclofen (LIORESAL) 10 MG tablet TAKE 1 TABLET BY MOUTH TWO TIMES DAILY AS NEEDED. MAX OF 1-2 TREATMENTS WEEKLY   budesonide-formoterol (SYMBICORT) 160-4.5 MCG/ACT inhaler INHALE 2 PUFFS INTO THE LUNGS TWO TIMES DAILY   busPIRone (BUSPAR) 5 MG tablet TAKE 1 TABLET BY MOUTH 3 TIMES DAILY (NEEDS APPT)   Cholecalciferol (VITAMIN D3) 125  MCG (5000 UT) TABS Take by mouth.   diclofenac (VOLTAREN) 50 MG EC tablet Take 50 mg by mouth 2 (two) times daily. Only for 3 days   diclofenac (VOLTAREN) 50 MG EC tablet TAKE 1 TABLET BY MOUTH TWO TIMES DAILY FOR 3 DAYS   EPINEPHrine 0.3 mg/0.3 mL IJ SOAJ injection Inject 0.3 mLs (0.3 mg total) into the muscle as needed for anaphylaxis.   Fexofenadine-Pseudoephedrine (ALLEGRA-D 24 HOUR PO) Take by mouth daily.   levalbuterol (XOPENEX HFA) 45 MCG/ACT inhaler 4 puffs every 4-6 hours as needed.   losartan-hydrochlorothiazide (HYZAAR) 100-25 MG tablet TAKE 1 TABLET BY MOUTH ONCE A DAY   montelukast (SINGULAIR) 10 MG tablet TAKE 1 TABLET BY MOUTH ONCE A DAY   pantoprazole (PROTONIX) 40 MG tablet TAKE 1 TABLET (40 MG TOTAL) BY MOUTH  DAILY.   predniSONE (STERAPRED UNI-PAK 21 TAB) 10 MG (21) TBPK tablet TAKE AS DIRECTED PER PACKAGE INSTRUCTIONS   simvastatin (ZOCOR) 40 MG tablet TAKE 1 TABLET BY MOUTH AT BEDTIME   Testosterone 20 % CREA Apply 100 mg topically daily.   thyroid (ARMOUR) 90 MG tablet Take 1 tablet (90 mg total) by mouth daily.   zonisamide (ZONEGRAN) 100 MG capsule TAKE 1 CAPSULE BY MOUTH ONCE A DAY   zonisamide (ZONEGRAN) 100 MG capsule TAKE 1 CAPSULE BY MOUTH DAILY   zonisamide (ZONEGRAN) 25 MG capsule Take 100 mg by mouth daily.   zonisamide (ZONEGRAN) 25 MG capsule TAKE 4 CAPSULES BY MOUTH ONCE A DAY   zonisamide (ZONEGRAN) 25 MG capsule TAKE 4 CAPSULES BY MOUTH ONCE A DAY AS DIRECTED       Objective:   Today's Vitals: BP 128/80 (BP Location: Left Arm, Patient Position: Sitting, Cuff Size: Normal)   Pulse 87   Temp 97.6 F (36.4 C) (Temporal)   Resp 18   Ht 5\' 10"  (1.778 m)   Wt 230 lb (104.3 kg)   SpO2 98%   BMI 33.00 kg/m  Vitals with BMI 03/27/2021 10/31/2020 07/11/2020  Height 5\' 10"  5\' 10"  5\' 10"   Weight 230 lbs 223 lbs 4 oz 223 lbs  BMI 33 98.92 32  Systolic 119 417 408  Diastolic 80 78 80  Pulse 87 86 77     Physical Exam  He looks systemically well.  He is afebrile.  Right tympanic membrane does look cloudy compared to the left although even the left is somewhat injected.     Assessment   1. Tinnitus of right ear   2. Non-recurrent acute suppurative otitis media of right ear without spontaneous rupture of tympanic membrane       Tests ordered No orders of the defined types were placed in this encounter.    Plan: 1.  I will treat him for otitis media with Augmentin.  If he does not improve, we may need to send him to ENT.  He is going to be seeing Judson Roch in the next 5 days for an annual physical exam so we can check up with him then.    Meds ordered this encounter  Medications   amoxicillin-clavulanate (AUGMENTIN) 875-125 MG tablet    Sig: Take 1 tablet by mouth  2 (two) times daily.    Dispense:  20 tablet    Refill:  0     Gillian Meeuwsen Luther Parody, MD

## 2021-03-29 DIAGNOSIS — H52223 Regular astigmatism, bilateral: Secondary | ICD-10-CM | POA: Diagnosis not present

## 2021-04-01 ENCOUNTER — Other Ambulatory Visit (HOSPITAL_COMMUNITY): Payer: Self-pay

## 2021-04-01 ENCOUNTER — Other Ambulatory Visit: Payer: Self-pay

## 2021-04-01 ENCOUNTER — Encounter (INDEPENDENT_AMBULATORY_CARE_PROVIDER_SITE_OTHER): Payer: Self-pay | Admitting: Nurse Practitioner

## 2021-04-01 ENCOUNTER — Ambulatory Visit (INDEPENDENT_AMBULATORY_CARE_PROVIDER_SITE_OTHER): Payer: 59 | Admitting: Nurse Practitioner

## 2021-04-01 VITALS — BP 114/76 | HR 94 | Temp 97.5°F | Ht 68.0 in | Wt 231.2 lb

## 2021-04-01 DIAGNOSIS — E559 Vitamin D deficiency, unspecified: Secondary | ICD-10-CM

## 2021-04-01 DIAGNOSIS — I10 Essential (primary) hypertension: Secondary | ICD-10-CM | POA: Diagnosis not present

## 2021-04-01 DIAGNOSIS — M791 Myalgia, unspecified site: Secondary | ICD-10-CM | POA: Diagnosis not present

## 2021-04-01 DIAGNOSIS — M542 Cervicalgia: Secondary | ICD-10-CM | POA: Diagnosis not present

## 2021-04-01 DIAGNOSIS — G43719 Chronic migraine without aura, intractable, without status migrainosus: Secondary | ICD-10-CM | POA: Diagnosis not present

## 2021-04-01 DIAGNOSIS — R109 Unspecified abdominal pain: Secondary | ICD-10-CM | POA: Diagnosis not present

## 2021-04-01 DIAGNOSIS — R5383 Other fatigue: Secondary | ICD-10-CM

## 2021-04-01 DIAGNOSIS — Z0001 Encounter for general adult medical examination with abnormal findings: Secondary | ICD-10-CM | POA: Diagnosis not present

## 2021-04-01 DIAGNOSIS — G43901 Migraine, unspecified, not intractable, with status migrainosus: Secondary | ICD-10-CM | POA: Diagnosis not present

## 2021-04-01 MED ORDER — BACLOFEN 10 MG PO TABS
ORAL_TABLET | ORAL | 1 refills | Status: DC
  Filled 2021-04-01: qty 20, 35d supply, fill #0

## 2021-04-01 MED ORDER — DICYCLOMINE HCL 10 MG PO CAPS
10.0000 mg | ORAL_CAPSULE | Freq: Three times a day (TID) | ORAL | 2 refills | Status: DC
Start: 2021-04-01 — End: 2021-11-10
  Filled 2021-04-01: qty 90, 30d supply, fill #0

## 2021-04-01 MED ORDER — ZONISAMIDE 100 MG PO CAPS
ORAL_CAPSULE | ORAL | 2 refills | Status: DC
  Filled 2021-04-01: qty 30, 30d supply, fill #0

## 2021-04-01 NOTE — Progress Notes (Signed)
Subjective:  Patient ID: Sergio [name], male    DOB: July 31, 1976  Age: 45 y.o. MRN: [id number]  CC:  Chief Complaint  Patient presents with   Annual Exam    Doing well      HPI  This patient arrives today for the above.  He is due for blood work today he is also due for colon cancer screening.  He also mentions that he has been having abdominal pain, abdominal cramping, and abdominal bloating.  He also experiences nausea after eating food.  He tells me he has had intermittent issues with his gastrointestinal system for years but over the last 2 months his symptoms seem to have worsened.  He denies any mucus or blood in his stool.  He does report having multiple loose stools during the day.  He has had a colonoscopy in the past which he tells me showed some thickening of the lining of his stomach which he was told may represent early Crohn's disease.  I do not see evidence of this in his electronic medical record.  He denies constipation.   Of note, he was seen in the office last week for ringing in the ears.  He was diagnosed with bilateral ear infection and was started on Augmentin.  He tells me since starting the Augmentin the ringing has vastly improved.  Past Medical History:  Diagnosis Date   Asthma with allergic rhinitis 03/11/2008   Essential hypertension 05/06/2012   Gastroesophageal reflux disease with hiatal hernia 05/06/2012   Gastroesophageal reflux disease with hiatal hernia    Hyperlipidemia LDL goal < 160 03/11/2008   Irritable bowel syndrome with constipation and diarrhea 05/06/2012   Migraine    Obesity (BMI 30.0-34.9) 03/03/2013   Obstructive sleep apnea 11/19/2008   Nocturnal polysomnography 12/14/2008: AHI 20.8, O2 Sat nadir 84%, optimal CPAP 12 cm H2O    Urticaria       Family History  Problem Relation Age of Onset   Early death Father        Accident   Hypertension Maternal Grandmother    Vaginal cancer Maternal Grandmother    Parkinson's disease Maternal  Grandmother    Diabetes Paternal Grandfather    ALS Paternal Grandfather    Breast cancer Paternal Grandmother    Kidney cancer Paternal 46    Healthy Mother    Healthy Sister    Healthy Sister    Hypertension Maternal Uncle    Diabetes Paternal Aunt    COPD Maternal Grandfather    Urticaria Neg Hx    Immunodeficiency Neg Hx    Eczema Neg Hx    Atopy Neg Hx    Angioedema Neg Hx    Allergic rhinitis Neg Hx    Asthma Neg Hx     Social History   Social History Narrative   Works for [employer] in the [employer].  Married for 23 years, no children.   Right-handed.   Occasionally caffeine use.   Social History   Tobacco Use   Smoking status: Never   Smokeless tobacco: Never  Substance Use Topics   Alcohol use: Yes    Comment: occ     Current Meds  Medication Sig   amoxicillin-clavulanate (AUGMENTIN) 875-125 MG tablet Take 1 tablet by mouth 2 (two) times daily.   Azelastine HCl 137 MCG/SPRAY SOLN INSTILL 2 SPRAYS INTO BOTH NOSTRILS TWICE DAILY   baclofen (LIORESAL) 10 MG tablet Take 1 tablet by mouth twice daily as needed. Limit 1-2 treatments  per week   budesonide-formoterol (SYMBICORT) 160-4.5 MCG/ACT inhaler INHALE 2 PUFFS INTO THE LUNGS TWO TIMES DAILY   busPIRone (BUSPAR) 5 MG tablet TAKE 1 TABLET BY MOUTH 3 TIMES DAILY (NEEDS APPT)   Cholecalciferol (VITAMIN D3) 125 MCG (5000 UT) TABS Take by mouth.   diclofenac (VOLTAREN) 50 MG EC tablet TAKE 1 TABLET BY MOUTH TWO TIMES DAILY FOR 3 DAYS   dicyclomine (BENTYL) 10 MG capsule Take 1 capsule (10 mg total) by mouth 3 (three) times daily before meals.   EPINEPHrine 0.3 mg/0.3 mL IJ SOAJ injection Inject 0.3 mLs (0.3 mg total) into the muscle as needed for anaphylaxis.   levalbuterol (XOPENEX HFA) 45 MCG/ACT inhaler 4 puffs every 4-6 hours as needed.   losartan-hydrochlorothiazide (HYZAAR) 100-25 MG tablet TAKE 1 TABLET BY MOUTH ONCE A DAY   montelukast (SINGULAIR) 10 MG tablet TAKE 1 TABLET BY MOUTH ONCE A  DAY   pantoprazole (PROTONIX) 40 MG tablet TAKE 1 TABLET (40 MG TOTAL) BY MOUTH DAILY.   simvastatin (ZOCOR) 40 MG tablet TAKE 1 TABLET BY MOUTH AT BEDTIME   Testosterone 20 % CREA Apply 100 mg topically daily.   thyroid (ARMOUR) 90 MG tablet Take 1 tablet (90 mg total) by mouth daily.   zonisamide (ZONEGRAN) 100 MG capsule Take 1 capsule by mouth daily as directed   [DISCONTINUED] baclofen (LIORESAL) 10 MG tablet TAKE 1 TABLET BY MOUTH 2 TIMES DAILY AS NEEDED (LIMIT 1-2 TREATMENTS PER WEEK)   [DISCONTINUED] baclofen (LIORESAL) 10 MG tablet TAKE 1 TABLET BY MOUTH 2 TIMES DAILY AS NEEDED. LIMIT TO 1-2 TREATMENTS PER WEEK AS DIRECTED   [DISCONTINUED] baclofen (LIORESAL) 10 MG tablet TAKE 1 TABLET BY MOUTH TWO TIMES DAILY AS NEEDED. MAX OF 1-2 TREATMENTS WEEKLY   [DISCONTINUED] zonisamide (ZONEGRAN) 100 MG capsule TAKE 1 CAPSULE BY MOUTH ONCE A DAY   [DISCONTINUED] zonisamide (ZONEGRAN) 100 MG capsule TAKE 1 CAPSULE BY MOUTH DAILY   [DISCONTINUED] zonisamide (ZONEGRAN) 25 MG capsule Take 100 mg by mouth daily.   [DISCONTINUED] zonisamide (ZONEGRAN) 25 MG capsule TAKE 4 CAPSULES BY MOUTH ONCE A DAY   [DISCONTINUED] zonisamide (ZONEGRAN) 25 MG capsule TAKE 4 CAPSULES BY MOUTH ONCE A DAY AS DIRECTED    ROS:  Review of Systems  Respiratory:  Negative for shortness of breath.   Cardiovascular:  Negative for chest pain.  Gastrointestinal:  Positive for abdominal pain (cramping regardless of meals), diarrhea and nausea (after meals). Negative for blood in stool, constipation, heartburn, melena and vomiting.  Neurological:  Positive for headaches (undergoing treatment for migraines). Negative for dizziness.    Objective:   Today's Vitals: BP 114/76   Pulse 94   Temp (!) 97.5 F (36.4 C) (Temporal)   Ht 5' 8"  (1.727 m)   Wt 231 lb 3.2 oz (104.9 kg)   SpO2 94%   BMI 35.15 kg/m  Vitals with BMI 04/01/2021 03/27/2021 10/31/2020  Height 5' 8"  5' 10"  5' 10"   Weight 231 lbs 3 oz 230 lbs 223 lbs 4 oz   BMI 16.10 33 96.04  Systolic 540 981 191  Diastolic 76 80 78  Pulse 94 87 86     Physical Exam Vitals reviewed.  Constitutional:      General: He is not in acute distress.    Appearance: Normal appearance. He is not ill-appearing.  HENT:     Head: Normocephalic and atraumatic.     Right Ear: Tympanic membrane, ear canal and external ear normal.     Left Ear: Tympanic  membrane and external ear normal.     Ears:     Comments: Mild redness to left ear canal Eyes:     General: No scleral icterus.    Extraocular Movements: Extraocular movements intact.     Conjunctiva/sclera: Conjunctivae normal.     Pupils: Pupils are equal, round, and reactive to light.  Neck:     Vascular: No carotid bruit.  Cardiovascular:     Rate and Rhythm: Normal rate and regular rhythm.     Pulses: Normal pulses.     Heart sounds: Normal heart sounds.  Pulmonary:     Effort: Pulmonary effort is normal.     Breath sounds: Normal breath sounds.  Abdominal:     General: Bowel sounds are decreased. There is no distension or abdominal bruit.     Palpations: There is no mass.     Tenderness: There is abdominal tenderness in the right lower quadrant and left lower quadrant. There is no guarding or rebound. Negative signs include Murphy's sign.     Hernia: No hernia is present.  Musculoskeletal:        General: No swelling or tenderness.     Cervical back: Normal range of motion and neck supple. No rigidity.  Lymphadenopathy:     Cervical: No cervical adenopathy.  Skin:    General: Skin is warm and dry.  Neurological:     General: No focal deficit present.     Mental Status: He is alert and oriented to person, place, and time.     Cranial Nerves: No cranial nerve deficit.     Sensory: No sensory deficit.     Motor: No weakness.     Gait: Gait normal.  Psychiatric:        Mood and Affect: Mood normal.        Behavior: Behavior normal.        Judgment: Judgment normal.         Assessment and  Plan   1. Encounter for general adult medical examination with abnormal findings   2. Abdominal pain, unspecified abdominal location   3. Fatigue, unspecified type   4. Vitamin D deficiency disease   5. Essential hypertension      Plan: 1.  Overall exam was within normal limits.  He did have some redness to his left ear canal but otherwise no significant abnormalities noted.  He was encouraged to continue taking his Augmentin and to let me know if the ringing in the ears comes back which point I would recommend referral to ear nose and throat doctor. 2.  Abdominal exam benign for the most part.  He did have some mild tenderness to deep palpation in the right and left lower quadrants.  We will trial Bentyl to see if this helps with the cramping and I will refer him to gastroenterology.  I do not see any signs of hemorrhage or bowel obstruction at this time, but I did tell him if he takes a dose of Bentyl and symptoms worsen he needs not to repeat the dose and to call this office.  If symptoms worsen at all between now and his next appoint with [place] or appointment with gastroenterology I would recommend imaging of the abdomen for further evaluation.  He was also told if he does not hear about getting an appointment scheduled in 10 business days with the gastroenterologist to notify our office so we can look into any issues with the referral.  He tells me he understands. 3.  We will check blood work today for further evaluation. 4.  We will check a vitamin D level today. 5.  Blood pressure under good control on current regimen he will continue taking medication as prescribed and we will check blood work for further evaluation.  Tests ordered Orders Placed This Encounter  Procedures   Testosterone Total,Free,Bio, Males   CMP with eGFR([facility])   CBC   Hemoglobin A1c   TSH   T3, Free   T4, Free   Vitamin D, 25-hydroxy   Ambulatory referral to Gastroenterology      Meds ordered this encounter   Medications   dicyclomine (BENTYL) 10 MG capsule    Sig: Take 1 capsule (10 mg total) by mouth 3 (three) times daily before meals.    Dispense:  90 capsule    Refill:  2    Order Specific Question:   Supervising Provider    Answer:   Doree Albee [[id number]]    Patient to follow-up in 3 to 6 months or sooner as needed.  Ailene Ards, NP

## 2021-04-02 ENCOUNTER — Encounter (INDEPENDENT_AMBULATORY_CARE_PROVIDER_SITE_OTHER): Payer: Self-pay | Admitting: Internal Medicine

## 2021-04-02 LAB — CBC
HCT: 49.7 % (ref 38.5–50.0)
Hemoglobin: 16.8 g/dL (ref 13.2–17.1)
MCH: 28.8 pg (ref 27.0–33.0)
MCHC: 33.8 g/dL (ref 32.0–36.0)
MCV: 85.1 fL (ref 80.0–100.0)
MPV: 10.7 fL (ref 7.5–12.5)
Platelets: 320 10*3/uL (ref 140–400)
RBC: 5.84 10*6/uL — ABNORMAL HIGH (ref 4.20–5.80)
RDW: 13.4 % (ref 11.0–15.0)
WBC: 12.5 10*3/uL — ABNORMAL HIGH (ref 3.8–10.8)

## 2021-04-02 LAB — COMPLETE METABOLIC PANEL WITH GFR
AG Ratio: 1.5 (calc) (ref 1.0–2.5)
ALT: 25 U/L (ref 9–46)
AST: 21 U/L (ref 10–40)
Albumin: 4.8 g/dL (ref 3.6–5.1)
Alkaline phosphatase (APISO): 69 U/L (ref 36–130)
BUN: 14 mg/dL (ref 7–25)
CO2: 29 mmol/L (ref 20–32)
Calcium: 10.1 mg/dL (ref 8.6–10.3)
Chloride: 100 mmol/L (ref 98–110)
Creat: 1.11 mg/dL (ref 0.60–1.29)
Globulin: 3.1 g/dL (calc) (ref 1.9–3.7)
Glucose, Bld: 100 mg/dL (ref 65–139)
Potassium: 4.6 mmol/L (ref 3.5–5.3)
Sodium: 137 mmol/L (ref 135–146)
Total Bilirubin: 0.5 mg/dL (ref 0.2–1.2)
Total Protein: 7.9 g/dL (ref 6.1–8.1)
eGFR: 82 mL/min/{1.73_m2} (ref >=60)

## 2021-04-02 LAB — TSH: TSH: 1.04 mIU/L (ref 0.40–4.50)

## 2021-04-02 LAB — TESTOSTERONE TOTAL,FREE,BIO, MALES
Albumin: 4.8 g/dL (ref 3.6–5.1)
Sex Hormone Binding: 32 nmol/L (ref 10–50)
Testosterone, Bioavailable: 100.1 ng/dL — ABNORMAL LOW (ref 110.0–575.0)
Testosterone, Free: 45.8 pg/mL — ABNORMAL LOW (ref 46.0–224.0)
Testosterone: 353 ng/dL (ref 250–827)

## 2021-04-02 LAB — HEMOGLOBIN A1C
Hgb A1c MFr Bld: 5.3 % of total Hgb (ref <5.7)
Mean Plasma Glucose: 105 mg/dL
eAG (mmol/L): 5.8 mmol/L

## 2021-04-02 LAB — T4, FREE: Free T4: 1.1 ng/dL (ref 0.8–1.8)

## 2021-04-02 LAB — VITAMIN D 25 HYDROXY (VIT D DEFICIENCY, FRACTURES): Vit D, 25-Hydroxy: 56 ng/mL (ref 30–100)

## 2021-04-02 LAB — T3, FREE: T3, Free: 3.7 pg/mL (ref 2.3–4.2)

## 2021-04-03 ENCOUNTER — Encounter (INDEPENDENT_AMBULATORY_CARE_PROVIDER_SITE_OTHER): Payer: Self-pay | Admitting: *Deleted

## 2021-04-04 ENCOUNTER — Ambulatory Visit (INDEPENDENT_AMBULATORY_CARE_PROVIDER_SITE_OTHER): Payer: 59

## 2021-04-04 DIAGNOSIS — J309 Allergic rhinitis, unspecified: Secondary | ICD-10-CM | POA: Diagnosis not present

## 2021-04-10 ENCOUNTER — Encounter (INDEPENDENT_AMBULATORY_CARE_PROVIDER_SITE_OTHER): Payer: Self-pay | Admitting: Nurse Practitioner

## 2021-04-10 ENCOUNTER — Other Ambulatory Visit (INDEPENDENT_AMBULATORY_CARE_PROVIDER_SITE_OTHER): Payer: Self-pay | Admitting: Nurse Practitioner

## 2021-04-10 DIAGNOSIS — U071 COVID-19: Secondary | ICD-10-CM

## 2021-04-10 MED ORDER — MOLNUPIRAVIR EUA 200MG CAPSULE: 4.0000 | ORAL_CAPSULE | Freq: Two times a day (BID) | ORAL | 0 refills | Status: AC

## 2021-04-17 ENCOUNTER — Encounter: Payer: Self-pay | Admitting: Allergy & Immunology

## 2021-04-18 ENCOUNTER — Other Ambulatory Visit: Payer: Self-pay

## 2021-04-18 ENCOUNTER — Other Ambulatory Visit (HOSPITAL_COMMUNITY): Payer: Self-pay

## 2021-04-18 MED ORDER — PREDNISONE 10 MG PO TABS
ORAL_TABLET | ORAL | 0 refills | Status: DC
  Filled 2021-04-18: qty 15, 5d supply, fill #0

## 2021-04-20 ENCOUNTER — Other Ambulatory Visit (INDEPENDENT_AMBULATORY_CARE_PROVIDER_SITE_OTHER): Payer: Self-pay | Admitting: Nurse Practitioner

## 2021-04-20 DIAGNOSIS — E782 Mixed hyperlipidemia: Secondary | ICD-10-CM

## 2021-04-20 DIAGNOSIS — R5383 Other fatigue: Secondary | ICD-10-CM

## 2021-04-20 DIAGNOSIS — I10 Essential (primary) hypertension: Secondary | ICD-10-CM

## 2021-04-20 DIAGNOSIS — J4520 Mild intermittent asthma, uncomplicated: Secondary | ICD-10-CM

## 2021-04-21 ENCOUNTER — Other Ambulatory Visit (HOSPITAL_COMMUNITY): Payer: Self-pay

## 2021-04-21 MED ORDER — SIMVASTATIN 40 MG PO TABS
ORAL_TABLET | Freq: Every day | ORAL | 0 refills | Status: DC
Start: 2021-04-21 — End: 2021-08-01
  Filled 2021-04-21: qty 90, 90d supply, fill #0

## 2021-04-21 MED ORDER — MONTELUKAST SODIUM 10 MG PO TABS
ORAL_TABLET | Freq: Every day | ORAL | 0 refills | Status: DC
  Filled 2021-04-21: qty 90, 90d supply, fill #0

## 2021-04-21 MED ORDER — LOSARTAN POTASSIUM-HCTZ 100-25 MG PO TABS
1.0000 | ORAL_TABLET | Freq: Every day | ORAL | 0 refills | Status: DC
  Filled 2021-04-21: qty 90, 90d supply, fill #0

## 2021-04-21 MED ORDER — THYROID 90 MG PO TABS
90.0000 mg | ORAL_TABLET | Freq: Every day | ORAL | 0 refills | Status: DC
Start: 2021-04-21 — End: 2021-08-01
  Filled 2021-04-21: qty 90, 90d supply, fill #0

## 2021-05-09 ENCOUNTER — Ambulatory Visit (INDEPENDENT_AMBULATORY_CARE_PROVIDER_SITE_OTHER): Payer: 59 | Admitting: *Deleted

## 2021-05-09 DIAGNOSIS — J309 Allergic rhinitis, unspecified: Secondary | ICD-10-CM

## 2021-05-14 ENCOUNTER — Ambulatory Visit (INDEPENDENT_AMBULATORY_CARE_PROVIDER_SITE_OTHER): Payer: 59

## 2021-05-14 DIAGNOSIS — J309 Allergic rhinitis, unspecified: Secondary | ICD-10-CM | POA: Diagnosis not present

## 2021-05-29 ENCOUNTER — Ambulatory Visit (INDEPENDENT_AMBULATORY_CARE_PROVIDER_SITE_OTHER): Payer: 59 | Admitting: *Deleted

## 2021-05-29 DIAGNOSIS — J309 Allergic rhinitis, unspecified: Secondary | ICD-10-CM | POA: Diagnosis not present

## 2021-07-03 ENCOUNTER — Encounter: Payer: Self-pay | Admitting: Allergy & Immunology

## 2021-07-03 MED FILL — Azelastine HCl Nasal Spray 0.1% (137 MCG/SPRAY): NASAL | 30 days supply | Qty: 30 | Fill #1 | Status: AC

## 2021-07-04 ENCOUNTER — Other Ambulatory Visit (HOSPITAL_COMMUNITY): Payer: Self-pay

## 2021-07-07 ENCOUNTER — Other Ambulatory Visit (HOSPITAL_COMMUNITY): Payer: Self-pay

## 2021-07-07 DIAGNOSIS — M791 Myalgia, unspecified site: Secondary | ICD-10-CM | POA: Diagnosis not present

## 2021-07-07 DIAGNOSIS — G43719 Chronic migraine without aura, intractable, without status migrainosus: Secondary | ICD-10-CM | POA: Diagnosis not present

## 2021-07-07 DIAGNOSIS — G43901 Migraine, unspecified, not intractable, with status migrainosus: Secondary | ICD-10-CM | POA: Diagnosis not present

## 2021-07-07 DIAGNOSIS — M542 Cervicalgia: Secondary | ICD-10-CM | POA: Diagnosis not present

## 2021-07-07 MED ORDER — BACLOFEN 10 MG PO TABS
ORAL_TABLET | ORAL | 1 refills | Status: DC
  Filled 2021-07-07: qty 20, 30d supply, fill #0

## 2021-07-07 MED ORDER — ZONISAMIDE 100 MG PO CAPS
100.0000 mg | ORAL_CAPSULE | Freq: Every day | ORAL | 2 refills | Status: DC
  Filled 2021-07-07 – 2021-12-03 (×2): qty 30, 30d supply, fill #0

## 2021-07-10 ENCOUNTER — Encounter (INDEPENDENT_AMBULATORY_CARE_PROVIDER_SITE_OTHER): Payer: Self-pay

## 2021-07-10 ENCOUNTER — Encounter (INDEPENDENT_AMBULATORY_CARE_PROVIDER_SITE_OTHER): Payer: Self-pay | Admitting: Gastroenterology

## 2021-07-10 ENCOUNTER — Other Ambulatory Visit (HOSPITAL_COMMUNITY): Payer: Self-pay

## 2021-07-10 ENCOUNTER — Ambulatory Visit (INDEPENDENT_AMBULATORY_CARE_PROVIDER_SITE_OTHER): Payer: 59 | Admitting: Gastroenterology

## 2021-07-10 ENCOUNTER — Other Ambulatory Visit: Payer: Self-pay

## 2021-07-10 VITALS — BP 135/90 | HR 91 | Temp 97.8°F | Ht 70.0 in | Wt 231.4 lb

## 2021-07-10 DIAGNOSIS — K582 Mixed irritable bowel syndrome: Secondary | ICD-10-CM | POA: Diagnosis not present

## 2021-07-10 NOTE — H&P (View-Only) (Signed)
Sergio [name], M.D. Gastroenterology & Hepatology [facility] [address] [place], [place] [address] Primary Care Physician: Noreene Larsson, NP [address]  [place] [address] [place] [address]  Referring MD: PCP  Chief Complaint: Abdominal pain, bloating and change in bowel movements  History of Present Illness: Sergio [name] is a 45 y.o. male with past medical history of GERD, hyperlipidemia, IBS-M, migraine, obesity, OSA, asthma, hypertension, who presents for evaluation of abdominal pain, bloating and change in bowel movements.  Patient reports longstanding history of gastrointestinal complaints for at least the last 20 years.  He states that has presented recurrent episodes of pain in his lower abdomen along with recurrent episodes of bloating in his abdomen.  The symptoms have been intermittent in nature but have been present for multiple years.  He noticed that eating raw onions and vegetables, as well as ice cream cause significant bloating.  He tries to avoid eating this type of food but does not follow any specific type of diet although he is currently on weight watchers.  He was recently prescribed Bentyl 10 mg as needed, which help with the pain but he only takes it when he has significant cramping.  He usually has 1 soft BM every day but 2-3 times a month he has urgency and has to have a BM. He very seldom has constipation, which lasts for a day and happens 2-3 times a month. He has urgency that has led to fecal soiling but no incontinence. He has nausea frequently but does not vomit.  The patient denies having any nausea, vomiting, fever, chills, hematochezia, melena, hematemesis, jaundice, pruritus or weight loss.  Most recent blood work-up on 04/01/2021 showed although cell count of 12.5, hemoglobin of 16.8, platelets of 320, CMP was within normal limits.  Notably, the patient was seen at [facility] in 2005 due to  similar symptoms.  He underwent an EGD as described below.  He was prescribed Librax but he does not remember taking the medication in the past.  Last EGD: 2005 by Dr. Henrene Pastor, presence of mild reflux esophagitis, has small hiatal hernia with rest of the examination within normal limits.  He was prescribed Librax for management of abdominal pain. Last Colonoscopy:More than 20 years ago, normal per patient but no records available  FHx: neg for any gastrointestinal/liver disease, grandmother had a GI cancer (unknown type) Social: neg smoking, alcohol or illicit drug use Surgical: no abdominal surgeries  Past Medical History: Past Medical History:  Diagnosis Date   Asthma with allergic rhinitis 03/11/2008   Essential hypertension 05/06/2012   Gastroesophageal reflux disease with hiatal hernia 05/06/2012   Gastroesophageal reflux disease with hiatal hernia    Hyperlipidemia LDL goal < 160 03/11/2008   Irritable bowel syndrome with constipation and diarrhea 05/06/2012   Migraine    Obesity (BMI 30.0-34.9) 03/03/2013   Obstructive sleep apnea 11/19/2008   Nocturnal polysomnography 12/14/2008: AHI 20.8, O2 Sat nadir 84%, optimal CPAP 12 cm H2O    Urticaria     Past Surgical History: Past Surgical History:  Procedure Laterality Date   URETHRAL DILATION      Family History: Family History  Problem Relation Age of Onset   Early death Father        Accident   Hypertension Maternal Grandmother    Vaginal cancer Maternal Grandmother    Parkinson's disease Maternal Grandmother    Diabetes Paternal Grandfather    ALS Paternal Grandfather    Breast  cancer Paternal Grandmother    Kidney cancer Paternal 85    Healthy Mother    Healthy Sister    Healthy Sister    Hypertension Maternal Uncle    Diabetes Paternal Aunt    COPD Maternal Grandfather    Urticaria Neg Hx    Immunodeficiency Neg Hx    Eczema Neg Hx    Atopy Neg Hx    Angioedema Neg Hx    Allergic rhinitis Neg Hx    Asthma  Neg Hx     Social History: Social History   Tobacco Use  Smoking Status Never  Smokeless Tobacco Never   Social History   Substance and Sexual Activity  Alcohol Use Yes   Comment: occ   Social History   Substance and Sexual Activity  Drug Use No    Allergies: Allergies  Allergen Reactions   Other Hives    Tree nuts       11/01/19 patient states he just found out he is no longer allergic to this.    Medications: Current Outpatient Medications  Medication Sig Dispense Refill   Azelastine HCl 137 MCG/SPRAY SOLN INSTILL 2 SPRAYS INTO BOTH NOSTRILS TWICE DAILY 30 mL 5   baclofen (LIORESAL) 10 MG tablet Take 1 tablet by mouth twice daily as needed. Limit 1-2 treatments per week 20 tablet 1   budesonide-formoterol (SYMBICORT) 160-4.5 MCG/ACT inhaler INHALE 2 PUFFS INTO THE LUNGS TWO TIMES DAILY 10.2 g 5   busPIRone (BUSPAR) 5 MG tablet TAKE 1 TABLET BY MOUTH 3 TIMES DAILY (NEEDS APPT) (Patient taking differently: Take 5 mg by mouth. prn) 90 tablet 0   Cholecalciferol (VITAMIN D3) 125 MCG (5000 UT) TABS Take by mouth daily.     diclofenac (VOLTAREN) 50 MG EC tablet TAKE 1 TABLET BY MOUTH TWO TIMES DAILY FOR 3 DAYS 6 tablet 0   EPINEPHrine 0.3 mg/0.3 mL IJ SOAJ injection Inject 0.3 mLs (0.3 mg total) into the muscle as needed for anaphylaxis. 2 each 1   levalbuterol (XOPENEX HFA) 45 MCG/ACT inhaler 4 puffs every 4-6 hours as needed. 15 g 1   losartan-hydrochlorothiazide (HYZAAR) 100-25 MG tablet TAKE 1 TABLET BY MOUTH ONCE A DAY 90 tablet 0   montelukast (SINGULAIR) 10 MG tablet TAKE 1 TABLET BY MOUTH ONCE A DAY 90 tablet 0   pantoprazole (PROTONIX) 40 MG tablet TAKE 1 TABLET (40 MG TOTAL) BY MOUTH DAILY. 90 tablet 0   simvastatin (ZOCOR) 40 MG tablet TAKE 1 TABLET BY MOUTH AT BEDTIME 90 tablet 0   dicyclomine (BENTYL) 10 MG capsule Take 1 capsule (10 mg total) by mouth 3 (three) times daily before meals. 90 capsule 2   thyroid (NP THYROID) 90 MG tablet Take 1 tablet (90 mg total)  by mouth daily. 90 tablet 0   zonisamide (ZONEGRAN) 100 MG capsule Take 1 capsule by mouth daily as directed 30 capsule 2   zonisamide (ZONEGRAN) 100 MG capsule Take 1 capsule (100 mg total) by mouth daily. 30 capsule 2   No current facility-administered medications for this visit.    Review of Systems: GENERAL: negative for malaise, night sweats HEENT: No changes in hearing or vision, no nose bleeds or other nasal problems. NECK: Negative for lumps, goiter, pain and significant neck swelling RESPIRATORY: Negative for cough, wheezing CARDIOVASCULAR: Negative for chest pain, leg swelling, palpitations, orthopnea GI: SEE HPI MUSCULOSKELETAL: Negative for joint pain or swelling, back pain, and muscle pain. SKIN: Negative for lesions, rash PSYCH: Negative for sleep disturbance, mood disorder and  recent psychosocial stressors. HEMATOLOGY Negative for prolonged bleeding, bruising easily, and swollen nodes. ENDOCRINE: Negative for cold or heat intolerance, polyuria, polydipsia and goiter. NEURO: negative for tremor, gait imbalance, syncope and seizures. The remainder of the review of systems is noncontributory.   Physical Exam: BP 135/90 (BP Location: Left Arm, Patient Position: Sitting, Cuff Size: Large)   Pulse 91   Temp 97.8 F (36.6 C) (Oral)   Ht 5\' 10"  (1.778 m)   Wt 231 lb 6.4 oz (105 kg)   BMI 33.20 kg/m  GENERAL: The patient is AO x3, in no acute distress. HEENT: Head is normocephalic and atraumatic. EOMI are intact. Mouth is well hydrated and without lesions. NECK: Supple. No masses LUNGS: Clear to auscultation. No presence of rhonchi/wheezing/rales. Adequate chest expansion HEART: RRR, normal s1 and s2. ABDOMEN: mildly tender upon palpation of the RLQ and R flank, no guarding, no peritoneal signs, and nondistended. BS +. No masses. EXTREMITIES: Without any cyanosis, clubbing, rash, lesions or edema. NEUROLOGIC: AOx3, no focal motor deficit. SKIN: no jaundice, no  rashes   Imaging/Labs: as above  I personally reviewed and interpreted the available labs, imaging and endoscopic files.  Impression and Plan: Sergio [name] is a 45 y.o. male with past medical history of GERD, hyperlipidemia, IBS-M, migraine, obesity, OSA, asthma, hypertension, who presents for evaluation of abdominal pain, bloating and change in bowel movements.  The patient has presented chronic symptoms without presence of red flag signs which very consistent with irritable bowel syndrome, mixed type.  I did consider that he would benefit from implementing a low FODMAP diet to improve his bloating episodes, but you can also take some IBgard as needed if he is still presenting significant bloating.  As he has presented some improvement in his abdominal pain, he can take the dicyclomine as needed for now.  We will check celiac serologies today.  Finally, the patient is due for colorectal cancer screening, we will set up a colonoscopy.  - Schedule colonoscopy - Explained presumed etiology of IBS symptoms. Patient was counseled about the benefit of implementing a low FODMAP to improve symptoms and recurrent episodes. A dietary list was provided to the patient. Also, the patient was counseled about the benefit of avoiding stressing situations and potential environmental triggers leading to symptomatology. - Check celiac serology - Start IBGard 1 tablet every 8-12 hours as needed - Can take Bentyl every 12 hours if abdominal cramping or pain  All questions were answered.      Sergio Peppers, MD Gastroenterology and Hepatology [facility]

## 2021-07-10 NOTE — Progress Notes (Signed)
Sergio [name], M.D. Gastroenterology & Hepatology [facility] [address] [place], [place] [address] Primary Care Physician: Noreene Larsson, NP [address]  [place] [address] [place] [address]  Referring MD: PCP  Chief Complaint: Abdominal pain, bloating and change in bowel movements  History of Present Illness: Sergio [name] is a 45 y.o. male with past medical history of GERD, hyperlipidemia, IBS-M, migraine, obesity, OSA, asthma, hypertension, who presents for evaluation of abdominal pain, bloating and change in bowel movements.  Patient reports longstanding history of gastrointestinal complaints for at least the last 20 years.  He states that has presented recurrent episodes of pain in his lower abdomen along with recurrent episodes of bloating in his abdomen.  The symptoms have been intermittent in nature but have been present for multiple years.  He noticed that eating raw onions and vegetables, as well as ice cream cause significant bloating.  He tries to avoid eating this type of food but does not follow any specific type of diet although he is currently on weight watchers.  He was recently prescribed Bentyl 10 mg as needed, which help with the pain but he only takes it when he has significant cramping.  He usually has 1 soft BM every day but 2-3 times a month he has urgency and has to have a BM. He very seldom has constipation, which lasts for a day and happens 2-3 times a month. He has urgency that has led to fecal soiling but no incontinence. He has nausea frequently but does not vomit.  The patient denies having any nausea, vomiting, fever, chills, hematochezia, melena, hematemesis, jaundice, pruritus or weight loss.  Most recent blood work-up on 04/01/2021 showed although cell count of 12.5, hemoglobin of 16.8, platelets of 320, CMP was within normal limits.  Notably, the patient was seen at [facility] in 2005 due to  similar symptoms.  He underwent an EGD as described below.  He was prescribed Librax but he does not remember taking the medication in the past.  Last EGD: 2005 by Dr. Henrene Pastor, presence of mild reflux esophagitis, has small hiatal hernia with rest of the examination within normal limits.  He was prescribed Librax for management of abdominal pain. Last Colonoscopy:More than 20 years ago, normal per patient but no records available  FHx: neg for any gastrointestinal/liver disease, grandmother had a GI cancer (unknown type) Social: neg smoking, alcohol or illicit drug use Surgical: no abdominal surgeries  Past Medical History: Past Medical History:  Diagnosis Date   Asthma with allergic rhinitis 03/11/2008   Essential hypertension 05/06/2012   Gastroesophageal reflux disease with hiatal hernia 05/06/2012   Gastroesophageal reflux disease with hiatal hernia    Hyperlipidemia LDL goal < 160 03/11/2008   Irritable bowel syndrome with constipation and diarrhea 05/06/2012   Migraine    Obesity (BMI 30.0-34.9) 03/03/2013   Obstructive sleep apnea 11/19/2008   Nocturnal polysomnography 12/14/2008: AHI 20.8, O2 Sat nadir 84%, optimal CPAP 12 cm H2O    Urticaria     Past Surgical History: Past Surgical History:  Procedure Laterality Date   URETHRAL DILATION      Family History: Family History  Problem Relation Age of Onset   Early death Father        Accident   Hypertension Maternal Grandmother    Vaginal cancer Maternal Grandmother    Parkinson's disease Maternal Grandmother    Diabetes Paternal Grandfather    ALS Paternal Grandfather    Breast  cancer Paternal Grandmother    Kidney cancer Paternal 3    Healthy Mother    Healthy Sister    Healthy Sister    Hypertension Maternal Uncle    Diabetes Paternal Aunt    COPD Maternal Grandfather    Urticaria Neg Hx    Immunodeficiency Neg Hx    Eczema Neg Hx    Atopy Neg Hx    Angioedema Neg Hx    Allergic rhinitis Neg Hx    Asthma  Neg Hx     Social History: Social History   Tobacco Use  Smoking Status Never  Smokeless Tobacco Never   Social History   Substance and Sexual Activity  Alcohol Use Yes   Comment: occ   Social History   Substance and Sexual Activity  Drug Use No    Allergies: Allergies  Allergen Reactions   Other Hives    Tree nuts       11/01/19 patient states he just found out he is no longer allergic to this.    Medications: Current Outpatient Medications  Medication Sig Dispense Refill   Azelastine HCl 137 MCG/SPRAY SOLN INSTILL 2 SPRAYS INTO BOTH NOSTRILS TWICE DAILY 30 mL 5   baclofen (LIORESAL) 10 MG tablet Take 1 tablet by mouth twice daily as needed. Limit 1-2 treatments per week 20 tablet 1   budesonide-formoterol (SYMBICORT) 160-4.5 MCG/ACT inhaler INHALE 2 PUFFS INTO THE LUNGS TWO TIMES DAILY 10.2 g 5   busPIRone (BUSPAR) 5 MG tablet TAKE 1 TABLET BY MOUTH 3 TIMES DAILY (NEEDS APPT) (Patient taking differently: Take 5 mg by mouth. prn) 90 tablet 0   Cholecalciferol (VITAMIN D3) 125 MCG (5000 UT) TABS Take by mouth daily.     diclofenac (VOLTAREN) 50 MG EC tablet TAKE 1 TABLET BY MOUTH TWO TIMES DAILY FOR 3 DAYS 6 tablet 0   EPINEPHrine 0.3 mg/0.3 mL IJ SOAJ injection Inject 0.3 mLs (0.3 mg total) into the muscle as needed for anaphylaxis. 2 each 1   levalbuterol (XOPENEX HFA) 45 MCG/ACT inhaler 4 puffs every 4-6 hours as needed. 15 g 1   losartan-hydrochlorothiazide (HYZAAR) 100-25 MG tablet TAKE 1 TABLET BY MOUTH ONCE A DAY 90 tablet 0   montelukast (SINGULAIR) 10 MG tablet TAKE 1 TABLET BY MOUTH ONCE A DAY 90 tablet 0   pantoprazole (PROTONIX) 40 MG tablet TAKE 1 TABLET (40 MG TOTAL) BY MOUTH DAILY. 90 tablet 0   simvastatin (ZOCOR) 40 MG tablet TAKE 1 TABLET BY MOUTH AT BEDTIME 90 tablet 0   dicyclomine (BENTYL) 10 MG capsule Take 1 capsule (10 mg total) by mouth 3 (three) times daily before meals. 90 capsule 2   thyroid (NP THYROID) 90 MG tablet Take 1 tablet (90 mg total)  by mouth daily. 90 tablet 0   zonisamide (ZONEGRAN) 100 MG capsule Take 1 capsule by mouth daily as directed 30 capsule 2   zonisamide (ZONEGRAN) 100 MG capsule Take 1 capsule (100 mg total) by mouth daily. 30 capsule 2   No current facility-administered medications for this visit.    Review of Systems: GENERAL: negative for malaise, night sweats HEENT: No changes in hearing or vision, no nose bleeds or other nasal problems. NECK: Negative for lumps, goiter, pain and significant neck swelling RESPIRATORY: Negative for cough, wheezing CARDIOVASCULAR: Negative for chest pain, leg swelling, palpitations, orthopnea GI: SEE HPI MUSCULOSKELETAL: Negative for joint pain or swelling, back pain, and muscle pain. SKIN: Negative for lesions, rash PSYCH: Negative for sleep disturbance, mood disorder and  recent psychosocial stressors. HEMATOLOGY Negative for prolonged bleeding, bruising easily, and swollen nodes. ENDOCRINE: Negative for cold or heat intolerance, polyuria, polydipsia and goiter. NEURO: negative for tremor, gait imbalance, syncope and seizures. The remainder of the review of systems is noncontributory.   Physical Exam: BP 135/90 (BP Location: Left Arm, Patient Position: Sitting, Cuff Size: Large)   Pulse 91   Temp 97.8 F (36.6 C) (Oral)   Ht 5\' 10"  (1.778 m)   Wt 231 lb 6.4 oz (105 kg)   BMI 33.20 kg/m  GENERAL: The patient is AO x3, in no acute distress. HEENT: Head is normocephalic and atraumatic. EOMI are intact. Mouth is well hydrated and without lesions. NECK: Supple. No masses LUNGS: Clear to auscultation. No presence of rhonchi/wheezing/rales. Adequate chest expansion HEART: RRR, normal s1 and s2. ABDOMEN: mildly tender upon palpation of the RLQ and R flank, no guarding, no peritoneal signs, and nondistended. BS +. No masses. EXTREMITIES: Without any cyanosis, clubbing, rash, lesions or edema. NEUROLOGIC: AOx3, no focal motor deficit. SKIN: no jaundice, no  rashes   Imaging/Labs: as above  I personally reviewed and interpreted the available labs, imaging and endoscopic files.  Impression and Plan: Sergio [name] is a 45 y.o. male with past medical history of GERD, hyperlipidemia, IBS-M, migraine, obesity, OSA, asthma, hypertension, who presents for evaluation of abdominal pain, bloating and change in bowel movements.  The patient has presented chronic symptoms without presence of red flag signs which very consistent with irritable bowel syndrome, mixed type.  I did consider that he would benefit from implementing a low FODMAP diet to improve his bloating episodes, but you can also take some IBgard as needed if he is still presenting significant bloating.  As he has presented some improvement in his abdominal pain, he can take the dicyclomine as needed for now.  We will check celiac serologies today.  Finally, the patient is due for colorectal cancer screening, we will set up a colonoscopy.  - Schedule colonoscopy - Explained presumed etiology of IBS symptoms. Patient was counseled about the benefit of implementing a low FODMAP to improve symptoms and recurrent episodes. A dietary list was provided to the patient. Also, the patient was counseled about the benefit of avoiding stressing situations and potential environmental triggers leading to symptomatology. - Check celiac serology - Start IBGard 1 tablet every 8-12 hours as needed - Can take Bentyl every 12 hours if abdominal cramping or pain  All questions were answered.      Sergio Peppers, MD Gastroenterology and Hepatology [facility]

## 2021-07-10 NOTE — Patient Instructions (Signed)
Schedule colonoscopy Explained presumed etiology of IBS symptoms. Patient was counseled about the benefit of implementing a low FODMAP to improve symptoms and recurrent episodes. A dietary list was provided to the patient. Also, the patient was counseled about the benefit of avoiding stressing situations and potential environmental triggers leading to symptomatology. Perform blood workup Start IBGard 1 tablet every 8-12 hours as needed Can take Bentyl every 12 hours if abdominal cramping or pain

## 2021-07-11 DIAGNOSIS — K582 Mixed irritable bowel syndrome: Secondary | ICD-10-CM | POA: Diagnosis not present

## 2021-07-14 ENCOUNTER — Other Ambulatory Visit (INDEPENDENT_AMBULATORY_CARE_PROVIDER_SITE_OTHER): Payer: Self-pay

## 2021-07-14 LAB — CELIAC DISEASE PANEL
(tTG) Ab, IgA: <1 U/mL
(tTG) Ab, IgG: <1 U/mL
Gliadin IgA: <1 U/mL
Gliadin IgG: <1 U/mL
Immunoglobulin A: 209 mg/dL (ref 47–310)

## 2021-07-16 ENCOUNTER — Other Ambulatory Visit (HOSPITAL_COMMUNITY): Payer: Self-pay

## 2021-07-29 ENCOUNTER — Other Ambulatory Visit (INDEPENDENT_AMBULATORY_CARE_PROVIDER_SITE_OTHER): Payer: Self-pay

## 2021-07-29 DIAGNOSIS — I10 Essential (primary) hypertension: Secondary | ICD-10-CM

## 2021-07-31 ENCOUNTER — Ambulatory Visit: Payer: 59 | Admitting: Nurse Practitioner

## 2021-08-01 ENCOUNTER — Ambulatory Visit: Payer: Self-pay

## 2021-08-01 ENCOUNTER — Other Ambulatory Visit: Payer: Self-pay

## 2021-08-01 ENCOUNTER — Ambulatory Visit
Admission: EM | Admit: 2021-08-01 | Discharge: 2021-08-01 | Disposition: A | Discharge status: Home / Self Care | Payer: 59 | Attending: Family Medicine | Admitting: Family Medicine

## 2021-08-01 DIAGNOSIS — E039 Hypothyroidism, unspecified: Secondary | ICD-10-CM | POA: Diagnosis not present

## 2021-08-01 DIAGNOSIS — K219 Gastro-esophageal reflux disease without esophagitis: Secondary | ICD-10-CM | POA: Diagnosis not present

## 2021-08-01 DIAGNOSIS — E782 Mixed hyperlipidemia: Secondary | ICD-10-CM

## 2021-08-01 DIAGNOSIS — J4520 Mild intermittent asthma, uncomplicated: Secondary | ICD-10-CM

## 2021-08-01 DIAGNOSIS — E785 Hyperlipidemia, unspecified: Secondary | ICD-10-CM | POA: Diagnosis not present

## 2021-08-01 DIAGNOSIS — J3089 Other allergic rhinitis: Secondary | ICD-10-CM | POA: Diagnosis not present

## 2021-08-01 DIAGNOSIS — I10 Essential (primary) hypertension: Secondary | ICD-10-CM

## 2021-08-01 DIAGNOSIS — R5383 Other fatigue: Secondary | ICD-10-CM

## 2021-08-01 MED ORDER — PANTOPRAZOLE SODIUM 40 MG PO TBEC
40.0000 mg | DELAYED_RELEASE_TABLET | Freq: Every day | ORAL | 0 refills | Status: DC
  Filled 2021-08-01: qty 90, 90d supply, fill #0

## 2021-08-01 MED ORDER — LOSARTAN POTASSIUM-HCTZ 100-25 MG PO TABS
1.0000 | ORAL_TABLET | Freq: Every day | ORAL | 0 refills | Status: DC
  Filled 2021-08-01: qty 90, 90d supply, fill #0

## 2021-08-01 MED ORDER — THYROID 90 MG PO TABS
90.0000 mg | ORAL_TABLET | Freq: Every day | ORAL | 0 refills | Status: DC
  Filled 2021-08-01: qty 90, 90d supply, fill #0

## 2021-08-01 MED ORDER — SIMVASTATIN 40 MG PO TABS
40.0000 mg | ORAL_TABLET | Freq: Every day | ORAL | 0 refills | Status: DC
  Filled 2021-08-01: qty 90, 90d supply, fill #0

## 2021-08-01 MED ORDER — MONTELUKAST SODIUM 10 MG PO TABS
10.0000 mg | ORAL_TABLET | Freq: Every day | ORAL | 0 refills | Status: DC
  Filled 2021-08-01: qty 90, 90d supply, fill #0

## 2021-08-01 NOTE — ED Provider Notes (Signed)
[facility]    CSN: [id number] Arrival date & time: 08/01/21  1737      History   Chief Complaint Chief Complaint  Patient presents with   Medication Refill    HPI Sergio [name] is a 45 y.o. male.   Patient presenting today requesting refills on multiple of his chronic medications.  He states he was supposed to have an appoint with his primary care provider yesterday but the office canceled the appointment and booked him out to January, asking him to go to urgent care for refills.  He states he tolerates all of his medications well without side effects and is compliant with regimen.  Denies any new concerns or symptoms.   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   Moderate persistent asthma, uncomplicated 22/29/7989   Seasonal and perennial allergic rhinitis 05/23/2019   Migraine 10/19/2018   Healthcare maintenance 05/13/2015   Pain of left thumb 04/12/2015   Obesity (BMI 30.0-34.9) 03/03/2013   Gastroesophageal reflux disease with hiatal hernia 05/06/2012   Essential hypertension 05/06/2012   Irritable bowel syndrome with constipation and diarrhea 05/06/2012   Obstructive sleep apnea 11/19/2008   Hyperlipidemia 03/11/2008   Asthma with allergic rhinitis 03/11/2008    Past Surgical History:  Procedure Laterality Date   URETHRAL DILATION         Home  Medications    Prior to Admission medications   Medication Sig Start Date End Date Taking? Authorizing Provider  Azelastine HCl 137 MCG/SPRAY SOLN INSTILL 2 SPRAYS INTO BOTH NOSTRILS TWICE DAILY Patient taking differently: Place 2 sprays into both nostrils daily. 10/02/20 10/02/21  Valentina Shaggy, MD  baclofen (LIORESAL) 10 MG tablet Take 1 tablet by mouth twice daily as needed. Limit 1-2 treatments per week Patient taking differently: Take 10 mg by mouth 2 (two) times daily as needed (migraines). 04/01/21     budesonide-formoterol (SYMBICORT) 160-4.5 MCG/ACT inhaler INHALE 2 PUFFS INTO THE LUNGS TWO TIMES DAILY 07/31/20 07/31/21  Valentina Shaggy, MD  busPIRone (BUSPAR) 5 MG tablet TAKE 1 TABLET BY MOUTH 3 TIMES DAILY (NEEDS APPT) Patient taking differently: Take 5 mg by mouth 3 (three) times daily as needed (anxiety). 03/18/21 03/18/22  Ailene Ards, NP  cetirizine (ZYRTEC) 10 MG tablet Take 10 mg by mouth daily.    [provider]  Cholecalciferol (VITAMIN D3) 125 MCG (5000 UT) TABS Take 5,000 Units by mouth daily.    [provider]  dicyclomine (BENTYL) 10 MG capsule Take 1 capsule (10 mg total) by mouth 3 (three) times daily before meals. Patient taking differently: Take 10 mg by mouth 3 (three) times daily as needed (cramps). 04/01/21   Ailene Ards, NP  diphenhydrAMINE (BENADRYL) 25 MG tablet Take 50 mg by mouth daily as needed for itching.    [provider]  EPINEPHrine 0.3 mg/0.3 mL IJ SOAJ injection Inject  0.3 mLs (0.3 mg total) into the muscle as needed for anaphylaxis. 05/19/19   Valentina Shaggy, MD  levalbuterol [facility] HFA) 45 MCG/ACT inhaler 4 puffs every 4-6 hours as needed. 05/19/19   Valentina Shaggy, MD  losartan-hydrochlorothiazide (HYZAAR) 100-25 MG tablet Take 1 tablet by mouth daily. 08/01/21   Volney American, PA-C  montelukast (SINGULAIR) 10 MG tablet Take 1 tablet (10 mg total) by mouth daily. 08/01/21   Volney American, PA-C  pantoprazole (PROTONIX) 40 MG tablet Take 1 tablet (40 mg total) by mouth daily. 08/01/21   Volney American, PA-C  simvastatin (ZOCOR) 40 MG tablet Take 1 tablet (40 mg total) by mouth at bedtime. 08/01/21   Volney American, PA-C  thyroid (NP THYROID) 90 MG tablet Take 1 tablet (90 mg total) by mouth daily. 08/01/21   Volney American, PA-C  zonisamide (ZONEGRAN) 100 MG capsule Take 1 capsule (100 mg total) by mouth daily. 07/07/21       Family History Family History  Problem Relation Age of Onset   Early death Father        Accident   Hypertension Maternal Grandmother    Vaginal cancer Maternal Grandmother    Parkinson's disease Maternal Grandmother    Diabetes Paternal Grandfather    ALS Paternal Grandfather    Breast cancer Paternal Grandmother    Kidney cancer Paternal Grandmother    Healthy Mother    Healthy Sister    Healthy Sister    Hypertension Maternal Uncle    Diabetes Paternal Aunt    COPD Maternal Grandfather    Urticaria Neg Hx    Immunodeficiency Neg Hx    Eczema Neg Hx    Atopy Neg Hx    Angioedema Neg Hx    Allergic rhinitis Neg Hx    Asthma Neg Hx     Social History Social History   Tobacco Use   Smoking status: Never   Smokeless tobacco: Never  Vaping Use   Vaping Use: Never used  Substance Use Topics   Alcohol use: Yes    Comment: occ   Drug use: No     Allergies   Other   Review of Systems Review of Systems Per HPI  Physical Exam Triage Vital Signs ED Triage Vitals  Enc Vitals Group     BP 08/01/21 1803 108/75     Pulse Rate 08/01/21 1803 87     Resp 08/01/21 1803 14     Temp 08/01/21 1803 98.9 F (37.2 C)     Temp Source 08/01/21 1803 Oral     SpO2 08/01/21 1803 98 %     Weight --      Height --      Head Circumference --      Peak Flow --      Pain Score 08/01/21 1804 0     Pain Loc --      Pain Edu? --      Excl. in [place]? --    No data found.  Updated Vital Signs BP 108/75 (BP  Location: Right Arm)   Pulse 87   Temp 98.9 F (37.2 C) (Oral)   Resp 14   SpO2 98%   Visual Acuity Right Eye Distance:   Left Eye Distance:   Bilateral Distance:    Right Eye Near:   Left Eye Near:    Bilateral Near:     Physical Exam Vitals and nursing note reviewed.  Constitutional:  Appearance: Normal appearance.  HENT:     Head: Atraumatic.     Mouth/Throat:     Mouth: Mucous membranes are moist.  Eyes:     Extraocular Movements: Extraocular movements intact.     Conjunctiva/sclera: Conjunctivae normal.  Cardiovascular:     Rate and Rhythm: Normal rate and regular rhythm.  Pulmonary:     Effort: Pulmonary effort is normal.     Breath sounds: Normal breath sounds.  Musculoskeletal:        General: Normal range of motion.     Cervical back: Normal range of motion and neck supple.  Skin:    General: Skin is warm and dry.  Neurological:     General: No focal deficit present.     Mental Status: He is oriented to person, place, and time.  Psychiatric:        Mood and Affect: Mood normal.        Thought Content: Thought content normal.        Judgment: Judgment normal.     UC Treatments / Results  Labs (all labs ordered are listed, but only abnormal results are displayed) Labs Reviewed - No data to display  EKG   Radiology No results found.  Procedures Procedures (including critical care time)  Medications Ordered in UC Medications - No data to display  Initial Impression / Assessment and Plan / UC Course  I have reviewed the triage vital signs and the nursing notes.  Pertinent labs & imaging results that were available during my care of the patient were reviewed by me and considered in my medical decision making (see chart for details).     Vitals and exam reassuring and benign, up-to-date on labs which were appropriate per chart review.  Will refill the medications that he is requesting to bridge him until his appointment in January.  Final  Clinical Impressions(s) / UC Diagnoses   Final diagnoses:  Hyperlipidemia, unspecified hyperlipidemia type  Essential hypertension  Gastroesophageal reflux disease, unspecified whether esophagitis present  Hypothyroidism, unspecified type  Seasonal allergic rhinitis due to other allergic trigger   Discharge Instructions   None    ED Prescriptions     Medication Sig Dispense Auth. Provider   losartan-hydrochlorothiazide (HYZAAR) 100-25 MG tablet Take 1 tablet by mouth daily. 90 tablet Merrie Roof [place], [place]   montelukast (SINGULAIR) 10 MG tablet Take 1 tablet (10 mg total) by mouth daily. 90 tablet Volney American, [place]   pantoprazole (PROTONIX) 40 MG tablet Take 1 tablet (40 mg total) by mouth daily. 90 tablet Volney American, [place]   simvastatin (ZOCOR) 40 MG tablet Take 1 tablet (40 mg total) by mouth at bedtime. 90 tablet Volney American, [place]   thyroid (NP THYROID) 90 MG tablet Take 1 tablet (90 mg total) by mouth daily. 90 tablet Volney American, [place]      PDMP not reviewed this encounter.   Volney American, [place] 08/01/21 1949

## 2021-08-01 NOTE — ED Triage Notes (Signed)
Pt presents needing medication refill for singulair 10mg , protonix 40mg , hyzaar 100-25mg , and simvastatin 40mg , and NP thyroid 90mg .

## 2021-08-02 ENCOUNTER — Other Ambulatory Visit (HOSPITAL_COMMUNITY): Payer: Self-pay

## 2021-08-04 ENCOUNTER — Other Ambulatory Visit (HOSPITAL_COMMUNITY)
Admission: RE | Admit: 2021-08-04 | Discharge: 2021-08-04 | Disposition: A | Discharge status: Home / Self Care | Payer: 59 | Source: Ambulatory Visit | Attending: Gastroenterology | Admitting: Gastroenterology

## 2021-08-04 ENCOUNTER — Ambulatory Visit (INDEPENDENT_AMBULATORY_CARE_PROVIDER_SITE_OTHER): Payer: 59 | Admitting: Internal Medicine

## 2021-08-04 DIAGNOSIS — I10 Essential (primary) hypertension: Secondary | ICD-10-CM | POA: Insufficient documentation

## 2021-08-04 LAB — BASIC METABOLIC PANEL
Anion gap: 7 (ref 5–15)
BUN: 15 mg/dL (ref 6–20)
CO2: 27 mmol/L (ref 22–32)
Calcium: 8.8 mg/dL — ABNORMAL LOW (ref 8.9–10.3)
Chloride: 105 mmol/L (ref 98–111)
Creatinine, Ser: 1.15 mg/dL (ref 0.61–1.24)
GFR, Estimated: >60 mL/min (ref >60)
Glucose, Bld: 111 mg/dL — ABNORMAL HIGH (ref 70–99)
Potassium: 3.9 mmol/L (ref 3.5–5.1)
Sodium: 139 mmol/L (ref 135–145)

## 2021-08-06 ENCOUNTER — Other Ambulatory Visit: Payer: Self-pay

## 2021-08-06 ENCOUNTER — Encounter (HOSPITAL_COMMUNITY)
Admission: RE | Disposition: A | Discharge status: Home / Self Care | Payer: Self-pay | Source: Home / Self Care | Attending: Gastroenterology

## 2021-08-06 ENCOUNTER — Ambulatory Visit (HOSPITAL_COMMUNITY): Payer: 59 | Admitting: Anesthesiology

## 2021-08-06 ENCOUNTER — Encounter (HOSPITAL_COMMUNITY): Payer: Self-pay | Admitting: Gastroenterology

## 2021-08-06 ENCOUNTER — Ambulatory Visit (HOSPITAL_COMMUNITY)
Admission: RE | Admit: 2021-08-06 | Discharge: 2021-08-06 | Disposition: A | Discharge status: Home / Self Care | Payer: 59 | Attending: Gastroenterology | Admitting: Gastroenterology

## 2021-08-06 DIAGNOSIS — G4733 Obstructive sleep apnea (adult) (pediatric): Secondary | ICD-10-CM | POA: Diagnosis not present

## 2021-08-06 DIAGNOSIS — D120 Benign neoplasm of cecum: Secondary | ICD-10-CM | POA: Insufficient documentation

## 2021-08-06 DIAGNOSIS — R1030 Lower abdominal pain, unspecified: Secondary | ICD-10-CM | POA: Diagnosis not present

## 2021-08-06 DIAGNOSIS — K5730 Diverticulosis of large intestine without perforation or abscess without bleeding: Secondary | ICD-10-CM | POA: Insufficient documentation

## 2021-08-06 DIAGNOSIS — Z6833 Body mass index (BMI) 33.0-33.9, adult: Secondary | ICD-10-CM | POA: Diagnosis not present

## 2021-08-06 DIAGNOSIS — R140 Abdominal distension (gaseous): Secondary | ICD-10-CM | POA: Diagnosis not present

## 2021-08-06 DIAGNOSIS — D122 Benign neoplasm of ascending colon: Secondary | ICD-10-CM | POA: Diagnosis not present

## 2021-08-06 DIAGNOSIS — K219 Gastro-esophageal reflux disease without esophagitis: Secondary | ICD-10-CM | POA: Diagnosis not present

## 2021-08-06 DIAGNOSIS — E669 Obesity, unspecified: Secondary | ICD-10-CM | POA: Insufficient documentation

## 2021-08-06 DIAGNOSIS — D126 Benign neoplasm of colon, unspecified: Secondary | ICD-10-CM | POA: Diagnosis not present

## 2021-08-06 DIAGNOSIS — E785 Hyperlipidemia, unspecified: Secondary | ICD-10-CM | POA: Insufficient documentation

## 2021-08-06 DIAGNOSIS — Z79899 Other long term (current) drug therapy: Secondary | ICD-10-CM | POA: Diagnosis not present

## 2021-08-06 DIAGNOSIS — K582 Mixed irritable bowel syndrome: Secondary | ICD-10-CM | POA: Diagnosis not present

## 2021-08-06 DIAGNOSIS — J45909 Unspecified asthma, uncomplicated: Secondary | ICD-10-CM | POA: Diagnosis not present

## 2021-08-06 DIAGNOSIS — K648 Other hemorrhoids: Secondary | ICD-10-CM | POA: Insufficient documentation

## 2021-08-06 DIAGNOSIS — E039 Hypothyroidism, unspecified: Secondary | ICD-10-CM | POA: Diagnosis not present

## 2021-08-06 DIAGNOSIS — I10 Essential (primary) hypertension: Secondary | ICD-10-CM | POA: Insufficient documentation

## 2021-08-06 DIAGNOSIS — Z1211 Encounter for screening for malignant neoplasm of colon: Secondary | ICD-10-CM | POA: Insufficient documentation

## 2021-08-06 DIAGNOSIS — K635 Polyp of colon: Secondary | ICD-10-CM | POA: Diagnosis not present

## 2021-08-06 HISTORY — PX: COLONOSCOPY WITH PROPOFOL: SHX5780

## 2021-08-06 HISTORY — PX: POLYPECTOMY: SHX5525

## 2021-08-06 LAB — HM COLONOSCOPY

## 2021-08-06 SURGERY — COLONOSCOPY WITH PROPOFOL
Anesthesia: General

## 2021-08-06 MED ORDER — LACTATED RINGERS IV SOLN: INTRAVENOUS | Status: DC

## 2021-08-06 MED ORDER — PROPOFOL 10 MG/ML IV BOLUS
INTRAVENOUS | Status: DC | PRN
  Administered 2021-08-06: 100 mg via INTRAVENOUS

## 2021-08-06 MED ORDER — LIDOCAINE HCL (CARDIAC) PF 100 MG/5ML IV SOSY
PREFILLED_SYRINGE | INTRAVENOUS | Status: DC | PRN
  Administered 2021-08-06: 50 mg via INTRAVENOUS

## 2021-08-06 MED ORDER — MIDAZOLAM HCL 2 MG/2ML IJ SOLN
INTRAMUSCULAR | Status: AC
  Filled 2021-08-06: qty 2

## 2021-08-06 MED ORDER — STERILE WATER FOR IRRIGATION IR SOLN
Status: DC | PRN
  Administered 2021-08-06: 120 mL

## 2021-08-06 MED ORDER — PROPOFOL 500 MG/50ML IV EMUL
INTRAVENOUS | Status: DC | PRN
  Administered 2021-08-06: 150 ug/kg/min via INTRAVENOUS

## 2021-08-06 NOTE — Anesthesia Postprocedure Evaluation (Signed)
Anesthesia Post Note  Patient: Sergio [name]  Procedure(s) Performed: COLONOSCOPY WITH PROPOFOL POLYPECTOMY  Patient location during evaluation: Endoscopy Anesthesia Type: General Anesthetic complications: no Comments: Nursing staff discharged the patient as per protocol.   No notable events documented.   Last Vitals:  Vitals:   08/06/21 0735 08/06/21 0937  BP: (!) 144/93 95/67  Resp: 16 18  Temp: 36.7 C 36.4 C  SpO2: 99% 97%    Last Pain:  Vitals:   08/06/21 0937  TempSrc: Oral  PainSc: 0-No pain                 Malayah Demuro C Whitaker Holderman

## 2021-08-06 NOTE — Interval H&P Note (Signed)
History and Physical Interval Note:  08/06/2021 7:34 AM  Sergio [name]  has presented today for surgery, with the diagnosis of GERD IBS with Diarrhea.  The various methods of treatment have been discussed with the patient and family. After consideration of risks, benefits and other options for treatment, the patient has consented to  Procedure(s) with comments: COLONOSCOPY WITH PROPOFOL (N/A) - 915 as a surgical intervention.  The patient's history has been reviewed, patient examined, no change in status, stable for surgery.  I have reviewed the patient's chart and labs.  Questions were answered to the patient's satisfaction.     [name] [place]

## 2021-08-06 NOTE — Anesthesia Preprocedure Evaluation (Signed)
Anesthesia Evaluation  Patient identified by MRN, date of birth, ID band Patient awake    Reviewed: Allergy & Precautions, H&P , NPO status , Patient's Chart, lab work & pertinent test results  Airway Mallampati: III  TM Distance: >3 FB Neck ROM: Full    Dental  (+) Dental Advisory Given Crowns :   Pulmonary asthma , sleep apnea ,    Pulmonary exam normal breath sounds clear to auscultation       Cardiovascular hypertension, Pt. on medications Normal cardiovascular exam Rhythm:Regular Rate:Normal     Neuro/Psych  Headaches,  Neuromuscular disease negative psych ROS   GI/Hepatic Neg liver ROS, GERD  Medicated and Controlled,  Endo/Other  Hypothyroidism   Renal/GU negative Renal ROS  negative genitourinary   Musculoskeletal negative musculoskeletal ROS (+)   Abdominal   Peds negative pediatric ROS (+)  Hematology negative hematology ROS (+)   Anesthesia Other Findings   Reproductive/Obstetrics negative OB ROS                             Anesthesia Physical Anesthesia Plan  ASA: 2  Anesthesia Plan: General   Post-op Pain Management: Minimal or no pain anticipated   Induction:   PONV Risk Score and Plan: TIVA  Airway Management Planned: Nasal Cannula and Natural Airway  Additional Equipment:   Intra-op Plan:   Post-operative Plan:   Informed Consent: I have reviewed the patients History and Physical, chart, labs and discussed the procedure including the risks, benefits and alternatives for the proposed anesthesia with the patient or authorized representative who has indicated his/her understanding and acceptance.     Dental advisory given  Plan Discussed with: CRNA and Surgeon  Anesthesia Plan Comments:         Anesthesia Quick Evaluation

## 2021-08-06 NOTE — Discharge Instructions (Addendum)
You are being discharged to home.  Resume your previous diet.  We are waiting for your pathology results.  Your physician has recommended a repeat colonoscopy for surveillance based on pathology results.  

## 2021-08-06 NOTE — Op Note (Signed)
[facility] Patient Name: Sergio [name] Procedure Date: 08/06/2021 8:58 AM MRN: [id number] Date of Birth: 11-Nov-1975 Attending MD: [name] ,  CSN: [id number] Age: 45 Admit Type: Outpatient Procedure:                Colonoscopy Indications:              Screening for colorectal malignant neoplasm Providers:                [name], Lurline Del, RN, Casimer Bilis, Technician Referring MD:              Medicines:                Monitored Anesthesia Care Complications:            No immediate complications. Estimated Blood Loss:     Estimated blood loss: none. Procedure:                Pre-Anesthesia Assessment:                           - Prior to the procedure, a History and Physical                            was performed, and patient medications, allergies                            and sensitivities were reviewed. The patient's                            tolerance of previous anesthesia was reviewed.                           - The risks and benefits of the procedure and the                            sedation options and risks were discussed with the                            patient. All questions were answered and informed                            consent was obtained.                           - ASA Grade Assessment: II - A patient with mild                            systemic disease.                           After obtaining informed consent, the colonoscope                            was passed under direct vision. Throughout the  procedure, the patient's blood pressure, pulse, and                            oxygen saturations were monitored continuously. The                            [id number]-HQ190L ([id number]) scope was introduced through                            the anus and advanced to the the cecum, identified                            by appendiceal orifice and ileocecal valve. The                             colonoscopy was performed without difficulty. The                            patient tolerated the procedure well. The quality                            of the bowel preparation was good. Scope In: 9:08:37 AM Scope Out: 9:33:24 AM Scope Withdrawal Time: 0 hours 13 minutes 55 seconds  Total Procedure Duration: 0 hours 24 minutes 47 seconds  Findings:      The perianal and digital rectal examinations were normal.      Two sessile polyps were found in the cecum. The polyps were 2 mm in       size. These polyps were removed with a cold biopsy forceps. Resection       and retrieval were complete.      Two sessile polyps were found in the ascending colon. The polyps were 5       to 10 mm in size. These polyps were removed with a cold snare. Resection       and retrieval were complete.      A few small-mouthed diverticula were found in the sigmoid colon.      Non-bleeding internal hemorrhoids were found during retroflexion. The       hemorrhoids were small. Impression:               - Two 2 mm polyps in the cecum, removed with a cold                            biopsy forceps. Resected and retrieved.                           - Two 5 to 10 mm polyps in the ascending colon,                            removed with a cold snare. Resected and retrieved.                           - Diverticulosis in the sigmoid colon.                           -  Non-bleeding internal hemorrhoids. Moderate Sedation:      Per Anesthesia Care Recommendation:           - Discharge patient to home (ambulatory).                           - Resume previous diet.                           - Await pathology results.                           - Repeat colonoscopy for surveillance based on                            pathology results. Procedure Code(s):        --- Professional ---                           [phone], Colonoscopy, flexible; with removal of                            tumor(s), polyp(s), or  other lesion(s) by snare                            technique                           45380, 19, Colonoscopy, flexible; with biopsy,                            single or multiple Diagnosis Code(s):        --- Professional ---                           Z12.11, Encounter for screening for malignant                            neoplasm of colon                           K63.5, Polyp of colon                           K64.8, Other hemorrhoids                           K57.30, Diverticulosis of large intestine without                            perforation or abscess without bleeding CPT copyright 2019 American Medical Association. All rights reserved. The codes documented in this report are preliminary and upon coder review may  be revised to meet current compliance requirements. [name], MD [name],  08/06/2021 9:39:35 AM This report has been signed electronically. Number of Addenda: 0

## 2021-08-06 NOTE — Transfer of Care (Signed)
Immediate Anesthesia Transfer of Care Note  Patient: Sergio [name]  Procedure(s) Performed: COLONOSCOPY WITH PROPOFOL POLYPECTOMY  Patient Location: Endoscopy Unit  Anesthesia Type:General  Level of Consciousness: awake and oriented  Airway & Oxygen Therapy: Patient Spontanous Breathing  Post-op Assessment: Report given to RN and Post -op Vital signs reviewed and stable  Post vital signs: Reviewed and stable  Last Vitals:  Vitals Value Taken Time  BP    Temp    Pulse    Resp    SpO2      Last Pain:  Vitals:   08/06/21 0735  TempSrc: Oral  PainSc:       Patients Stated Pain Goal: 7 (30/15/99 6895)  Complications: No notable events documented.

## 2021-08-06 NOTE — Anesthesia Procedure Notes (Signed)
Date/Time: 08/06/2021 9:10 AM Performed by: Orlie Dakin, CRNA Pre-anesthesia Checklist: Patient identified, Emergency Drugs available, Suction available and Patient being monitored Patient Re-evaluated:Patient Re-evaluated prior to induction Oxygen Delivery Method: Non-rebreather mask Induction Type: IV induction Placement Confirmation: positive ETCO2

## 2021-08-08 LAB — SURGICAL PATHOLOGY

## 2021-08-11 ENCOUNTER — Encounter (INDEPENDENT_AMBULATORY_CARE_PROVIDER_SITE_OTHER): Payer: Self-pay | Admitting: *Deleted

## 2021-08-12 ENCOUNTER — Encounter (HOSPITAL_COMMUNITY): Payer: Self-pay | Admitting: Gastroenterology

## 2021-08-12 NOTE — Progress Notes (Signed)
Patient had change of bowel movements due to his irritable bowel syndrome but this was not intended to be evaluated with a colonoscopy  Colonoscopy was meant for screening purposes.

## 2021-08-14 ENCOUNTER — Ambulatory Visit: Payer: 59 | Admitting: Allergy & Immunology

## 2021-08-19 ENCOUNTER — Encounter (INDEPENDENT_AMBULATORY_CARE_PROVIDER_SITE_OTHER): Payer: Self-pay | Admitting: Gastroenterology

## 2021-08-21 ENCOUNTER — Other Ambulatory Visit (HOSPITAL_COMMUNITY): Payer: Self-pay

## 2021-08-21 ENCOUNTER — Other Ambulatory Visit (INDEPENDENT_AMBULATORY_CARE_PROVIDER_SITE_OTHER): Payer: Self-pay | Admitting: Gastroenterology

## 2021-08-21 DIAGNOSIS — R109 Unspecified abdominal pain: Secondary | ICD-10-CM

## 2021-08-21 MED ORDER — HYOSCYAMINE SULFATE 0.125 MG PO TABS
0.1250 mg | ORAL_TABLET | Freq: Four times a day (QID) | ORAL | 3 refills | Status: AC | PRN
  Filled 2021-08-21: qty 30, 8d supply, fill #0
  Filled 2021-11-06: qty 30, 8d supply, fill #1

## 2021-08-21 NOTE — Progress Notes (Signed)
Hi Sergio [name],   Can you please schedule a CT abdomen pelvis with IV contrast? Dx: abdominal pain  Thanks,  [name], MD Gastroenterology and Hepatology [facility]

## 2021-10-01 ENCOUNTER — Ambulatory Visit (HOSPITAL_COMMUNITY)
Admission: RE | Admit: 2021-10-01 | Discharge: 2021-10-01 | Disposition: A | Discharge status: Home / Self Care | Payer: 59 | Source: Ambulatory Visit | Attending: Gastroenterology | Admitting: Gastroenterology

## 2021-10-01 ENCOUNTER — Encounter (HOSPITAL_COMMUNITY): Payer: Self-pay | Admitting: Radiology

## 2021-10-01 ENCOUNTER — Other Ambulatory Visit: Payer: Self-pay

## 2021-10-01 DIAGNOSIS — R109 Unspecified abdominal pain: Secondary | ICD-10-CM | POA: Insufficient documentation

## 2021-10-01 LAB — POCT I-STAT CREATININE: Creatinine, Ser: 1.2 mg/dL (ref 0.61–1.24)

## 2021-10-01 MED ORDER — IOHEXOL 300 MG/ML  SOLN
100.0000 mL | Freq: Once | INTRAMUSCULAR | Status: AC | PRN
  Administered 2021-10-01: 100 mL via INTRAVENOUS

## 2021-10-07 ENCOUNTER — Other Ambulatory Visit: Payer: Self-pay

## 2021-10-07 ENCOUNTER — Other Ambulatory Visit (HOSPITAL_COMMUNITY): Payer: Self-pay

## 2021-10-07 ENCOUNTER — Encounter: Payer: Self-pay | Admitting: Allergy & Immunology

## 2021-10-07 ENCOUNTER — Ambulatory Visit: Payer: 59 | Admitting: Allergy & Immunology

## 2021-10-07 VITALS — BP 118/82 | HR 89 | Temp 97.4°F | Resp 18 | Ht 70.0 in | Wt 228.4 lb

## 2021-10-07 DIAGNOSIS — J3089 Other allergic rhinitis: Secondary | ICD-10-CM

## 2021-10-07 DIAGNOSIS — J4540 Moderate persistent asthma, uncomplicated: Secondary | ICD-10-CM

## 2021-10-07 DIAGNOSIS — J302 Other seasonal allergic rhinitis: Secondary | ICD-10-CM

## 2021-10-07 DIAGNOSIS — M542 Cervicalgia: Secondary | ICD-10-CM | POA: Diagnosis not present

## 2021-10-07 DIAGNOSIS — J4541 Moderate persistent asthma with (acute) exacerbation: Secondary | ICD-10-CM

## 2021-10-07 DIAGNOSIS — G43719 Chronic migraine without aura, intractable, without status migrainosus: Secondary | ICD-10-CM | POA: Diagnosis not present

## 2021-10-07 DIAGNOSIS — R0681 Apnea, not elsewhere classified: Secondary | ICD-10-CM

## 2021-10-07 DIAGNOSIS — T7800XD Anaphylactic reaction due to unspecified food, subsequent encounter: Secondary | ICD-10-CM

## 2021-10-07 DIAGNOSIS — G43901 Migraine, unspecified, not intractable, with status migrainosus: Secondary | ICD-10-CM | POA: Diagnosis not present

## 2021-10-07 DIAGNOSIS — M791 Myalgia, unspecified site: Secondary | ICD-10-CM | POA: Diagnosis not present

## 2021-10-07 MED ORDER — MONTELUKAST SODIUM 10 MG PO TABS
10.0000 mg | ORAL_TABLET | Freq: Every day | ORAL | 1 refills | Status: DC
  Filled 2021-10-07 – 2021-12-03 (×2): qty 90, 90d supply, fill #0
  Filled 2022-02-15: qty 90, 90d supply, fill #1

## 2021-10-07 MED ORDER — BACLOFEN 10 MG PO TABS
ORAL_TABLET | ORAL | 1 refills | Status: DC
  Filled 2021-10-07: qty 20, 30d supply, fill #0

## 2021-10-07 MED ORDER — BUDESONIDE-FORMOTEROL FUMARATE 160-4.5 MCG/ACT IN AERO
2.0000 | INHALATION_SPRAY | Freq: Two times a day (BID) | RESPIRATORY_TRACT | 5 refills | Status: DC
  Filled 2021-10-07: qty 10.2, 30d supply, fill #0

## 2021-10-07 MED ORDER — EPINEPHRINE 0.3 MG/0.3ML IJ SOAJ
0.3000 mg | INTRAMUSCULAR | 1 refills | Status: AC | PRN
  Filled 2021-10-07: qty 2, 30d supply, fill #0

## 2021-10-07 MED ORDER — ZONISAMIDE 100 MG PO CAPS
ORAL_CAPSULE | ORAL | 2 refills | Status: DC
  Filled 2021-10-07: qty 30, 30d supply, fill #0

## 2021-10-07 MED ORDER — AZELASTINE HCL 137 MCG/SPRAY NA SOLN
NASAL | 1 refills | Status: DC
  Filled 2021-10-07: qty 90, 75d supply, fill #0
  Filled 2022-03-15: qty 90, 75d supply, fill #1

## 2021-10-07 MED ORDER — LEVALBUTEROL TARTRATE 45 MCG/ACT IN AERO
INHALATION_SPRAY | RESPIRATORY_TRACT | 1 refills | Status: DC
  Filled 2021-10-07: qty 15, 30d supply, fill #0
  Filled 2022-03-16: qty 15, 9d supply, fill #1

## 2021-10-07 NOTE — Patient Instructions (Addendum)
1. Moderate persistent asthma, uncomplicated - Lung testing looked a little lower today. - Start the prednisone pack today.  - We will get your breathing under better control before we start the allergy shots again.  - Daily controller medication(s): Singulair 10mg  daily and Symbicort 160/4.30mcg two puffs twice daily with spacer - Prior to physical activity: Xopenex 2 puffs 10-15 minutes before physical activity. - Rescue medications: Xopenex 4 puffs every 4-6 hours as needed - Asthma control goals:  * Full participation in all desired activities (may need albuterol before activity) * Albuterol use two time or less a week on average (not counting use with activity) * Cough interfering with sleep two time or less a month * Oral steroids no more than once a year * No hospitalizations  2. Chronic rhinitis (grasses, ragweed, weeds, trees, indoor molds, outdoor molds and cat) - Come back on in Thursday or Friday to restart allergy shots.  - Continue with: Singulair (montelukast) 10mg  daily and Astelin (azelastine) two sprays per nostril daily and Allegra (fexofenadine) 180mg  table once daily and Astelin (azelastine) 2 sprays per nostril 1-2 times daily as needed   3. 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   5. Return in about 6 months (around 04/06/2022).    Please inform us of any Emergency Department visits, hospitalizations, or changes in symptoms. Call us before going to the ED for breathing or allergy symptoms since we might be able to fit you in for a sick visit. Feel free to contact us anytime with any questions, problems, or concerns.  It was a pleasure to see you again today!  Websites that have reliable patient information: 1. American Academy of Asthma, Allergy, and Immunology: www.aaaai.org 2. Food Allergy Research and Education (FARE): foodallergy.org 3. Mothers of Asthmatics: http://www.asthmacommunitynetwork.org 4. American College of  Allergy, Asthma, and Immunology: www.acaai.org  Like [place] on [place] and Instagram for our latest updates!      Make sure you are registered to vote! If you have moved or changed any of your contact information, you will need to get this updated before voting!  In some cases, you MAY be able to register to vote online: [email or web address]

## 2021-10-07 NOTE — Progress Notes (Signed)
FOLLOW UP  Date of Service/Encounter:  10/07/21   Assessment:   Moderate persistent asthma, uncomplicated    Seasonal and perennial allergic rhinitis (grasses, ragweed, weeds, trees, indoor molds, outdoor molds and cat) - restarting allergen immunotherapy   Anaphylactic shock due to food (tree nuts) - with negative skin and blood testing (successfully introduced tree nuts back into the diet)  Apnea (experienced during a recent procedure) - referring to Sleep Medicine today    Plan/Recommendations:   1. Moderate persistent asthma, uncomplicated - Lung testing looked a little lower today. - Start the prednisone pack today.  - We will get your breathing under better control before we start the allergy shots again.  - Daily controller medication(s): Singulair 10mg  daily and Symbicort 160/4.7mcg two puffs twice daily with spacer - Prior to physical activity: Xopenex 2 puffs 10-15 minutes before physical activity. - Rescue medications: Xopenex 4 puffs every 4-6 hours as needed - Asthma control goals:  * Full participation in all desired activities (may need albuterol before activity) * Albuterol use two time or less a week on average (not counting use with activity) * Cough interfering with sleep two time or less a month * Oral steroids no more than once a year * No hospitalizations  2. Chronic rhinitis (grasses, ragweed, weeds, trees, indoor molds, outdoor molds and cat) - Come back on in Thursday or Friday to restart allergy shots.  - Continue with: Singulair (montelukast) 10mg  daily and Astelin (azelastine) two sprays per nostril daily and Allegra (fexofenadine) 180mg  table once daily and Astelin (azelastine) 2 sprays per nostril 1-2 times daily as needed   3. 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   5. Return in about 6 months (around 04/06/2022).    Subjective:   MASSEY RUHLAND is a 46 y.o. male presenting today for follow up of  Chief  Complaint  Patient presents with   Follow-up    TRUSTIN CHAPA has a history of the following: Patient Active Problem List   Diagnosis Date Noted   Anaphylactic shock due to adverse food reaction 05/23/2019   Moderate persistent asthma, uncomplicated 26/37/8588   Seasonal and perennial allergic rhinitis 05/23/2019   Migraine 10/19/2018   Healthcare maintenance 05/13/2015   Pain of left thumb 04/12/2015   Obesity (BMI 30.0-34.9) 03/03/2013   Gastroesophageal reflux disease with hiatal hernia 05/06/2012   Essential hypertension 05/06/2012   Irritable bowel syndrome with constipation and diarrhea 05/06/2012   Obstructive sleep apnea 11/19/2008   Hyperlipidemia 03/11/2008   Asthma with allergic rhinitis 03/11/2008    History obtained from: chart review and patient.  Tevan is a 46 y.o. male presenting for a follow up visit.  He was last seen in November 2021 via televisit.  At that time, we started him on a prednisone burst.  We temporarily increase his Symbicort to the 160 mcg dose and starting allergy shots.  I felt that once we got him on allergy shots, we could decrease his medications.  Asthma/Respiratory Symptom History: He has been coughing and using his rescue inhaler for a month or two. He coughing a lot during the day and at night. He has not bene to the hospital for any of this.  The last time that he had steroids was November 2021. Rain tends to make him cough a lot more.   He had COVID back in October 20022. He did have testing recently that was negative.   Allergic Rhinitis Symptom  History: Fall and the first of spring are worse. He would like to restart his allergy shots. He just ended up stopping them last year due to recurrent illnesses.   He remains on the azelastine one spray per nostril up to twice daily.   He was placed on a CPAP at some point several years ago. He had some apnea episodes when he was out for the colonoscopy. His anesthesiologist mentioned this to him  after the procedure. His colonoscopy was largely normal, but he did have diverticulosis noted.    Otherwise, there have been no changes to his past medical history, surgical history, family history, or social history.    Review of Systems  Constitutional: Negative.  Negative for fever, malaise/fatigue and weight loss.  HENT:  Positive for congestion. Negative for ear discharge and ear pain.   Eyes:  Negative for pain, discharge and redness.  Respiratory:  Positive for cough. Negative for sputum production, shortness of breath and wheezing.   Cardiovascular: Negative.  Negative for chest pain and palpitations.  Gastrointestinal:  Negative for abdominal pain, blood in stool, constipation, diarrhea, heartburn, nausea and vomiting.  Skin: Negative.  Negative for itching and rash.  Neurological:  Negative for dizziness and headaches.  Endo/Heme/Allergies:  Negative for environmental allergies. Does not bruise/bleed easily.  All other systems reviewed and are negative.     Objective:   Blood pressure 118/82, pulse 89, temperature (!) 97.4 F (36.3 C), temperature source Temporal, resp. rate 18, height 5\' 10"  (1.778 m), weight 228 lb 6.4 oz (103.6 kg), SpO2 96 %. Body mass index is 32.77 kg/m.   Physical Exam:  Physical Exam Vitals reviewed.  Constitutional:      Appearance: He is well-developed.  HENT:     Head: Normocephalic and atraumatic.     Right Ear: Tympanic membrane, ear canal and external ear normal.     Left Ear: Tympanic membrane, ear canal and external ear normal.     Nose: Mucosal edema and rhinorrhea present. No nasal deformity or septal deviation.     Right Turbinates: Enlarged, swollen and pale.     Left Turbinates: Enlarged, swollen and pale.     Right Sinus: No maxillary sinus tenderness or frontal sinus tenderness.     Left Sinus: No maxillary sinus tenderness or frontal sinus tenderness.     Mouth/Throat:     Mouth: Mucous membranes are not pale and not dry.      Pharynx: Uvula midline.  Eyes:     General:        Right eye: No discharge.        Left eye: No discharge.     Conjunctiva/sclera: Conjunctivae normal.     Right eye: Right conjunctiva is not injected. No chemosis.    Left eye: Left conjunctiva is not injected. No chemosis.    Pupils: Pupils are equal, round, and reactive to light.  Cardiovascular:     Rate and Rhythm: Normal rate and regular rhythm.     Heart sounds: Normal heart sounds.  Pulmonary:     Effort: Pulmonary effort is normal. No tachypnea, accessory muscle usage or respiratory distress.     Breath sounds: Examination of the right-upper field reveals rhonchi. Examination of the left-upper field reveals rhonchi. Examination of the right-middle field reveals rhonchi. Examination of the left-middle field reveals rhonchi. Examination of the right-lower field reveals rhonchi. Examination of the left-lower field reveals rhonchi. Rhonchi present. No wheezing or rales.     Comments: No wheezing noted.  Chest:     Chest wall: No tenderness.  Lymphadenopathy:     Cervical: No cervical adenopathy.  Skin:    Coloration: Skin is not pale.     Findings: No abrasion, erythema, petechiae or rash. Rash is not papular, urticarial or vesicular.  Neurological:     Mental Status: He is alert.  Psychiatric:        Behavior: Behavior is cooperative.     Diagnostic studies:    Spirometry: results abnormal (FEV1: 2.96/74%, FVC: 3.62/72%, FEV1/FVC: 82%).    Spirometry consistent with normal pattern.   Allergy Studies: none        Salvatore Marvel, MD  [facility] [place] [facility] [place]

## 2021-10-08 ENCOUNTER — Other Ambulatory Visit (HOSPITAL_COMMUNITY): Payer: Self-pay

## 2021-10-09 ENCOUNTER — Telehealth: Payer: Self-pay

## 2021-10-09 NOTE — Telephone Encounter (Signed)
-----   Message from Valentina Shaggy, MD sent at 10/07/2021 10:53 PM EST ----- [facility] referral placed.

## 2021-10-10 NOTE — Telephone Encounter (Signed)
Thank you!   Avni Traore, MD [facility]  

## 2021-10-13 ENCOUNTER — Encounter: Payer: Self-pay | Admitting: Internal Medicine

## 2021-10-13 ENCOUNTER — Other Ambulatory Visit: Payer: Self-pay

## 2021-10-13 ENCOUNTER — Other Ambulatory Visit (HOSPITAL_COMMUNITY): Payer: Self-pay

## 2021-10-13 ENCOUNTER — Ambulatory Visit: Payer: 59 | Admitting: Internal Medicine

## 2021-10-13 VITALS — BP 118/84 | HR 91 | Resp 18 | Ht 70.0 in | Wt 230.0 lb

## 2021-10-13 DIAGNOSIS — I10 Essential (primary) hypertension: Secondary | ICD-10-CM

## 2021-10-13 DIAGNOSIS — E782 Mixed hyperlipidemia: Secondary | ICD-10-CM | POA: Diagnosis not present

## 2021-10-13 DIAGNOSIS — E039 Hypothyroidism, unspecified: Secondary | ICD-10-CM | POA: Diagnosis not present

## 2021-10-13 DIAGNOSIS — K582 Mixed irritable bowel syndrome: Secondary | ICD-10-CM

## 2021-10-13 DIAGNOSIS — G4733 Obstructive sleep apnea (adult) (pediatric): Secondary | ICD-10-CM | POA: Diagnosis not present

## 2021-10-13 DIAGNOSIS — R7303 Prediabetes: Secondary | ICD-10-CM

## 2021-10-13 DIAGNOSIS — K219 Gastro-esophageal reflux disease without esophagitis: Secondary | ICD-10-CM | POA: Diagnosis not present

## 2021-10-13 DIAGNOSIS — J4540 Moderate persistent asthma, uncomplicated: Secondary | ICD-10-CM | POA: Diagnosis not present

## 2021-10-13 DIAGNOSIS — E559 Vitamin D deficiency, unspecified: Secondary | ICD-10-CM

## 2021-10-13 DIAGNOSIS — K449 Diaphragmatic hernia without obstruction or gangrene: Secondary | ICD-10-CM

## 2021-10-13 DIAGNOSIS — G43919 Migraine, unspecified, intractable, without status migrainosus: Secondary | ICD-10-CM

## 2021-10-13 DIAGNOSIS — Z1159 Encounter for screening for other viral diseases: Secondary | ICD-10-CM

## 2021-10-13 HISTORY — DX: Hypothyroidism, unspecified: E03.9

## 2021-10-13 MED ORDER — SIMVASTATIN 40 MG PO TABS
40.0000 mg | ORAL_TABLET | Freq: Every day | ORAL | 1 refills | Status: DC
  Filled 2021-10-13: qty 90, 90d supply, fill #0
  Filled 2022-02-15: qty 90, 90d supply, fill #1

## 2021-10-13 MED ORDER — LOSARTAN POTASSIUM-HCTZ 100-25 MG PO TABS
1.0000 | ORAL_TABLET | Freq: Every day | ORAL | 1 refills | Status: DC
  Filled 2021-10-13: qty 90, 90d supply, fill #0
  Filled 2022-02-15: qty 90, 90d supply, fill #1

## 2021-10-13 MED ORDER — THYROID 90 MG PO TABS
90.0000 mg | ORAL_TABLET | Freq: Every day | ORAL | 1 refills | Status: DC
  Filled 2021-10-13: qty 90, 90d supply, fill #0
  Filled 2022-02-15: qty 90, 90d supply, fill #1

## 2021-10-13 MED ORDER — PANTOPRAZOLE SODIUM 40 MG PO TBEC
40.0000 mg | DELAYED_RELEASE_TABLET | Freq: Every day | ORAL | 1 refills | Status: DC
  Filled 2021-10-13: qty 90, 90d supply, fill #0
  Filled 2022-02-15: qty 90, 90d supply, fill #1

## 2021-10-13 NOTE — Progress Notes (Addendum)
New Patient Office Visit  Subjective:  Patient ID: Sergio [name], male    DOB: [birth year]  Age: 46 y.o. MRN: [id number]  CC:  Chief Complaint  Patient presents with   New Patient (Initial Visit)    New patient was seeing dr Anastasio Champion just establishing care today     HPI Sergio [name] is a 46 y.o. male with past medical history of HTN, migraine, asthma, OSA, GERD, IBS and HLD who presents for establishing care.  HTN: BP is well-controlled. Takes medications regularly. Patient denies headache, dizziness, chest pain, dyspnea or palpitations.  He also takes Zocor for HLD.  Asthma and allergic rhinitis: He uses Symbicort and as needed Xopenex for asthma.  Denies any dyspnea or wheezing currently.  He uses azelastine nasal spray as needed for allergies.  He also takes Zyrtec and Singulair for allergies.  He has a history of migraine, for which he takes zonisamide. He follows up with neurology.  He uses CPAP for OSA.  Denies any major discomfort currently.  He has been taking NP thyroid, although his chart review suggests history of normal TSH.  I have discussed about continuing it for now, but will have to adjust dose or change to levothyroxine if his thyroid function test is abnormal.  He currently denies any tremors or palpitations.  Denies any recent change in appetite or weight.  He takes Levsin for history of IBS.  He has alternating constipation and diarrhea.  Denies any melena or hematochezia currently.   Past Medical History:  Diagnosis Date   Asthma with allergic rhinitis 03/11/2008   Essential hypertension 05/06/2012   Gastroesophageal reflux disease with hiatal hernia 05/06/2012   Gastroesophageal reflux disease with hiatal hernia    Hyperlipidemia LDL goal < 160 03/11/2008   Hypothyroidism 10/13/2021   Irritable bowel syndrome with constipation and diarrhea 05/06/2012   Migraine    Obesity (BMI 30.0-34.9) 03/03/2013   Obstructive sleep apnea 11/19/2008   Nocturnal  polysomnography 12/14/2008: AHI 20.8, O2 Sat nadir 84%, optimal CPAP 12 cm H2O    Urticaria     Past Surgical History:  Procedure Laterality Date   COLONOSCOPY WITH PROPOFOL N/A 08/06/2021   Procedure: COLONOSCOPY WITH PROPOFOL;  Surgeon: Harvel Quale, MD;  Location: [facility];  Service: Gastroenterology;  Laterality: N/A;  915   POLYPECTOMY  08/06/2021   Procedure: POLYPECTOMY;  Surgeon: Montez Morita, Quillian Quince, MD;  Location: [facility];  Service: Gastroenterology;;  cecum and ascending   URETHRAL DILATION      Family History  Problem Relation Age of Onset   Early death Father        Accident   Hypertension Maternal Grandmother    Vaginal cancer Maternal Grandmother    Parkinson's disease Maternal Grandmother    Diabetes Paternal Grandfather    ALS Paternal Grandfather    Breast cancer Paternal Grandmother    Kidney cancer Paternal 73    Healthy Mother    Healthy Sister    Healthy Sister    Hypertension Maternal Uncle    Diabetes Paternal Aunt    COPD Maternal Grandfather    Urticaria Neg Hx    Immunodeficiency Neg Hx    Eczema Neg Hx    Atopy Neg Hx    Angioedema Neg Hx    Allergic rhinitis Neg Hx    Asthma Neg Hx     Social History   Socioeconomic History   Marital status: Married    Spouse name: Not on file  Number of children: 0   Years of education: college   Highest education level: Not on file  Occupational History   Occupation: Medical Records  Tobacco Use   Smoking status: Never   Smokeless tobacco: Never  Vaping Use   Vaping Use: Never used  Substance and Sexual Activity   Alcohol use: Yes    Comment: occ   Drug use: No   Sexual activity: Yes    Birth control/protection: None  Other Topics Concern   Not on file  Social History Narrative   Works for [employer] in the [employer].  Married for 23 years, no children.   Right-handed.   Occasionally caffeine use.   Social Determinants of Health   Financial  Resource Strain: Not on file  Food Insecurity: Not on file  Transportation Needs: Not on file  Physical Activity: Not on file  Stress: Not on file  Social Connections: Not on file  Intimate Partner Violence: Not on file    ROS Review of Systems  Constitutional:  Negative for chills and fever.  HENT:  Negative for congestion and sore throat.   Eyes:  Negative for pain and discharge.  Respiratory:  Negative for cough and shortness of breath.   Cardiovascular:  Negative for chest pain and palpitations.  Gastrointestinal:  Positive for constipation. Negative for nausea and vomiting.  Endocrine: Negative for polydipsia and polyuria.  Genitourinary:  Negative for dysuria and hematuria.  Musculoskeletal:  Negative for neck pain and neck stiffness.  Skin:  Negative for rash.  Allergic/Immunologic: Positive for environmental allergies.  Neurological:  Positive for headaches. Negative for dizziness, weakness and numbness.  Psychiatric/Behavioral:  Negative for agitation and behavioral problems.    Objective:   Today's Vitals: BP 118/84 (BP Location: Right Arm, Patient Position: Sitting, Cuff Size: Normal)   Pulse 91   Resp 18   Ht 5' 10"  (1.778 m)   Wt 230 lb 0.6 oz (104.3 kg)   SpO2 97%   BMI 33.01 kg/m   Physical Exam Vitals reviewed.  Constitutional:      General: He is not in acute distress.    Appearance: He is not diaphoretic.  HENT:     Head: Normocephalic and atraumatic.     Nose: Nose normal.     Mouth/Throat:     Mouth: Mucous membranes are moist.  Eyes:     General: No scleral icterus.    Extraocular Movements: Extraocular movements intact.  Cardiovascular:     Rate and Rhythm: Normal rate and regular rhythm.     Pulses: Normal pulses.     Heart sounds: Normal heart sounds. No murmur heard. Pulmonary:     Breath sounds: Normal breath sounds. No wheezing or rales.  Abdominal:     Palpations: Abdomen is soft.     Tenderness: There is no abdominal tenderness.   Musculoskeletal:     Cervical back: Neck supple. No tenderness.     Right lower leg: No edema.     Left lower leg: No edema.  Skin:    General: Skin is warm.     Findings: No rash.  Neurological:     General: No focal deficit present.     Mental Status: He is alert and oriented to person, place, and time.     Sensory: No sensory deficit.     Motor: No weakness.  Psychiatric:        Mood and Affect: Mood normal.        Behavior: Behavior normal.  Assessment & Plan:   Problem List Items Addressed This Visit       Cardiovascular and Mediastinum   Essential hypertension - Primary (Chronic)    BP Readings from Last 1 Encounters:  10/13/21 118/84  Well-controlled with Hyzaar Counseled for compliance with the medications Advised DASH diet and moderate exercise/walking, at least 150 mins/week      Relevant Medications   losartan-hydrochlorothiazide (HYZAAR) 100-25 MG tablet   simvastatin (ZOCOR) 40 MG tablet   Other Relevant Orders   CMP14+EGFR   CBC with Differential/Platelet   Migraine    Well controlled with zonisamide Followed by neurology      Relevant Medications   losartan-hydrochlorothiazide (HYZAAR) 100-25 MG tablet   simvastatin (ZOCOR) 40 MG tablet     Respiratory   Asthma with allergic rhinitis (Chronic)    Well controlled with Symbicort Uses azelastine spray for allergic rhinitis On Zyrtec and Singulair for allergies      Obstructive sleep apnea (Chronic)    Uses CPAP regularly, continues to benefit from it      Gastroesophageal reflux disease with hiatal hernia (Chronic)    Well controlled with pantoprazole      Relevant Medications   pantoprazole (PROTONIX) 40 MG tablet     Digestive   Irritable bowel syndrome with constipation and diarrhea (Chronic)    Well controlled with Levsin Followed by GI      Relevant Medications   pantoprazole (PROTONIX) 40 MG tablet     Endocrine   Hypothyroidism    Lab Results  Component Value Date    TSH 1.04 04/01/2021  On NP thyroid 90 mg daily Will continue for now Check TSH and free T4       Relevant Medications   thyroid (NP THYROID) 90 MG tablet   Other Relevant Orders   TSH + free T4     Other   Hyperlipidemia (Chronic)    On statin Check lipid profile      Relevant Medications   losartan-hydrochlorothiazide (HYZAAR) 100-25 MG tablet   simvastatin (ZOCOR) 40 MG tablet   Other Relevant Orders   Lipid Profile   Other Visit Diagnoses     Prediabetes       Relevant Orders   Hemoglobin A1c   CMP14+EGFR   Need for hepatitis C screening test       Relevant Orders   Hepatitis C Antibody   Vitamin D deficiency       Relevant Orders   VITAMIN D 25 Hydroxy (Vit-D Deficiency, Fractures)       Outpatient Encounter Medications as of 10/13/2021  Medication Sig   Azelastine HCl 137 MCG/SPRAY SOLN INSTILL 2 SPRAYS INTO BOTH NOSTRILS TWICE DAILY   baclofen (LIORESAL) 10 MG tablet Take 1 tablet by mouth 2 times a day as needed. Limit 1-2 treatments per week   budesonide-formoterol (SYMBICORT) 160-4.5 MCG/ACT inhaler INHALE 2 PUFFS INTO THE LUNGS TWO TIMES DAILY   busPIRone (BUSPAR) 5 MG tablet TAKE 1 TABLET BY MOUTH 3 TIMES DAILY (NEEDS APPT) (Patient taking differently: Take 5 mg by mouth 3 (three) times daily as needed (anxiety).)   cetirizine (ZYRTEC) 10 MG tablet Take 10 mg by mouth daily.   Cholecalciferol (VITAMIN D3) 125 MCG (5000 UT) TABS Take 5,000 Units by mouth daily.   dicyclomine (BENTYL) 10 MG capsule Take 1 capsule (10 mg total) by mouth 3 (three) times daily before meals. (Patient taking differently: Take 10 mg by mouth 3 (three) times daily as needed (cramps).)   diphenhydrAMINE (  BENADRYL) 25 MG tablet Take 50 mg by mouth daily as needed for itching.   EPINEPHrine 0.3 mg/0.3 mL IJ SOAJ injection Inject 0.3 mg into the muscle as needed for anaphylaxis.   hyoscyamine (LEVSIN) 0.125 MG tablet Take 1 tablet (0.125 mg total) by mouth every 6 (six) hours as needed  (abdominal pain).   levalbuterol (XOPENEX HFA) 45 MCG/ACT inhaler Inhale 4 puffs by mouth every 4-6 hours as needed.   montelukast (SINGULAIR) 10 MG tablet Take 1 tablet (10 mg total) by mouth daily.   zonisamide (ZONEGRAN) 100 MG capsule Take 1 capsule (100 mg total) by mouth daily.   [DISCONTINUED] losartan-hydrochlorothiazide (HYZAAR) 100-25 MG tablet Take 1 tablet by mouth daily.   [DISCONTINUED] pantoprazole (PROTONIX) 40 MG tablet Take 1 tablet (40 mg total) by mouth daily.   [DISCONTINUED] simvastatin (ZOCOR) 40 MG tablet Take 1 tablet (40 mg total) by mouth at bedtime.   [DISCONTINUED] thyroid (NP THYROID) 90 MG tablet Take 1 tablet (90 mg total) by mouth daily.   losartan-hydrochlorothiazide (HYZAAR) 100-25 MG tablet Take 1 tablet by mouth daily.   pantoprazole (PROTONIX) 40 MG tablet Take 1 tablet (40 mg total) by mouth daily.   simvastatin (ZOCOR) 40 MG tablet Take 1 tablet (40 mg total) by mouth at bedtime.   thyroid (NP THYROID) 90 MG tablet Take 1 tablet (90 mg total) by mouth daily.   [DISCONTINUED] baclofen (LIORESAL) 10 MG tablet Take 1 tablet by mouth twice daily as needed. Limit 1-2 treatments per week (Patient not taking: Reported on 10/13/2021)   [DISCONTINUED] zonisamide (ZONEGRAN) 100 MG capsule Take 1 capsule by mouth daily for 30 days (Patient not taking: Reported on 10/13/2021)   No facility-administered encounter medications on file as of 10/13/2021.    Follow-up: Return in about 6 months (around 04/12/2022) for Annual physical.   Lindell Spar, MD

## 2021-10-13 NOTE — Patient Instructions (Signed)
Please continue taking medications as prescribed.  Please continue to follow low salt diet and perform moderate exercise/walking at least 150 mins/week.  Please get fasting blood tests done before the next visit. 

## 2021-10-14 ENCOUNTER — Other Ambulatory Visit (HOSPITAL_COMMUNITY): Payer: Self-pay

## 2021-10-17 NOTE — Assessment & Plan Note (Signed)
Uses CPAP regularly, continues to benefit from it 

## 2021-10-17 NOTE — Assessment & Plan Note (Signed)
On statin Check lipid profile 

## 2021-10-17 NOTE — Assessment & Plan Note (Signed)
Well controlled with pantoprazole 

## 2021-10-17 NOTE — Assessment & Plan Note (Signed)
Well controlled with Levsin Followed by GI

## 2021-10-17 NOTE — Assessment & Plan Note (Signed)
BP Readings from Last 1 Encounters:  10/13/21 118/84   Well-controlled with Hyzaar Counseled for compliance with the medications Advised DASH diet and moderate exercise/walking, at least 150 mins/week

## 2021-10-17 NOTE — Assessment & Plan Note (Signed)
Well controlled with zonisamide Followed by neurology

## 2021-10-17 NOTE — Assessment & Plan Note (Addendum)
Well controlled with Symbicort Uses azelastine spray for allergic rhinitis On Zyrtec and Singulair for allergies

## 2021-10-17 NOTE — Assessment & Plan Note (Signed)
Lab Results  Component Value Date   TSH 1.04 04/01/2021   On NP thyroid 90 mg daily Will continue for now Check TSH and free T4

## 2021-10-23 ENCOUNTER — Ambulatory Visit: Payer: 59 | Admitting: Neurology

## 2021-10-23 ENCOUNTER — Encounter: Payer: Self-pay | Admitting: Neurology

## 2021-10-23 ENCOUNTER — Other Ambulatory Visit: Payer: Self-pay

## 2021-10-23 VITALS — BP 122/86 | HR 95 | Ht 70.0 in | Wt 226.0 lb

## 2021-10-23 DIAGNOSIS — R635 Abnormal weight gain: Secondary | ICD-10-CM

## 2021-10-23 DIAGNOSIS — G4733 Obstructive sleep apnea (adult) (pediatric): Secondary | ICD-10-CM | POA: Diagnosis not present

## 2021-10-23 DIAGNOSIS — G4719 Other hypersomnia: Secondary | ICD-10-CM

## 2021-10-23 DIAGNOSIS — R519 Headache, unspecified: Secondary | ICD-10-CM

## 2021-10-23 DIAGNOSIS — R351 Nocturia: Secondary | ICD-10-CM

## 2021-10-23 DIAGNOSIS — E669 Obesity, unspecified: Secondary | ICD-10-CM | POA: Diagnosis not present

## 2021-10-23 DIAGNOSIS — E66811 Obesity, class 1: Secondary | ICD-10-CM

## 2021-10-23 NOTE — Patient Instructions (Signed)
It was nice to meet you today!   Here is what we discussed today:    Based on your symptoms and your exam I believe you are still at risk for obstructive sleep apnea (aka OSA), and I think we should proceed with a sleep study to determine whether you do or do not have OSA and how severe it is. Even, if you have mild OSA, I may want you to consider treatment with CPAP, as treatment of even borderline or mild sleep apnea can result and improvement of symptoms such as sleep disruption, daytime sleepiness, nighttime bathroom breaks, restless leg symptoms, improvement of headache syndromes, even improved mood disorder.   As explained, an attended sleep study (meaning you get to stay overnight in the sleep lab), lets [place] monitor sleep-related behaviors such as sleep talking and leg movements in sleep, in addition to monitoring for sleep apnea.  A home sleep test is a screening tool for sleep apnea diagnosis only, but unfortunately, does not help with any other sleep-related diagnoses.  Please remember, the long-term risks and ramifications of untreated moderate to severe obstructive sleep apnea may include (but are not limited to): increased risk for cardiovascular disease, including congestive heart failure, stroke, difficult to control hypertension, treatment resistant obesity, arrhythmias, especially irregular heartbeat commonly known as A. Fib. (atrial fibrillation); even type 2 diabetes has been linked to untreated OSA.  Other correlations that untreated obstructive sleep apnea include macular edema which is swelling of the retina in the eyes, droopy eyelid syndrome, and elevated hemoglobin and hematocrit levels (often referred to as polycythemia).  Sleep apnea can cause disruption of sleep and sleep deprivation in most cases, which, in turn, can cause recurrent headaches, problems with memory, mood, concentration, focus, and vigilance. Most people with untreated sleep apnea report excessive daytime  sleepiness, which can affect their ability to drive. Please do not drive if you feel sleepy. Patients with sleep apnea can also develop difficulty initiating and maintaining sleep (aka insomnia).   Having sleep apnea may increase your risk for other sleep disorders, including involuntary behaviors sleep such as sleep terrors, sleep talking, sleepwalking.    Having sleep apnea can also increase your risk for restless leg syndrome and leg movements at night.   Please note that untreated obstructive sleep apnea may carry additional perioperative morbidity. Patients with significant obstructive sleep apnea (typically, in the moderate to severe degree) should receive, if possible, perioperative PAP (positive airway pressure) therapy and the surgeons and particularly the anesthesiologists should be informed of the diagnosis and the severity of the sleep disordered breathing.   I will likely see you back after your sleep study to go over the test results and where to go from there. We will call you after your sleep study to advise about the results (most likely, you will hear from [facility], my nurse) and to set up an appointment at the time, as necessary.    Our sleep lab administrative assistant will call you to schedule your sleep study and give you further instructions, regarding the check in process for the sleep study, arrival time, what to bring, when you can expect to leave after the study, etc., and to answer any other logistical questions you may have. If you don't hear back from her by about 2 weeks from now, please feel free to call her direct line at [phone] or you can call our general clinic number, or email [place] through My Chart.

## 2021-10-23 NOTE — Progress Notes (Signed)
Subjective:    Patient ID: Sergio [name] is a 46 y.o. male.  HPI   Star Age, MD, PhD [facility] [address] [place], [place] [address]   Dear Dr. Ernst Bowler,  I saw your patient, Sergio [name], upon your kind request in my sleep clinic today for initial consultation of his sleep disorder, in particular, evaluation of his prior diagnosis of obstructive sleep apnea.  The patient is unaccompanied today.  As you know, [name] is a 46 year old right-handed gentleman with an underlying medical history of asthma, allergic rhinitis, reflux disease, hypothyroidism, migraine headaches, hyperlipidemia, hypertension and obesity, who was previously diagnosed with obstructive sleep apnea and tried CPAP therapy in the past.  He is no longer on CPAP treatment, he estimates that he tried it for about a year.  It was difficult to use it and he was not consistent with it but does admit that he actually benefited from treatment.  He has had some weight gain over time.  He would be willing to get reevaluated and was encouraged to do so because of apneic pauses noted during the recent colonoscopy.  I reviewed your office note from 10/07/2021.  I was able to review his sleep study report.  He had testing through [facility] sleep lab on 12/14/2008 and study was interpreted by Dr. Baird Lyons.  He had a split-night sleep study.  Baseline AHI was 20.8/h, REM AHI was 43.2/h.  O2 nadir was 84%.  CPAP of 12 cm was recommended.  His weight was 210 at the time.  His Epworth sleepiness score is 19 out of 24, fatigue severity score is 49 out of 63.  He does report witnessed apneas.  He is working on weight loss and has started with [employer].  He is exercising more.  Bedtime is generally around 11 and rise time around 6.  He has a history of migraines and has woken up with a headache but the morning headaches are less severe, more dull and achy.  He is followed by Dr[facility]  Domingo Cocking [facility] his migraines and takes trigger point injections every 3 months and takes Zonegran daily.  He does not have a family history of sleep apnea.  He is a restless sleeper per wife's feedback.  He does not watch TV in his bedroom.  He lives with his wife, no children, they have 2 dogs in the household.  He has nocturia about 3 times per average night.  He works as Dealer for [employer].  He drinks caffeine in the form of coffee, 1 or 2 cups/day, occasional alcohol and, he is a non-smoker.  His Past Medical History Is Significant For: Past Medical History:  Diagnosis Date   Asthma with allergic rhinitis 03/11/2008   Essential hypertension 05/06/2012   Gastroesophageal reflux disease with hiatal hernia 05/06/2012   Gastroesophageal reflux disease with hiatal hernia    Hyperlipidemia LDL goal < 160 03/11/2008   Hypothyroidism 10/13/2021   Irritable bowel syndrome with constipation and diarrhea 05/06/2012   Migraine    Obesity (BMI 30.0-34.9) 03/03/2013   Obstructive sleep apnea 11/19/2008   Nocturnal polysomnography 12/14/2008: AHI 20.8, O2 Sat nadir 84%, optimal CPAP 12 cm H2O    Urticaria     His Past Surgical History Is Significant For: Past Surgical History:  Procedure Laterality Date   COLONOSCOPY WITH PROPOFOL N/A 08/06/2021   Procedure: COLONOSCOPY WITH PROPOFOL;  Surgeon: Montez Morita,  Quillian Quince, MD;  Location: [facility];  Service: Gastroenterology;  Laterality: N/A;  915   POLYPECTOMY  08/06/2021   Procedure: POLYPECTOMY;  Surgeon: Montez Morita, Quillian Quince, MD;  Location: [facility];  Service: Gastroenterology;;  cecum and ascending   URETHRAL DILATION      His Family History Is Significant For: Family History  Problem Relation Age of Onset   Early death Father        Accident   Hypertension Maternal Grandmother    Vaginal cancer Maternal Grandmother    Parkinson's disease Maternal Grandmother    Diabetes Paternal  Grandfather    ALS Paternal Grandfather    Breast cancer Paternal Grandmother    Kidney cancer Paternal 41    Healthy Mother    Healthy Sister    Healthy Sister    Hypertension Maternal Uncle    Diabetes Paternal Aunt    COPD Maternal Grandfather    Urticaria Neg Hx    Immunodeficiency Neg Hx    Eczema Neg Hx    Atopy Neg Hx    Angioedema Neg Hx    Allergic rhinitis Neg Hx    Asthma Neg Hx     His Social History Is Significant For: Social History   Socioeconomic History   Marital status: Married    Spouse name: Not on file   Number of children: 0   Years of education: college   Highest education level: Not on file  Occupational History   Occupation: Medical Records  Tobacco Use   Smoking status: Never   Smokeless tobacco: Never  Vaping Use   Vaping Use: Never used  Substance and Sexual Activity   Alcohol use: Yes    Comment: occ   Drug use: No   Sexual activity: Yes    Birth control/protection: None  Other Topics Concern   Not on file  Social History Narrative   Works for [employer] in the [employer].  Married for 23 years, no children.   Right-handed.   Occasionally caffeine use.   Social Determinants of Health   Financial Resource Strain: Not on file  Food Insecurity: Not on file  Transportation Needs: Not on file  Physical Activity: Not on file  Stress: Not on file  Social Connections: Not on file    His Allergies Are:  Allergies  Allergen Reactions   Other Hives and Swelling    Cashews   :   His Current Medications Are:  Outpatient Encounter Medications as of 10/23/2021  Medication Sig   Azelastine HCl 137 MCG/SPRAY SOLN INSTILL 2 SPRAYS INTO BOTH NOSTRILS TWICE DAILY   baclofen (LIORESAL) 10 MG tablet Take 1 tablet by mouth 2 times a day as needed. Limit 1-2 treatments per week   budesonide-formoterol (SYMBICORT) 160-4.5 MCG/ACT inhaler INHALE 2 PUFFS INTO THE LUNGS TWO TIMES DAILY   busPIRone (BUSPAR) 5 MG tablet TAKE 1 TABLET  BY MOUTH 3 TIMES DAILY (NEEDS APPT) (Patient taking differently: Take 5 mg by mouth 3 (three) times daily as needed (anxiety).)   cetirizine (ZYRTEC) 10 MG tablet Take 10 mg by mouth daily.   Cholecalciferol (VITAMIN D3) 125 MCG (5000 UT) TABS Take 5,000 Units by mouth daily.   dicyclomine (BENTYL) 10 MG capsule Take 1 capsule (10 mg total) by mouth 3 (three) times daily before meals. (Patient taking differently: Take 10 mg by mouth 3 (three) times daily as needed (cramps).)   diphenhydrAMINE (BENADRYL) 25 MG tablet Take 50 mg by mouth daily as needed for itching.  EPINEPHrine 0.3 mg/0.3 mL IJ SOAJ injection Inject 0.3 mg into the muscle as needed for anaphylaxis.   hyoscyamine (LEVSIN) 0.125 MG tablet Take 1 tablet (0.125 mg total) by mouth every 6 (six) hours as needed (abdominal pain).   levalbuterol (XOPENEX HFA) 45 MCG/ACT inhaler Inhale 4 puffs by mouth every 4-6 hours as needed.   losartan-hydrochlorothiazide (HYZAAR) 100-25 MG tablet Take 1 tablet by mouth daily.   montelukast (SINGULAIR) 10 MG tablet Take 1 tablet (10 mg total) by mouth daily.   pantoprazole (PROTONIX) 40 MG tablet Take 1 tablet (40 mg total) by mouth daily.   simvastatin (ZOCOR) 40 MG tablet Take 1 tablet (40 mg total) by mouth at bedtime.   thyroid (NP THYROID) 90 MG tablet Take 1 tablet (90 mg total) by mouth daily.   zonisamide (ZONEGRAN) 100 MG capsule Take 1 capsule (100 mg total) by mouth daily.   No facility-administered encounter medications on file as of 10/23/2021.  :   Review of Systems:  Out of a complete 14 point review of systems, all are reviewed and negative with the exception of these symptoms as listed below:  Review of Systems  Neurological:        Here for sleep consult. Pt reports during a colonoscopy he was told he needed a sleep study. Pt estimates first sleep study was in early 2000 started on CPAP. Pt reports he had trouble tolerating the mask.    Objective:  Neurological Exam  Physical  Exam Physical Examination:   Vitals:   10/23/21 1116  BP: 122/86  Pulse: 95  SpO2: 93%    General Examination: The patient is a very pleasant 46 y.o. male in no acute distress. He appears well-developed and well-nourished and well groomed.   HEENT: Normocephalic, atraumatic, pupils are equal, round and reactive to light, extraocular tracking is good without limitation to gaze excursion or nystagmus noted. Hearing is grossly intact. Face is symmetric with normal facial animation. Speech is clear with no dysarthria noted. There is no hypophonia. There is no lip, neck/head, jaw or voice tremor. Neck is supple with full range of passive and active motion. There are no carotid bruits on auscultation. Oropharynx exam reveals: mild mouth dryness, good dental hygiene and moderate to significant airway crowding, due to tonsillar size of about 2+, larger uvula noted, Mallampati class II.  Neck circumference of 18-3/8 inches.  He has a mild to moderate overbite.  Tongue protrudes centrally and palate elevates symmetrically.   Chest: Clear to auscultation without wheezing, rhonchi or crackles noted.  Heart: S1+S2+0, regular and normal without murmurs, rubs or gallops noted.   Abdomen: Soft, non-tender and non-distended.  Extremities: There is no pitting edema in the distal lower extremities bilaterally.   Skin: Warm and dry without trophic changes noted.   Musculoskeletal: exam reveals no obvious joint deformities, tenderness or joint swelling or erythema.   Neurologically:  Mental status: The patient is awake, alert and oriented in all 4 spheres. His immediate and remote memory, attention, language skills and fund of knowledge are appropriate. There is no evidence of aphasia, agnosia, apraxia or anomia. Speech is clear with normal prosody and enunciation. Thought process is linear. Mood is normal and affect is normal.  Cranial nerves II - XII are as described above under HEENT exam.  Motor exam:  Normal bulk, strength and tone is noted. There is no tremor. Fine motor skills and coordination: grossly intact.  Cerebellar testing: No dysmetria or intention tremor. There is no truncal  or gait ataxia.  Sensory exam: intact to light touch in the upper and lower extremities.  Gait, station and balance: He stands easily. No veering to one side is noted. No leaning to one side is noted. Posture is age-appropriate and stance is narrow based. Gait shows normal stride length and normal pace. No problems turning are noted.   Assessment and Plan:  In summary, PERRION DIESEL is a very pleasant 46 y.o.-year old male with an underlying medical history of asthma, allergic rhinitis, reflux disease, hypothyroidism, migraine headaches, hyperlipidemia, hypertension and obesity, who presents for evaluation of his prior diagnosis of obstructive sleep apnea.  He was diagnosed with moderate obstructive sleep apnea in 2010.  He has had some weight fluctuation.  He was recently encouraged to seek evaluation for sleep apnea after his colonoscopy.  I had a long chat with the patient about my findings and the diagnosis of OSA, its prognosis and treatment options. We talked about medical treatments, surgical interventions and non-pharmacological approaches. I explained in particular the risks and ramifications of untreated moderate to severe OSA, especially with respect to developing cardiovascular disease down the Road, including congestive heart failure, difficult to treat hypertension, cardiac arrhythmias, or stroke. Even type 2 diabetes has, in part, been linked to untreated OSA. Symptoms of untreated OSA include daytime sleepiness, memory problems, mood irritability and mood disorder such as depression and anxiety, lack of energy, as well as recurrent headaches, especially morning headaches. We talked about trying to maintain a healthy lifestyle in general, as well as the importance of weight control. We also talked about the  importance of good sleep hygiene. I recommended the following at this time: sleep study.  I outlined the differences between a laboratory attended sleep study versus home sleep test.  I explained the sleep test procedure to the patient and also outlined possible surgical and non-surgical treatment options of OSA, including the use of a custom-made dental device (which would require a referral to a specialist dentist or oral surgeon), upper airway surgical options, such as traditional UPPP or a novel less invasive surgical option in the form of Inspire hypoglossal nerve stimulation (which would involve a referral to an ENT surgeon). I also explained the CPAP treatment option to the patient, who indicated that he would be willing to try CPAP therapy again if the need arises. I explained the importance of being compliant with PAP treatment, not only for insurance purposes but primarily to improve His symptoms, and for the patient's long term health benefit, including to reduce His cardiovascular risks. I answered all his questions today and the patient was in agreement. I plan to see him back after the sleep study is completed and encouraged him to call with any interim questions, concerns, problems or updates.   Thank you very much for allowing me to participate in the care of this nice patient. If I can be of any further assistance to you please do not hesitate to call me at [phone].  Sincerely,   Star Age, MD, PhD

## 2021-10-24 NOTE — Telephone Encounter (Signed)
Vials have been pulled from inactive and have been placed in the start tray in [place]. Can you please give him a call and schedule him an appointment to restart.

## 2021-10-24 NOTE — Telephone Encounter (Signed)
He can start whenever from my perspective. It looks like his vials expire in April, but we can see how far he gets.   Salvatore Marvel, MD [facility] [place]

## 2021-10-24 NOTE — Telephone Encounter (Signed)
Please see Patients message regarding starting allergy shots. Are they in the [facility]?

## 2021-10-30 ENCOUNTER — Ambulatory Visit: Payer: 59

## 2021-11-03 ENCOUNTER — Ambulatory Visit: Payer: 59 | Admitting: Internal Medicine

## 2021-11-03 ENCOUNTER — Other Ambulatory Visit (HOSPITAL_COMMUNITY): Payer: Self-pay

## 2021-11-03 ENCOUNTER — Other Ambulatory Visit: Payer: Self-pay

## 2021-11-03 ENCOUNTER — Encounter: Payer: Self-pay | Admitting: Allergy & Immunology

## 2021-11-03 ENCOUNTER — Encounter: Payer: Self-pay | Admitting: Internal Medicine

## 2021-11-03 VITALS — BP 98/70 | HR 120 | Temp 98.3°F | Resp 16 | Ht 70.0 in | Wt 228.8 lb

## 2021-11-03 DIAGNOSIS — J4541 Moderate persistent asthma with (acute) exacerbation: Secondary | ICD-10-CM | POA: Diagnosis not present

## 2021-11-03 DIAGNOSIS — R0602 Shortness of breath: Secondary | ICD-10-CM

## 2021-11-03 DIAGNOSIS — J3089 Other allergic rhinitis: Secondary | ICD-10-CM | POA: Diagnosis not present

## 2021-11-03 DIAGNOSIS — J019 Acute sinusitis, unspecified: Secondary | ICD-10-CM

## 2021-11-03 DIAGNOSIS — T7800XD Anaphylactic reaction due to unspecified food, subsequent encounter: Secondary | ICD-10-CM

## 2021-11-03 DIAGNOSIS — J302 Other seasonal allergic rhinitis: Secondary | ICD-10-CM

## 2021-11-03 MED ORDER — BREZTRI AEROSPHERE 160-9-4.8 MCG/ACT IN AERO
2.0000 | INHALATION_SPRAY | Freq: Two times a day (BID) | RESPIRATORY_TRACT | 3 refills | Status: DC
  Filled 2021-11-03: qty 10.7, 30d supply, fill #0
  Filled 2022-03-15: qty 10.7, 30d supply, fill #1

## 2021-11-03 MED ORDER — AMOXICILLIN-POT CLAVULANATE 500-125 MG PO TABS
1.0000 | ORAL_TABLET | Freq: Two times a day (BID) | ORAL | 0 refills | Status: AC
  Filled 2021-11-03: qty 14, 7d supply, fill #0

## 2021-11-03 NOTE — Patient Instructions (Addendum)
Sinus infection with asthma flare:  - Covid testing was negative - prednisone burst (pack provided in clinic) -Labs to determine candidacy for asthma biologics - Augmentin 500 mg twice daily for 7 days - Start Breztri 2 puffs twice a day and use in place of symbicort 160, continue until your next office visit since you have needed 2 bursts of steroids back to back - use scheduled albuterol every 6 hours while awake for the next 2 to 3 days  2. Moderate persistent asthma-not controlled - We will get your breathing under better control before we start the allergy shots again.  - Daily controller medication(s): Singulair 10mg  daily and Breztri two puffs twice daily with spacer, continue until your next office visit - Prior to physical activity: Xopenex 2 puffs 10-15 minutes before physical activity. - Rescue medications: Xopenex 4 puffs every 4-6 hours as needed - Asthma control goals:  * Full participation in all desired activities (may need albuterol before activity) * Albuterol use two time or less a week on average (not counting use with activity) * Cough interfering with sleep two time or less a month * Oral steroids no more than once a year * No hospitalizations  2. Chronic rhinitis (grasses, ragweed, weeds, trees, indoor molds, outdoor molds and cat) - Continue with: Singulair (montelukast) 10mg  daily and Astelin (azelastine) two sprays per nostril daily and Allegra (fexofenadine) 180mg  table once daily and Astelin (azelastine) 2 sprays per nostril 1-2 times daily as needed - hold on allergy injections until your asthma is better controlled   3. 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   4. Follow-up en 6 to 8 weeks, sooner if needed.   Please inform us of any Emergency Department visits, hospitalizations, or changes in symptoms. Call us before going to the ED for breathing or allergy symptoms since we might be able to fit you in for a sick visit.  Feel free to contact us anytime with any questions, problems, or concerns.  It was a pleasure to meet you today!  Websites that have reliable patient information: 1. American Academy of Asthma, Allergy, and Immunology: www.aaaai.org 2. Food Allergy Research and Education (FARE): foodallergy.org 3. Mothers of Asthmatics: http://www.asthmacommunitynetwork.org 4. [employer] of Allergy, Asthma, and immunology

## 2021-11-03 NOTE — Progress Notes (Signed)
FOLLOW UP Date of Service/Encounter:  11/03/21   Subjective:  Sergio [name] (DOB: Aug 31, 1976) is a 46 y.o. male who returns to the [facility] on 11/03/2021 in re-evaluation of the following: Moderate persistent asthma, seasonal and perennial allergic rhinitis, anaphylactic shock due to food (tree nuts) History obtained from: chart review and patient.  For Review, LV was on 10/07/21  with Dr. Ernst Bowler.  At this visit, patient having a asthma flare and started on a prednisone pack.  Continued on Singulair 10 mg daily and Symbicort 160,2 puffs twice a day.  Today presents for a acute visit. About a week ago, he started feeling run down and tired.  Having increased coughing and drainage. Saturday he was itching and having hives.  He has had hives before and has never been able to pinpoint an exact trigger. He has been coughing since, chest feels very heavy and burning.  At his last clinic visit a month ago he was having an asthma flare requiring steroids.  He feels that he needs steroids again at today's visit as his asthma feels uncontrolled. He is having a lot of mucus drainge and facial pressure around his eyes, sometimes on his forehead.  He has so much drainage and he can't tell if it from his lungs or his sinuses. He does get sinus infection usually around Spring and Fall, so this is not unusual for him. He has not had any COVID or flu testing.  Allergies as of 11/03/2021       Reactions   Other Hives, Swelling   Cashews         Medication List        Accurate as of November 03, 2021  5:46 PM. If you have any questions, ask your nurse or doctor.          azelastine 0.1 % nasal spray Commonly known as: ASTELIN INSTILL 2 SPRAYS INTO BOTH NOSTRILS TWICE DAILY   baclofen 10 MG tablet Commonly known as: LIORESAL Take 1 tablet by mouth 2 times a day as needed. Limit 1-2 treatments per week   busPIRone 5 MG tablet Commonly known as: BUSPAR TAKE 1 TABLET  BY MOUTH 3 TIMES DAILY (NEEDS APPT) What changed:  how much to take how to take this when to take this reasons to take this   cetirizine 10 MG tablet Commonly known as: ZYRTEC Take 10 mg by mouth daily.   dicyclomine 10 MG capsule Commonly known as: Bentyl Take 1 capsule (10 mg total) by mouth 3 (three) times daily before meals. What changed:  when to take this reasons to take this   diphenhydrAMINE 25 MG tablet Commonly known as: BENADRYL Take 50 mg by mouth daily as needed for itching.   EPINEPHrine 0.3 mg/0.3 mL Soaj injection Commonly known as: EPI-PEN Inject 0.3 mg into the muscle as needed for anaphylaxis.   hyoscyamine 0.125 MG tablet Commonly known as: LEVSIN Take 1 tablet (0.125 mg total) by mouth every 6 (six) hours as needed (abdominal pain).   levalbuterol 45 MCG/ACT inhaler Commonly known as: XOPENEX HFA Inhale 4 puffs by mouth every 4-6 hours as needed.   losartan-hydrochlorothiazide 100-25 MG tablet Commonly known as: HYZAAR Take 1 tablet by mouth daily.   montelukast 10 MG tablet Commonly known as: SINGULAIR Take 1 tablet (10 mg total) by mouth daily.   NP Thyroid 90 MG tablet Generic drug: thyroid Take 1 tablet (90 mg total) by mouth daily.   pantoprazole 40 MG tablet  Commonly known as: PROTONIX Take 1 tablet (40 mg total) by mouth daily.   simvastatin 40 MG tablet Commonly known as: ZOCOR Take 1 tablet (40 mg total) by mouth at bedtime.   Symbicort 160-4.5 MCG/ACT inhaler Generic drug: budesonide-formoterol INHALE 2 PUFFS INTO THE LUNGS TWO TIMES DAILY   Vitamin D3 125 MCG (5000 UT) Tabs Take 5,000 Units by mouth daily.   zonisamide 100 MG capsule Commonly known as: ZONEGRAN Take 1 capsule (100 mg total) by mouth daily.       Past Medical History:  Diagnosis Date   Asthma with allergic rhinitis 03/11/2008   Essential hypertension 05/06/2012   Gastroesophageal reflux disease with hiatal hernia 05/06/2012   Gastroesophageal reflux  disease with hiatal hernia    Hyperlipidemia LDL goal < 160 03/11/2008   Hypothyroidism 10/13/2021   Irritable bowel syndrome with constipation and diarrhea 05/06/2012   Migraine    Obesity (BMI 30.0-34.9) 03/03/2013   Obstructive sleep apnea 11/19/2008   Nocturnal polysomnography 12/14/2008: AHI 20.8, O2 Sat nadir 84%, optimal CPAP 12 cm H2O    Urticaria    Past Surgical History:  Procedure Laterality Date   COLONOSCOPY WITH PROPOFOL N/A 08/06/2021   Procedure: COLONOSCOPY WITH PROPOFOL;  Surgeon: Harvel Quale, MD;  [facility];  Service: Gastroenterology;  Laterality: N/A;  915   POLYPECTOMY  08/06/2021   Procedure: POLYPECTOMY;  Surgeon: Montez Morita, Quillian Quince, MD;  Location: [facility];  Service: Gastroenterology;;  cecum and ascending   URETHRAL DILATION     Otherwise, there have been no changes to his past medical history, surgical history, family history, or social history.  ROS: All others negative except as noted per HPI.   Objective:  BP 98/70    Pulse (!) 120    Temp 98.3 F (36.8 C) (Temporal)    Resp 16    Ht 5\' 10"  (1.778 m)    Wt 228 lb 12.8 oz (103.8 kg)    SpO2 96%    BMI 32.83 kg/m  Body mass index is 32.83 kg/m. Physical Exam: General Appearance:  Alert, cooperative, no distress, appears stated age  Head:  Normocephalic, without obvious abnormality, atraumatic  Eyes:  Conjunctiva clear, EOM's intact  Nose: Nares normal, hypertrophic turbinates, normal mucosa, no visible anterior polyps, and septum midline  Throat: Lips, tongue normal; teeth and gums normal, normal posterior oropharynx, no tonsillar exudate, and + cobblestoning  Neck: Supple, symmetrical  Lungs:   clear to auscultation bilaterally, Respirations unlabored, intermittent dry coughing  Heart:  regular rate and rhythm and no murmur, Appears well perfused  Extremities: No edema  Skin: Skin color, texture, turgor normal, no rashes or lesions on visualized portions of skin   Neurologic: No gross deficits  Spirometry:  Deferred today   Assessment/Plan  Based on duration of symptoms and history, will treat for acute bacterial sinusitis with concomitant asthma flare.  He has had 2 courses of steroids in the past few months for asthma flares.  We discussed asthma Biologics and have ordered testing for that today.  Also advised switching from Symbicort 160 to Breztri 2 puffs twice a day until his next follow-up. Patient Instructions   Sinus infection with asthma flare:  - Covid testing - prednisone burst (pack provided in clinic) -Labs to determine candidacy for asthma biologics - Augmentin 500 mg twice daily for 7 days - Start Breztri 2 puffs twice a day and use in place of symbicort 160, continue until your next office visit since you  have needed 2 bursts of steroids back to back - use scheduled albuterol every 6 hours for the next 2 to 3 days  2. Moderate persistent asthma  - We will get your breathing under better control before we start the allergy shots again.  - Daily controller medication(s): Singulair 10mg  daily and Breztri two puffs twice daily with spacer, continue until your next office visit - Prior to physical activity: Xopenex 2 puffs 10-15 minutes before physical activity. - Rescue medications: Xopenex 4 puffs every 4-6 hours as needed - Asthma control goals:  * Full participation in all desired activities (may need albuterol before activity) * Albuterol use two time or less a week on average (not counting use with activity) * Cough interfering with sleep two time or less a month * Oral steroids no more than once a year * No hospitalizations  2. Chronic rhinitis (grasses, ragweed, weeds, trees, indoor molds, outdoor molds and cat) - Continue with: Singulair (montelukast) 10mg  daily and Astelin (azelastine) two sprays per nostril daily and Allegra (fexofenadine) 180mg  table once daily and Astelin (azelastine) 2 sprays per nostril 1-2 times daily  as needed - hold on allergy injections until your asthma is better controlled   3. 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   4. Follow-up en 6 to 8 weeks, sooner if needed.   Please inform us of any Emergency Department visits, hospitalizations, or changes in symptoms. Call us before going to the ED for breathing or allergy symptoms since we might be able to fit you in for a sick visit. Feel free to contact us anytime with any questions, problems, or concerns.  It was a pleasure to meet you today!  Websites that have reliable patient information: 1. American Academy of Asthma, Allergy, and Immunology: www.aaaai.org 2. Food Allergy Research and Education (FARE): foodallergy.org 3. Mothers of Asthmatics: http://www.asthmacommunitynetwork.org 4. American College of Allergy, Asthma, and Imm    Sigurd Sos, MD  [facility]

## 2021-11-04 ENCOUNTER — Other Ambulatory Visit: Payer: Self-pay | Admitting: Internal Medicine

## 2021-11-04 ENCOUNTER — Other Ambulatory Visit (HOSPITAL_COMMUNITY): Payer: Self-pay

## 2021-11-04 MED ORDER — PREDNISONE 20 MG PO TABS: 20.0000 mg | ORAL_TABLET | Freq: Every day | ORAL | 0 refills | Status: AC

## 2021-11-04 MED ORDER — PREDNISONE 20 MG PO TABS
20.0000 mg | ORAL_TABLET | Freq: Every day | ORAL | 0 refills | Status: DC
  Filled 2021-11-04: qty 5, 5d supply, fill #0

## 2021-11-04 NOTE — Telephone Encounter (Signed)
I do see the pred burst on the avs please advise to a day pack or baby pack

## 2021-11-04 NOTE — Addendum Note (Signed)
Addended by: Herbie Drape on: 11/04/2021 07:30 PM   Modules accepted: Orders

## 2021-11-04 NOTE — Progress Notes (Signed)
Encounter created to send prednisone burst for asthma flare.

## 2021-11-05 ENCOUNTER — Other Ambulatory Visit (HOSPITAL_COMMUNITY): Payer: Self-pay

## 2021-11-06 ENCOUNTER — Encounter: Payer: Self-pay | Admitting: Internal Medicine

## 2021-11-06 ENCOUNTER — Other Ambulatory Visit (HOSPITAL_COMMUNITY): Payer: Self-pay

## 2021-11-06 ENCOUNTER — Telehealth: Payer: Self-pay

## 2021-11-06 ENCOUNTER — Other Ambulatory Visit: Payer: Self-pay | Admitting: *Deleted

## 2021-11-06 DIAGNOSIS — R5383 Other fatigue: Secondary | ICD-10-CM

## 2021-11-06 DIAGNOSIS — E782 Mixed hyperlipidemia: Secondary | ICD-10-CM

## 2021-11-06 DIAGNOSIS — E559 Vitamin D deficiency, unspecified: Secondary | ICD-10-CM

## 2021-11-06 DIAGNOSIS — F419 Anxiety disorder, unspecified: Secondary | ICD-10-CM

## 2021-11-06 MED ORDER — BUSPIRONE HCL 5 MG PO TABS
ORAL_TABLET | ORAL | 0 refills | Status: DC
  Filled 2021-11-06: qty 90, 30d supply, fill #0

## 2021-11-06 NOTE — Telephone Encounter (Signed)
LVM for pt to call me back to schedule sleep study  

## 2021-11-08 LAB — ALLERGENS W/TOTAL IGE AREA 2

## 2021-11-08 LAB — CBC WITH DIFFERENTIAL/PLATELET
Basophils Absolute: 0.1 10*3/uL (ref 0.0–0.2)
Basos: 1 %
EOS (ABSOLUTE): 0.1 10*3/uL (ref 0.0–0.4)
Eos: 1 %
Hematocrit: 48.8 % (ref 37.5–51.0)
Hemoglobin: 16.6 g/dL (ref 13.0–17.7)
Immature Grans (Abs): 0 10*3/uL (ref 0.0–0.1)
Immature Granulocytes: 0 %
Lymphocytes Absolute: 1.2 10*3/uL (ref 0.7–3.1)
Lymphs: 14 %
MCH: 28.2 pg (ref 26.6–33.0)
MCHC: 34 g/dL (ref 31.5–35.7)
MCV: 83 fL (ref 79–97)
Monocytes Absolute: 1.1 10*3/uL — ABNORMAL HIGH (ref 0.1–0.9)
Monocytes: 12 %
Neutrophils Absolute: 6.5 10*3/uL (ref 1.4–7.0)
Neutrophils: 72 %
Platelets: 284 10*3/uL (ref 150–450)
RBC: 5.88 x10E6/uL — ABNORMAL HIGH (ref 4.14–5.80)
RDW: 13.4 % (ref 11.6–15.4)
WBC: 9 10*3/uL (ref 3.4–10.8)

## 2021-11-10 ENCOUNTER — Encounter (INDEPENDENT_AMBULATORY_CARE_PROVIDER_SITE_OTHER): Payer: Self-pay | Admitting: Gastroenterology

## 2021-11-10 ENCOUNTER — Other Ambulatory Visit: Payer: Self-pay

## 2021-11-10 ENCOUNTER — Ambulatory Visit (INDEPENDENT_AMBULATORY_CARE_PROVIDER_SITE_OTHER): Payer: 59 | Admitting: Gastroenterology

## 2021-11-10 VITALS — BP 115/79 | HR 99 | Temp 98.0°F | Ht 70.0 in | Wt 228.1 lb

## 2021-11-10 DIAGNOSIS — K582 Mixed irritable bowel syndrome: Secondary | ICD-10-CM

## 2021-11-10 NOTE — Patient Instructions (Signed)
Continue implementing low FODMAP diet Start IBGard 1 tablet every or edible peppermint 8 hours as needed for abdominal pain or bloating Continue Levsin as needed Can take probiotics daily (can try Align or VSL3)

## 2021-11-10 NOTE — Progress Notes (Signed)
Sergio [name], M.D. Gastroenterology & Hepatology [facility] [address] [place], [place] [address]  Primary Care Physician: Lindell Spar, MD [address] [place] [address]  I will communicate my assessment and recommendations to the referring MD via EMR.  Problems: IBS-M  History of Present Illness: Sergio [name] is a 46 y.o. male with past medical history of GERD, hyperlipidemia, IBS-M, migraine, obesity, OSA, asthma, hypertension, who presents for follow up of IBS-M.  The patient was last seen on 07/10/21. At that time, the patient was scheduled for screening colonoscopy.  The patient was counseled to implement the low FODMAP diet.  Celiac serology was checked and it was negative.  He was also advised to take Bentyl as needed for abdominal pain and IBgard as needed for bloating.Colonoscopy was performed on 08/06/2021 which showed presence of 4 polyps between 2 to 10 mm in the cecum and ascending colon which were removed with forceps and cold snare, there was presence of diverticulosis.  Nonbleeding hemorrhoids.  Pathology showed histology was consistent with tubular adenomas, he was counseled to repeat a colonoscopy in 5 years.  Patient reports he has on average 3-4 times a week diarrhea on multiple occasions. He reports most of the times the stool is soft but sometimes it is watery. No melena or hematochezia. He also has episodes of cramping, which can be very severe sometimes although the severity ahs decreased recently. He takes the Levsin 3-4 times a week. The reports that the pain improves but he states feeling bloated despite taking the medication.  He is trying to implement the low FODMAP diet as much as possible.  Has never tried peppermint or IBGard or probiotics.  The patient denies having any nausea, vomiting, fever, chills, hematochezia, melena, hematemesis, abdominal distention, jaundice, pruritus . Has lost  some lb with his current diet.  Last EGD: 2005 by Dr. Henrene Pastor, presence of mild reflux esophagitis, has small hiatal hernia with rest of the examination within normal limits.  He was prescribed Librax for management of abdominal pain. Last Colonoscopy: As above  Past Medical History: Past Medical History:  Diagnosis Date   Asthma with allergic rhinitis 03/11/2008   Essential hypertension 05/06/2012   Gastroesophageal reflux disease with hiatal hernia 05/06/2012   Gastroesophageal reflux disease with hiatal hernia    Hyperlipidemia LDL goal < 160 03/11/2008   Hypothyroidism 10/13/2021   Irritable bowel syndrome with constipation and diarrhea 05/06/2012   Migraine    Obesity (BMI 30.0-34.9) 03/03/2013   Obstructive sleep apnea 11/19/2008   Nocturnal polysomnography 12/14/2008: AHI 20.8, O2 Sat nadir 84%, optimal CPAP 12 cm H2O    Urticaria     Past Surgical History: Past Surgical History:  Procedure Laterality Date   COLONOSCOPY WITH PROPOFOL N/A 08/06/2021   Procedure: COLONOSCOPY WITH PROPOFOL;  Surgeon: Harvel Quale, MD;  [facility];  Service: Gastroenterology;  Laterality: N/A;  915   POLYPECTOMY  08/06/2021   Procedure: POLYPECTOMY;  Surgeon: Harvel Quale, MD;  [facility];  Service: Gastroenterology;;  cecum and ascending   URETHRAL DILATION      Family History: Family History  Problem Relation Age of Onset   Early death Father        Accident   Hypertension Maternal Grandmother    Vaginal cancer Maternal Grandmother    Parkinson's disease Maternal Grandmother    Diabetes Paternal Grandfather    ALS Paternal Grandfather    Breast cancer  Paternal Grandmother    Kidney cancer Paternal Grandmother    Healthy Mother    Healthy Sister    Healthy Sister    Hypertension Maternal Uncle    Diabetes Paternal Aunt    COPD Maternal Grandfather    Urticaria Neg Hx    Immunodeficiency Neg Hx    Eczema Neg Hx    Atopy Neg Hx     Angioedema Neg Hx    Allergic rhinitis Neg Hx    Asthma Neg Hx     Social History: Social History   Tobacco Use  Smoking Status Never  Smokeless Tobacco Never   Social History   Substance and Sexual Activity  Alcohol Use Yes   Comment: occ   Social History   Substance and Sexual Activity  Drug Use No    Allergies: Allergies  Allergen Reactions   Other Hives and Swelling    Cashews     Medications: Current Outpatient Medications  Medication Sig Dispense Refill   amoxicillin-clavulanate (AUGMENTIN) 500-125 MG tablet Take 1 tablet by mouth in the morning and at bedtime for 7 days. 14 tablet 0   Azelastine HCl 137 MCG/SPRAY SOLN INSTILL 2 SPRAYS INTO BOTH NOSTRILS TWICE DAILY 90 mL 1   baclofen (LIORESAL) 10 MG tablet Take 1 tablet by mouth 2 times a day as needed. Limit 1-2 treatments per week 20 tablet 1   Budeson-Glycopyrrol-Formoterol (BREZTRI AEROSPHERE) 160-9-4.8 MCG/ACT AERO Inhale 2 puffs into the lungs in the morning and at bedtime. 10.7 g 3   budesonide-formoterol (SYMBICORT) 160-4.5 MCG/ACT inhaler INHALE 2 PUFFS INTO THE LUNGS TWO TIMES DAILY 10.2 g 5   busPIRone (BUSPAR) 5 MG tablet TAKE 1 TABLET BY MOUTH 3 TIMES DAILY (NEEDS APPT) 90 tablet 0   cetirizine (ZYRTEC) 10 MG tablet Take 10 mg by mouth daily.     Cholecalciferol (VITAMIN D3) 125 MCG (5000 UT) TABS Take 5,000 Units by mouth daily.     diphenhydrAMINE (BENADRYL) 25 MG tablet Take 50 mg by mouth daily as needed for itching.     EPINEPHrine 0.3 mg/0.3 mL IJ SOAJ injection Inject 0.3 mg into the muscle as needed for anaphylaxis. 2 each 1   hyoscyamine (LEVSIN) 0.125 MG tablet Take 1 tablet (0.125 mg total) by mouth every 6 (six) hours as needed for abdominal pain. 30 tablet 3   levalbuterol (XOPENEX HFA) 45 MCG/ACT inhaler Inhale 4 puffs by mouth every 4-6 hours as needed. 15 g 1   losartan-hydrochlorothiazide (HYZAAR) 100-25 MG tablet Take 1 tablet by mouth daily. 90 tablet 1   montelukast (SINGULAIR)  10 MG tablet Take 1 tablet (10 mg total) by mouth daily. 90 tablet 1   pantoprazole (PROTONIX) 40 MG tablet Take 1 tablet (40 mg total) by mouth daily. 90 tablet 1   simvastatin (ZOCOR) 40 MG tablet Take 1 tablet (40 mg total) by mouth at bedtime. 90 tablet 1   thyroid (NP THYROID) 90 MG tablet Take 1 tablet (90 mg total) by mouth daily. 90 tablet 1   zonisamide (ZONEGRAN) 100 MG capsule Take 1 capsule (100 mg total) by mouth daily. 30 capsule 2   No current facility-administered medications for this visit.    Review of Systems: GENERAL: negative for malaise, night sweats HEENT: No changes in hearing or vision, no nose bleeds or other nasal problems. NECK: Negative for lumps, goiter, pain and significant neck swelling RESPIRATORY: Negative for cough, wheezing CARDIOVASCULAR: Negative for chest pain, leg swelling, palpitations, orthopnea GI: SEE HPI MUSCULOSKELETAL: Negative for  joint pain or swelling, back pain, and muscle pain. SKIN: Negative for lesions, rash PSYCH: Negative for sleep disturbance, mood disorder and recent psychosocial stressors. HEMATOLOGY Negative for prolonged bleeding, bruising easily, and swollen nodes. ENDOCRINE: Negative for cold or heat intolerance, polyuria, polydipsia and goiter. NEURO: negative for tremor, gait imbalance, syncope and seizures. The remainder of the review of systems is noncontributory.   Physical Exam: BP 115/79 (BP Location: Left Arm, Patient Position: Sitting, Cuff Size: Large)    Pulse 99    Temp 98 F (36.7 C) (Oral)    Ht 5\' 10"  (1.778 m)    Wt 228 lb 1.6 oz (103.5 kg)    BMI 32.73 kg/m  GENERAL: The patient is AO x3, in no acute distress. Obese. HEENT: Head is normocephalic and atraumatic. EOMI are intact. Mouth is well hydrated and without lesions. NECK: Supple. No masses LUNGS: Clear to auscultation. No presence of rhonchi/wheezing/rales. Adequate chest expansion HEART: RRR, normal s1 and s2. ABDOMEN: Soft, nontender, no guarding,  no peritoneal signs, and nondistended. BS +. No masses. EXTREMITIES: Without any cyanosis, clubbing, rash, lesions or edema. NEUROLOGIC: AOx3, no focal motor deficit. SKIN: no jaundice, no rashes  Imaging/Labs: as above  I personally reviewed and interpreted the available labs, imaging and endoscopic files.  Impression and Plan: Sergio [name] is a 46 y.o. male with past medical history of GERD, hyperlipidemia, IBS-M, migraine, obesity, OSA, asthma, hypertension, who presents for follow up of IBS-M.  The patient has presented improvement of his symptoms of IBS with the use of antispasmodics, he will benefit from taking these medications up to every 6 hours if needed to relieve the abdominal pain episodes.  He will benefit from continuing the use of low FODMAP diet but I advised him to start taking IBgard or edible peppermint as needed for bloating as he has not implemented this yet.  He will also benefit from taking probiotics on a daily basis.  Patient understood and agreed.  - Continue implementing low FODMAP diet - Start IBGard 1 tablet every or edible peppermint 8 hours as needed for abdominal pain or bloating - Continue Levsin as needed - Can take probiotics daily  All questions were answered.      Harvel Quale, MD Gastroenterology and Hepatology [facility]

## 2021-11-14 ENCOUNTER — Other Ambulatory Visit: Payer: Self-pay

## 2021-11-14 ENCOUNTER — Ambulatory Visit: Payer: 59 | Admitting: Neurology

## 2021-11-14 ENCOUNTER — Ambulatory Visit
Admission: RE | Admit: 2021-11-14 | Discharge: 2021-11-14 | Disposition: A | Discharge status: Home / Self Care | Payer: 59 | Source: Ambulatory Visit | Attending: Allergy & Immunology | Admitting: Allergy & Immunology

## 2021-11-14 ENCOUNTER — Other Ambulatory Visit (HOSPITAL_COMMUNITY): Payer: Self-pay

## 2021-11-14 DIAGNOSIS — G4719 Other hypersomnia: Secondary | ICD-10-CM

## 2021-11-14 DIAGNOSIS — R519 Headache, unspecified: Secondary | ICD-10-CM

## 2021-11-14 DIAGNOSIS — R351 Nocturia: Secondary | ICD-10-CM

## 2021-11-14 DIAGNOSIS — R0602 Shortness of breath: Secondary | ICD-10-CM | POA: Diagnosis not present

## 2021-11-14 DIAGNOSIS — G4733 Obstructive sleep apnea (adult) (pediatric): Secondary | ICD-10-CM | POA: Diagnosis not present

## 2021-11-14 DIAGNOSIS — E669 Obesity, unspecified: Secondary | ICD-10-CM

## 2021-11-14 DIAGNOSIS — G4720 Circadian rhythm sleep disorder, unspecified type: Secondary | ICD-10-CM

## 2021-11-14 DIAGNOSIS — R635 Abnormal weight gain: Secondary | ICD-10-CM

## 2021-11-14 MED ORDER — LEVOFLOXACIN 500 MG PO TABS
500.0000 mg | ORAL_TABLET | Freq: Every day | ORAL | 0 refills | Status: AC
  Filled 2021-11-14: qty 10, 10d supply, fill #0

## 2021-11-19 ENCOUNTER — Telehealth: Payer: Self-pay | Admitting: *Deleted

## 2021-11-19 NOTE — Telephone Encounter (Signed)
-----   Message from Star Age, MD sent at 11/19/2021  8:00 AM EST ----- ?Patient referred by Dr. Ernst Bowler, I saw him on 10/23/21. Pt had a split study on 11/14/21.  ?Please call and inform patient that I have entered an order for treatment with positive airway pressure (PAP) treatment for obstructive sleep apnea (OSA). He qualified for CPAP during this study and did well with CPAP. He probably has no DME at this time. We will send the order to a DME of his choice. We may use [employer] or [facility]. We will see the patient back in follow-up in about 10 weeks. Please also explain to the patient that I will be looking out for compliance data, which can be downloaded from the machine (stored on an SD card, that is inserted in the machine) or via remote access through a modem, that is built into the machine. At the time of the followup appointment we will discuss sleep study results and how it is going with PAP treatment at home. Please advise patient to bring His machine at the time of the first FU visit, even though this is cumbersome. Bringing the machine for every visit after that will likely not be needed, but often helps for the first visit to troubleshoot if needed. Please re-enforce the importance of compliance with treatment and the need for [place] to monitor compliance data - often an insurance requirement and actually good feedback for the patient as far as how they are doing.  ?Also remind patient, that any interim PAP machine or mask issues should be first addressed with the DME company, as they can often help better with technical and mask fit issues. Please ask if patient has a preference regarding DME company. ? ?Please also make sure, the patient has a follow-up appointment with me in about 10 weeks from the setup date, thanks. May see one of our nurse practitioners if needed for proper timing of the FU appointment.  ?Please fax or rout report to the referring provider. Thanks,  ? ?Star Age, MD, PhD ?[facility])  ? ? ?

## 2021-11-19 NOTE — Procedures (Signed)
PATIENT'S NAME:  Sergio [name], Sergio [name] ?DOB:      1976-06-11      ?MR#:    [id number]     ?DATE OF RECORDING: 11/14/2021 ?REFERRING M.D.:  Valentina Shaggy, MD ?Study Performed:  Split-Night Titration Study ?HISTORY: 46 year old man with a history of asthma, allergic rhinitis, reflux disease, hypothyroidism, migraine headaches, hyperlipidemia, hypertension and obesity, who was previously diagnosed with obstructive sleep apnea and tried CPAP therapy in the past. He is no longer on CPAP treatment. He presents re-evaluation. His Epworth sleepiness score is 19 out of 24. The patient's weight 226 pounds with a height of 70 (inches), resulting in a BMI of 32.5 kg/m2. The patient's neck circumference measured 18.4 inches. ? ?CURRENT MEDICATIONS: Lioresal, Symbicort, Azelastine HCI, Buspar, Zyrtec, Vitamin D3, Bentyl, Benadryl, Epinephrine, Levsin, Xopenex, Hyzaar, Singuliar, Protonix, Zocor, NP Thyroid, Zonegran ? ?PROCEDURE:  This is a multichannel digital polysomnogram utilizing the Somnostar 11.2 system.  Electrodes and sensors were applied and monitored per AASM Specifications.   EEG, EOG, Chin and Limb EMG, were sampled at 200 Hz.  ECG, Snore and Nasal Pressure, Thermal Airflow, Respiratory Effort, CPAP Flow and Pressure, Oximetry was sampled at 50 Hz. Digital video and audio were recorded.     ? ?BASELINE STUDY WITHOUT CPAP RESULTS: ? ?Lights Out was at 21:44 and Lights On at 04:59 for the night. He met emergency split criteria and PAP was started at 00:12, epoch 304. Total recording time (TRT) was 148, with a total sleep time (TST) of 125.5 minutes.   The patient's sleep latency was 9 minutes.  REM sleep was absent. The sleep efficiency was 84.8%.  ?  ?SLEEP ARCHITECTURE: WASO (Wake after sleep onset) was 7.5 minutes, Stage N1 was 4 minutes, Stage N2 was 121.5 minutes, Stage N3 was 0 minutes and Stage R (REM sleep) was 0 minutes.  The percentages were Stage N1 3.2%, Stage N2 96.8%, Stage N3 and Stage R were absent in this  part of the study. The arousals were noted as: 9 were spontaneous, 0 were associated with PLMs, 150 were associated with respiratory events. ? ?RESPIRATORY ANALYSIS:  There were a total of 186 respiratory events:  163 obstructive apneas, 0 central apneas and 3 mixed apneas with a total of 166 apneas and an apnea index (AI) of 79.4. There were 20 hypopneas with a hypopnea index of 9.6. The patient also had 0 respiratory event related arousals (RERAs).  ?Snoring was noted. ?    ?The total APNEA/HYPOPNEA INDEX (AHI) was 88.9/hour and the total RESPIRATORY DISTURBANCE INDEX was 88.9 /hour.  0 events occurred in REM sleep and 43 events in NREM. The REM AHI was 0, /hour versus a non-REM AHI of 88.9 /hour. The patient spent 243.5 minutes sleep time in the supine position 146 minutes in non-supine. The supine AHI was 93.5 /hour versus a non-supine AHI of 45.0 /hour. ? ?OXYGEN SATURATION & C02:  The wake baseline 02 saturation was 90%, with the lowest being 66%. Time spent below 89% saturation equaled 99 minutes. ?PERIODIC LIMB MOVEMENTS: The patient had a total of 0 Periodic Limb Movements.  The Periodic Limb Movement (PLM) index was 0 /hour and the PLM Arousal index was 0 /hour. ? ?Audio and video analysis did not show any abnormal or unusual movements, behaviors, phonations or vocalizations. The patient took no bathroom breaks. Moderate to loud snoring was noted. The EKG was in keeping with normal sinus rhythm (NSR) ? ?TITRATION STUDY WITH CPAP RESULTS: ?  ?The patient was fitted with  a small Vitera FFM from [employer]. CPAP was initiated at 5 cmH20 with heated humidity per AASM split night standards and pressure was advanced to 10 cmH20 because of hypopneas, apneas and desaturations.  At a PAP pressure of 10 cmH20, there was a reduction of the AHI to 0.8/hour with supine REM sleep achieved and O2 nadir of 88%.  ? ?Total recording time (TRT) was 288 minutes, with a total sleep time (TST) of 263.5 minutes. The patient's sleep  latency was 20 minutes. REM latency was 59.5 minutes.  The sleep efficiency was 91.5 %.   ? ?SLEEP ARCHITECTURE: Wake after sleep was 14 minutes, Stage N1 8 minutes, Stage N2 164.5 minutes, Stage N3 31 minutes and Stage R (REM sleep) 60 minutes. The percentages were: Stage N1 3.%, Stage N2 62.4%, Stage N3 11.8% and Stage R (REM sleep) 22.8%. The arousals were noted as: 36 were spontaneous, 0 were associated with PLMs, 3 were associated with respiratory events. ? ?RESPIRATORY ANALYSIS:  There were a total of 11 respiratory events: 2 obstructive apneas, 1 central apneas and 2 mixed apneas with a total of 5 apneas and an apnea index (AI) of 1.1. There were 6 hypopneas with a hypopnea index of 1.4 /hour. The patient also had 1 respiratory event related arousals (RERAs).     ? ?The total APNEA/HYPOPNEA INDEX  (AHI) was 2.5 /hour and the total RESPIRATORY DISTURBANCE INDEX was 2.7 /hour.  1 events occurred in REM sleep and 10 events in NREM. The REM AHI was 1 /hour versus a non-REM AHI of 2.9 /hour. REM sleep was achieved on a pressure of  cm/h2o (AHI was  .) The patient spent 49% of total sleep time in the supine position. The supine AHI was 3.7 /hour, versus a non-supine AHI of 1.3/hour. ? ?OXYGEN SATURATION & C02:  The wake baseline 02 saturation was 96%, with the lowest being 88%. Time spent below 89% saturation equaled 0 minutes. ? ?PERIODIC LIMB MOVEMENTS:    ?The patient had a total of 0 Periodic Limb Movements. The Periodic Limb Movement (PLM) index was 0 /hour and the PLM Arousal index was 0 /hour. ? ?Post-study, the patient indicated that sleep was better than usual. ? ?POLYSOMNOGRAPHY IMPRESSION :  ? ?Severe Obstructive Sleep Apnea (OSA)  ?Dysfunctions associated with sleep stages or arousals from sleep ?RECOMMENDATIONS: ? ?This patient has severe obstructive sleep apnea. The absence of REM sleep in the diagnostic portion of the study may have underestimated his sleep disordered breathing. He qualified for an  emergency split test and responded rather well on CPAP therapy. I would recommend a home treatment pressure of 11 cm, due to an O2 nadir of 88% on a treatment pressure of 10 cm. The patient will be advised to be fully compliant with PAP therapy to improve sleep related symptoms and decrease long term cardiovascular risks. Please note that untreated obstructive sleep apnea may carry additional perioperative morbidity. Patients with significant obstructive sleep apnea should receive perioperative PAP therapy and the surgeons and particularly the anesthesiologist should be informed of the diagnosis and the severity of the sleep disordered breathing. ?This study shows sleep fragmentation and abnormal sleep stage percentages; these are nonspecific findings and per se do not signify an intrinsic sleep disorder or a cause for the patient's sleep-related symptoms. Causes include (but are not limited to) the first night effect of the sleep study, circadian rhythm disturbances, medication effect or an underlying mood disorder or medical problem.  ?The patient should be cautioned not  to drive, work at heights, or operate dangerous or heavy equipment when tired or sleepy. Review and reiteration of good sleep hygiene measures should be pursued with any patient. ?The patient will be seen in follow-up in the sleep clinic at [facility] for discussion of the test results, symptom and treatment compliance review, further management strategies, etc. The referring provider will be notified of the test results. ? ?I certify that I have reviewed the entire raw data recording prior to the issuance of this report in accordance with the Standards of Accreditation of the [place] [employer] (AASM) ? ?Saima Athar,MD,PhD ?Diplomat, American Board of Neurology and Sleep Medicine (Neurology and Sleep Medicine) ? ? ? ? ?

## 2021-11-19 NOTE — Addendum Note (Signed)
Addended by: Star Age on: 11/19/2021 08:00 AM ? ? Modules accepted: Orders ? ?

## 2021-11-19 NOTE — Telephone Encounter (Signed)
Spoke with the patient and discussed his sleep study results.  Patient understands he did well on CPAP during the sleep study.  We discussed options for DME company, he would like to use [employer] as it is closest to his home.  Also discussed insurance compliance requirements which includes using the machine at least 4 hours at night and also being scheduled for an initial follow-up appointment between 31 and 90 days after set up.  We went ahead and scheduled an appointment with Dr. Rexene Alberts for Tuesday May 30th at 1:15 PM arrival 15-30 minutes and to bring his machine & power cord. Pt's questions were answered. He verbalized appreciation for the call.  ? ?Order, sleep study, office note, and insurance info sent to [employer]. Received a receipt of confirmation. ? ?

## 2021-12-03 ENCOUNTER — Other Ambulatory Visit (HOSPITAL_COMMUNITY): Payer: Self-pay

## 2021-12-16 ENCOUNTER — Ambulatory Visit: Payer: 59 | Admitting: Allergy & Immunology

## 2021-12-16 DIAGNOSIS — J309 Allergic rhinitis, unspecified: Secondary | ICD-10-CM

## 2021-12-23 ENCOUNTER — Telehealth: Payer: Self-pay | Admitting: *Deleted

## 2021-12-23 MED ORDER — TEZSPIRE 210 MG/1.91ML ~~LOC~~ SOSY
210.0000 mg | PREFILLED_SYRINGE | Freq: Once | SUBCUTANEOUS | 11 refills | Status: DC
  Filled 2021-12-23: qty 1.91, 1d supply, fill #0

## 2021-12-23 NOTE — Telephone Encounter (Signed)
L/M for patient to contact me regarding Tezspire. Approval done after having to appeal, signed up for copay card and will send Erx to [place] an reach out to patient to advise delivery to start therapy ?

## 2021-12-23 NOTE — Telephone Encounter (Signed)
Spoke to patient and advised approval and submit to [place] and copay card to his email. I will reach out once delivery set to office to schedule start of therapy ?

## 2021-12-24 ENCOUNTER — Other Ambulatory Visit (HOSPITAL_COMMUNITY): Payer: Self-pay

## 2021-12-25 ENCOUNTER — Other Ambulatory Visit (HOSPITAL_COMMUNITY): Payer: Self-pay

## 2021-12-29 ENCOUNTER — Other Ambulatory Visit (HOSPITAL_COMMUNITY): Payer: Self-pay

## 2021-12-31 ENCOUNTER — Other Ambulatory Visit (HOSPITAL_COMMUNITY): Payer: Self-pay

## 2022-01-05 ENCOUNTER — Other Ambulatory Visit (HOSPITAL_COMMUNITY): Payer: Self-pay

## 2022-01-05 DIAGNOSIS — G43901 Migraine, unspecified, not intractable, with status migrainosus: Secondary | ICD-10-CM | POA: Diagnosis not present

## 2022-01-05 DIAGNOSIS — M791 Myalgia, unspecified site: Secondary | ICD-10-CM | POA: Diagnosis not present

## 2022-01-05 DIAGNOSIS — G43719 Chronic migraine without aura, intractable, without status migrainosus: Secondary | ICD-10-CM | POA: Diagnosis not present

## 2022-01-05 DIAGNOSIS — M542 Cervicalgia: Secondary | ICD-10-CM | POA: Diagnosis not present

## 2022-01-05 MED ORDER — ZONISAMIDE 100 MG PO CAPS
100.0000 mg | ORAL_CAPSULE | Freq: Every day | ORAL | 2 refills | Status: DC
  Filled 2022-01-05: qty 30, 30d supply, fill #0

## 2022-01-05 MED ORDER — BACLOFEN 10 MG PO TABS
ORAL_TABLET | ORAL | 1 refills | Status: DC
  Filled 2022-01-05: qty 20, 30d supply, fill #0

## 2022-01-07 ENCOUNTER — Other Ambulatory Visit (HOSPITAL_COMMUNITY): Payer: Self-pay

## 2022-01-12 ENCOUNTER — Other Ambulatory Visit: Payer: Self-pay | Admitting: Internal Medicine

## 2022-01-12 ENCOUNTER — Other Ambulatory Visit (HOSPITAL_COMMUNITY): Payer: Self-pay

## 2022-01-12 DIAGNOSIS — F419 Anxiety disorder, unspecified: Secondary | ICD-10-CM

## 2022-01-12 DIAGNOSIS — R5383 Other fatigue: Secondary | ICD-10-CM

## 2022-01-12 DIAGNOSIS — E782 Mixed hyperlipidemia: Secondary | ICD-10-CM

## 2022-01-12 DIAGNOSIS — E559 Vitamin D deficiency, unspecified: Secondary | ICD-10-CM

## 2022-01-12 MED ORDER — BUSPIRONE HCL 5 MG PO TABS
ORAL_TABLET | ORAL | 0 refills | Status: DC
  Filled 2022-01-12: qty 90, 30d supply, fill #0

## 2022-01-21 ENCOUNTER — Other Ambulatory Visit (HOSPITAL_COMMUNITY): Payer: Self-pay

## 2022-02-03 ENCOUNTER — Other Ambulatory Visit (HOSPITAL_COMMUNITY): Payer: Self-pay

## 2022-02-05 ENCOUNTER — Telehealth: Payer: Self-pay | Admitting: *Deleted

## 2022-02-05 NOTE — Telephone Encounter (Signed)
Returning call to [place] about this pt . They did reach out to patient after getting order , spoke to him 11-25-2021 regarding set up.  They needed to know where he got cpap before, what insurance.  Pt would get back to him and has not. So is pending.

## 2022-02-05 NOTE — Telephone Encounter (Signed)
Tried to reach pt but his VM was full. Spoke with pt's wife, [place] (on [place]). Initial f/u for Tuesday 5/30 has been canceled. I asked her to remind pt we would like for him to pursue CPAP and to call [facility] to provide missing info so they can process order. Also asked for her to have pt call us when he is setup on his new machine so we can reschedule this appointment. Pt's wife verbalized understanding and was appreciated the call.

## 2022-02-10 ENCOUNTER — Ambulatory Visit: Payer: 59 | Admitting: Neurology

## 2022-02-13 ENCOUNTER — Other Ambulatory Visit: Payer: Self-pay

## 2022-02-15 ENCOUNTER — Other Ambulatory Visit: Payer: Self-pay | Admitting: Internal Medicine

## 2022-02-15 DIAGNOSIS — E559 Vitamin D deficiency, unspecified: Secondary | ICD-10-CM

## 2022-02-15 DIAGNOSIS — R5383 Other fatigue: Secondary | ICD-10-CM

## 2022-02-15 DIAGNOSIS — E782 Mixed hyperlipidemia: Secondary | ICD-10-CM

## 2022-02-15 DIAGNOSIS — F419 Anxiety disorder, unspecified: Secondary | ICD-10-CM

## 2022-02-16 ENCOUNTER — Ambulatory Visit: Payer: 59 | Attending: Internal Medicine | Admitting: Pharmacist

## 2022-02-16 ENCOUNTER — Other Ambulatory Visit (HOSPITAL_COMMUNITY): Payer: Self-pay

## 2022-02-16 DIAGNOSIS — Z79899 Other long term (current) drug therapy: Secondary | ICD-10-CM

## 2022-02-16 MED ORDER — TEZSPIRE 210 MG/1.91ML ~~LOC~~ SOSY
210.0000 mg | PREFILLED_SYRINGE | Freq: Once | SUBCUTANEOUS | 11 refills | Status: AC
  Filled 2022-02-16: qty 1.91, 28d supply, fill #0
  Filled 2022-03-31: qty 1.91, 28d supply, fill #1
  Filled 2022-04-29 – 2022-05-12 (×2): qty 1.91, 28d supply, fill #2
  Filled 2022-07-13: qty 1.91, 28d supply, fill #3
  Filled 2022-08-03: qty 1.91, 28d supply, fill #4
  Filled 2022-09-01: qty 1.91, 28d supply, fill #5

## 2022-02-16 MED ORDER — BUSPIRONE HCL 5 MG PO TABS
ORAL_TABLET | ORAL | 0 refills | Status: DC
  Filled 2022-02-16: qty 90, 30d supply, fill #0

## 2022-02-16 NOTE — Addendum Note (Signed)
Addended by: Daisy Blossom, Annie Main L on: 02/16/2022 03:57 PM   Modules accepted: Orders

## 2022-02-16 NOTE — Progress Notes (Signed)
   S: Patient presents today for review of their specialty medication.    Patient is currently taking Tezspire for asthma. Patient is managed by Dr. Ernst Bowler this.   Dosing: 210 mg subq q28days   Adherence: Has not yet started.    Efficacy: Has not yet started.    Monitoring:  S/Sx of hypersensitivity: none PFTs: monitored by Dr. Gillermina Hu office S/sx of infection: none    Current adverse effects: none reported   O:     Lab Results  Component Value Date   WBC 9.0 11/03/2021   HGB 16.6 11/03/2021   HCT 48.8 11/03/2021   MCV 83 11/03/2021   PLT 284 11/03/2021      Chemistry      Component Value Date/Time   NA 139 08/04/2021 0901   NA 140 05/13/2015 1428   K 3.9 08/04/2021 0901   CL 105 08/04/2021 0901   CO2 27 08/04/2021 0901   BUN 15 08/04/2021 0901   BUN 10 05/13/2015 1428   CREATININE 1.20 10/01/2021 1714   CREATININE 1.11 04/01/2021 1756      Component Value Date/Time   CALCIUM 8.8 (L) 08/04/2021 0901   ALKPHOS 82 05/13/2015 1428   AST 21 04/01/2021 1756   ALT 25 04/01/2021 1756   BILITOT 0.5 04/01/2021 1756   BILITOT 0.4 05/13/2015 1428       A/P: 1. Medication review: patient is about to start Tezspire for asthma. We discussed the medication including the following: Tezepelumab-ekko is a human monoclonal antibody IgG2? that binds to human thymic stromal lymphopoietin (TSLP), an epithelial cytokine, and prevents human TSLP from interacting with the heterodimeric TSLP receptor. Blocking TSLP with tezepelumab-ekko reduces biomarkers and cytokines associated with inflammation including blood eosinophils, airway submucosal eosinophils, IgE, FeNO, IL-5, and IL-13; however, the mechanism of tezepelumab-ekko action in asthma has not been definitively established. Prior to administration, remove the vial or pre-filled syringe from the refrigerator and allow to reach room temperature for ~60 minutes (do not shake or expose to heat). Administer as a SUBQ injection  into the upper arm, thigh, or lower abdomen (avoiding areas within 2 inches of navel). Rotate injection sites; do not inject into areas where the skin is tender, bruised, erythematous, or hardened. Adverse effects are uncommon but may include antibody development, joint pain, back pain, or pharyngitis. Patients should monitor for hypersensitivity reactions and infections. All questions and concerns were addressed. No recommendations for any changes at this time.    Benard Halsted, PharmD, [place], [place] [phone]

## 2022-02-17 ENCOUNTER — Other Ambulatory Visit (HOSPITAL_COMMUNITY): Payer: Self-pay

## 2022-02-18 NOTE — Telephone Encounter (Signed)
I called the patient to set up an appointment for his Tezspire injections. Patient's wife will have him call the office back tomorrow to set up an appointment.

## 2022-03-10 ENCOUNTER — Other Ambulatory Visit (HOSPITAL_COMMUNITY): Payer: Self-pay

## 2022-03-11 ENCOUNTER — Ambulatory Visit (INDEPENDENT_AMBULATORY_CARE_PROVIDER_SITE_OTHER): Payer: 59

## 2022-03-11 DIAGNOSIS — J4550 Severe persistent asthma, uncomplicated: Secondary | ICD-10-CM | POA: Diagnosis not present

## 2022-03-11 DIAGNOSIS — J4540 Moderate persistent asthma, uncomplicated: Secondary | ICD-10-CM

## 2022-03-11 MED ORDER — TEZEPELUMAB-EKKO 210 MG/1.91ML ~~LOC~~ SOSY
210.0000 mg | PREFILLED_SYRINGE | SUBCUTANEOUS | Status: AC
  Administered 2022-03-11 – 2023-10-28 (×10): 210 mg via SUBCUTANEOUS

## 2022-03-11 NOTE — Progress Notes (Signed)
Immunotherapy   Patient Details  Name: Sergio [name] MRN: [id number] Date of Birth: [birth year]  03/11/2022  Sergio [name] started Tezspire biologic injection today, no reactions Following schedule: For Tezspire Frequency: Every 28 days Epi-Pen:Epi-Pen Available   Consent signed and patient instructions given.   Isabel Caprice 03/11/2022, 9:16 AM

## 2022-03-12 ENCOUNTER — Other Ambulatory Visit (HOSPITAL_COMMUNITY): Payer: Self-pay

## 2022-03-15 ENCOUNTER — Encounter: Payer: Self-pay | Admitting: Internal Medicine

## 2022-03-16 ENCOUNTER — Other Ambulatory Visit (HOSPITAL_COMMUNITY): Payer: Self-pay

## 2022-03-16 ENCOUNTER — Other Ambulatory Visit: Payer: Self-pay | Admitting: Internal Medicine

## 2022-03-16 DIAGNOSIS — Z87898 Personal history of other specified conditions: Secondary | ICD-10-CM

## 2022-03-16 MED ORDER — SCOPOLAMINE 1 MG/3DAYS TD PT72
1.0000 | MEDICATED_PATCH | TRANSDERMAL | 1 refills | Status: DC
  Filled 2022-03-16: qty 10, 30d supply, fill #0

## 2022-03-18 ENCOUNTER — Other Ambulatory Visit (HOSPITAL_COMMUNITY): Payer: Self-pay

## 2022-03-31 ENCOUNTER — Other Ambulatory Visit (HOSPITAL_COMMUNITY): Payer: Self-pay

## 2022-04-01 ENCOUNTER — Other Ambulatory Visit (HOSPITAL_COMMUNITY): Payer: Self-pay

## 2022-04-01 DIAGNOSIS — G43719 Chronic migraine without aura, intractable, without status migrainosus: Secondary | ICD-10-CM | POA: Diagnosis not present

## 2022-04-01 DIAGNOSIS — M542 Cervicalgia: Secondary | ICD-10-CM | POA: Diagnosis not present

## 2022-04-01 DIAGNOSIS — M791 Myalgia, unspecified site: Secondary | ICD-10-CM | POA: Diagnosis not present

## 2022-04-01 MED ORDER — BACLOFEN 10 MG PO TABS
ORAL_TABLET | ORAL | 1 refills | Status: DC
  Filled 2022-04-01: qty 20, 30d supply, fill #0

## 2022-04-01 MED ORDER — ZONISAMIDE 100 MG PO CAPS
100.0000 mg | ORAL_CAPSULE | Freq: Every day | ORAL | 2 refills | Status: AC
  Filled 2022-04-01: qty 30, 30d supply, fill #0
  Filled 2022-05-31: qty 30, 30d supply, fill #1
  Filled 2022-11-30: qty 30, 30d supply, fill #2

## 2022-04-02 ENCOUNTER — Other Ambulatory Visit (HOSPITAL_COMMUNITY): Payer: Self-pay

## 2022-04-08 ENCOUNTER — Ambulatory Visit (INDEPENDENT_AMBULATORY_CARE_PROVIDER_SITE_OTHER): Payer: 59

## 2022-04-08 DIAGNOSIS — J4540 Moderate persistent asthma, uncomplicated: Secondary | ICD-10-CM

## 2022-04-09 ENCOUNTER — Ambulatory Visit: Payer: 59 | Admitting: Allergy & Immunology

## 2022-04-13 ENCOUNTER — Other Ambulatory Visit (HOSPITAL_COMMUNITY): Payer: Self-pay

## 2022-04-13 ENCOUNTER — Encounter: Payer: Self-pay | Admitting: Internal Medicine

## 2022-04-13 ENCOUNTER — Ambulatory Visit (INDEPENDENT_AMBULATORY_CARE_PROVIDER_SITE_OTHER): Payer: 59 | Admitting: Internal Medicine

## 2022-04-13 VITALS — BP 122/86 | HR 86 | Resp 18 | Ht 70.0 in | Wt 225.6 lb

## 2022-04-13 DIAGNOSIS — F411 Generalized anxiety disorder: Secondary | ICD-10-CM

## 2022-04-13 DIAGNOSIS — Z23 Encounter for immunization: Secondary | ICD-10-CM | POA: Diagnosis not present

## 2022-04-13 DIAGNOSIS — F419 Anxiety disorder, unspecified: Secondary | ICD-10-CM

## 2022-04-13 DIAGNOSIS — I10 Essential (primary) hypertension: Secondary | ICD-10-CM

## 2022-04-13 DIAGNOSIS — E782 Mixed hyperlipidemia: Secondary | ICD-10-CM

## 2022-04-13 DIAGNOSIS — Z0001 Encounter for general adult medical examination with abnormal findings: Secondary | ICD-10-CM | POA: Diagnosis not present

## 2022-04-13 DIAGNOSIS — Z1159 Encounter for screening for other viral diseases: Secondary | ICD-10-CM

## 2022-04-13 DIAGNOSIS — E039 Hypothyroidism, unspecified: Secondary | ICD-10-CM | POA: Diagnosis not present

## 2022-04-13 DIAGNOSIS — E559 Vitamin D deficiency, unspecified: Secondary | ICD-10-CM

## 2022-04-13 DIAGNOSIS — J4540 Moderate persistent asthma, uncomplicated: Secondary | ICD-10-CM

## 2022-04-13 DIAGNOSIS — R5383 Other fatigue: Secondary | ICD-10-CM

## 2022-04-13 HISTORY — DX: Generalized anxiety disorder: F41.1

## 2022-04-13 MED ORDER — BUSPIRONE HCL 5 MG PO TABS
5.0000 mg | ORAL_TABLET | Freq: Two times a day (BID) | ORAL | 5 refills | Status: DC
  Filled 2022-04-13 – 2022-05-31 (×2): qty 60, 30d supply, fill #0
  Filled 2022-07-06: qty 60, 30d supply, fill #1
  Filled 2022-09-01: qty 60, 30d supply, fill #2
  Filled 2022-10-27: qty 60, 30d supply, fill #3
  Filled 2022-11-30: qty 60, 30d supply, fill #4
  Filled 2022-12-24: qty 60, 30d supply, fill #5

## 2022-04-13 NOTE — Progress Notes (Signed)
Established Patient Office Visit  Subjective:  Patient ID: Sergio [name], male    DOB: 08/12/1976  Age: 46 y.o. MRN: [id number]  CC:  Chief Complaint  Patient presents with   Annual Exam    Annual exam pt said 1 week ago left index finger was bruised when he woke up now the bruise is gone but it still hurts    HPI Sergio [name] is a 46 y.o. male with past medical history of HTN, migraine, asthma, OSA, GERD, IBS and HLD who presents for annual physical.  He complains of left index finger pain X 1 week.  He denies any recent injury.  He woke up with bruising of index finger in the last week, but the bruising has resolved now.  Denies any numbness or tingling of the fingers.  HTN: BP is well-controlled. Takes medications regularly. Patient denies headache, dizziness, chest pain, dyspnea or palpitations.  He also takes Zocor for HLD.   Asthma and allergic rhinitis: He uses Symbicort and as needed Xopenex for asthma.  Denies any dyspnea or wheezing currently.  He uses azelastine nasal spray as needed for allergies.  He also takes Zyrtec and Singulair for allergies.   He has a history of migraine, for which he takes zonisamide.  He follows up with neurology.  He uses CPAP for OSA.  Denies any major discomfort currently.     Past Medical History:  Diagnosis Date   Asthma with allergic rhinitis 03/11/2008   Essential hypertension 05/06/2012   GAD (generalized anxiety disorder) 04/13/2022   Gastroesophageal reflux disease with hiatal hernia 05/06/2012   Gastroesophageal reflux disease with hiatal hernia    Hyperlipidemia LDL goal < 160 03/11/2008   Hypothyroidism 10/13/2021   Irritable bowel syndrome with constipation and diarrhea 05/06/2012   Migraine    Obesity (BMI 30.0-34.9) 03/03/2013   Obstructive sleep apnea 11/19/2008   Nocturnal polysomnography 12/14/2008: AHI 20.8, O2 Sat nadir 84%, optimal CPAP 12 cm H2O    Urticaria     Past Surgical History:  Procedure Laterality Date    COLONOSCOPY WITH PROPOFOL N/A 08/06/2021   Procedure: COLONOSCOPY WITH PROPOFOL;  Surgeon: Harvel Quale, MD;  Location: [facility];  Service: Gastroenterology;  Laterality: N/A;  915   POLYPECTOMY  08/06/2021   Procedure: POLYPECTOMY;  Surgeon: Montez Morita, Quillian Quince, MD;  Location: [facility];  Service: Gastroenterology;;  cecum and ascending   URETHRAL DILATION      Family History  Problem Relation Age of Onset   Early death Father        Accident   Hypertension Maternal Grandmother    Vaginal cancer Maternal Grandmother    Parkinson's disease Maternal Grandmother    Diabetes Paternal Grandfather    ALS Paternal Grandfather    Breast cancer Paternal Grandmother    Kidney cancer Paternal 17    Healthy Mother    Healthy Sister    Healthy Sister    Hypertension Maternal Uncle    Diabetes Paternal Aunt    COPD Maternal Grandfather    Urticaria Neg Hx    Immunodeficiency Neg Hx    Eczema Neg Hx    Atopy Neg Hx    Angioedema Neg Hx    Allergic rhinitis Neg Hx    Asthma Neg Hx     Social History   Socioeconomic History   Marital status: Married    Spouse name: Not on file   Number of children: 0   Years of education: college  Highest education level: Not on file  Occupational History   Occupation: Medical Records  Tobacco Use   Smoking status: Never   Smokeless tobacco: Never  Vaping Use   Vaping Use: Never used  Substance and Sexual Activity   Alcohol use: Yes    Comment: occ   Drug use: No   Sexual activity: Yes    Birth control/protection: None  Other Topics Concern   Not on file  Social History Narrative   Works for [employer] in the [employer].  Married for 23 years, no children.   Right-handed.   Occasionally caffeine use.   Social Determinants of Health   Financial Resource Strain: Not on file  Food Insecurity: Not on file  Transportation Needs: Not on file  Physical Activity: Not on file  Stress: Not on file   Social Connections: Not on file  Intimate Partner Violence: Not on file    Outpatient Medications Prior to Visit  Medication Sig Dispense Refill   Azelastine HCl 137 MCG/SPRAY SOLN INSTILL 2 SPRAYS INTO BOTH NOSTRILS TWICE DAILY 90 mL 1   baclofen (LIORESAL) 10 MG tablet Take 1 tablet by mouth 2 times a day as needed Limit 1-2 treatments per week 20 tablet 1   Budeson-Glycopyrrol-Formoterol (BREZTRI AEROSPHERE) 160-9-4.8 MCG/ACT AERO Inhale 2 puffs into the lungs in the morning and at bedtime. 10.7 g 3   budesonide-formoterol (SYMBICORT) 160-4.5 MCG/ACT inhaler INHALE 2 PUFFS INTO THE LUNGS TWO TIMES DAILY 10.2 g 5   cetirizine (ZYRTEC) 10 MG tablet Take 10 mg by mouth daily.     Cholecalciferol (VITAMIN D3) 125 MCG (5000 UT) TABS Take 5,000 Units by mouth daily.     diphenhydrAMINE (BENADRYL) 25 MG tablet Take 50 mg by mouth daily as needed for itching.     EPINEPHrine 0.3 mg/0.3 mL IJ SOAJ injection Inject 0.3 mg into the muscle as needed for anaphylaxis. 2 each 1   hyoscyamine (LEVSIN) 0.125 MG tablet Take 1 tablet (0.125 mg total) by mouth every 6 (six) hours as needed for abdominal pain. 30 tablet 3   levalbuterol (XOPENEX HFA) 45 MCG/ACT inhaler Inhale 4 puffs by mouth every 4-6 hours as needed. 15 g 1   losartan-hydrochlorothiazide (HYZAAR) 100-25 MG tablet Take 1 tablet by mouth daily. 90 tablet 1   montelukast (SINGULAIR) 10 MG tablet Take 1 tablet (10 mg total) by mouth daily. 90 tablet 1   pantoprazole (PROTONIX) 40 MG tablet Take 1 tablet (40 mg total) by mouth daily. 90 tablet 1   scopolamine (TRANSDERM-SCOP) 1 MG/3DAYS Place 1 patch (1.5 mg total) onto the skin every 3 (three) days. 10 patch 1   simvastatin (ZOCOR) 40 MG tablet Take 1 tablet (40 mg total) by mouth at bedtime. 90 tablet 1   tezepelumab-ekko (TEZSPIRE) 210 MG/1.91ML syringe Inject 1.91 mLs (210 mg total) into the skin once for 1 dose. 1.91 mL 11   thyroid (NP THYROID) 90 MG tablet Take 1 tablet (90 mg total) by  mouth daily. 90 tablet 1   zonisamide (ZONEGRAN) 100 MG capsule Take 1 capsule (100 mg total) by mouth daily. 30 capsule 2   baclofen (LIORESAL) 10 MG tablet Take 1 tablet by mouth 2 times a day as needed. Limit 1-2 treatments per week (Patient not taking: Reported on 04/13/2022) 20 tablet 1   baclofen (LIORESAL) 10 MG tablet Take 1 tablet by mouth 2 times a day as needed (Limit 1-2 treatments per week) (Patient not taking: Reported on 04/13/2022) 20 tablet 1  busPIRone (BUSPAR) 5 MG tablet TAKE 1 TABLET BY MOUTH 3 TIMES DAILY (NEEDS APPT) 90 tablet 0   zonisamide (ZONEGRAN) 100 MG capsule Take 1 capsule (100 mg total) by mouth daily. (Patient not taking: Reported on 04/13/2022) 30 capsule 2   zonisamide (ZONEGRAN) 100 MG capsule Take 1 capsule (100 mg total) by mouth daily. (Patient not taking: Reported on 04/13/2022) 30 capsule 2   Facility-Administered Medications Prior to Visit  Medication Dose Route Frequency Provider Last Rate Last Admin   tezepelumab-ekko (TEZSPIRE) 210 MG/1.91ML syringe 210 mg  210 mg Subcutaneous Q28 days Clemon Chambers, MD   210 mg at 04/08/22 7371    Allergies  Allergen Reactions   Other Hives and Swelling    Cashews     ROS Review of Systems  Constitutional:  Negative for chills and fever.  HENT:  Negative for congestion and sore throat.   Eyes:  Negative for pain and discharge.  Respiratory:  Negative for cough and shortness of breath.   Cardiovascular:  Negative for chest pain and palpitations.  Gastrointestinal:  Positive for constipation. Negative for nausea and vomiting.  Endocrine: Negative for polydipsia and polyuria.  Genitourinary:  Negative for dysuria and hematuria.  Musculoskeletal:  Negative for neck pain and neck stiffness.  Skin:  Negative for rash.  Allergic/Immunologic: Positive for environmental allergies.  Neurological:  Positive for headaches. Negative for dizziness, weakness and numbness.  Psychiatric/Behavioral:  Negative for agitation  and behavioral problems.       Objective:    Physical Exam Vitals reviewed.  Constitutional:      General: He is not in acute distress.    Appearance: He is not diaphoretic.  HENT:     Head: Normocephalic and atraumatic.     Nose: Nose normal.     Mouth/Throat:     Mouth: Mucous membranes are moist.  Eyes:     General: No scleral icterus.    Extraocular Movements: Extraocular movements intact.  Cardiovascular:     Rate and Rhythm: Normal rate and regular rhythm.     Pulses: Normal pulses.     Heart sounds: Normal heart sounds. No murmur heard. Pulmonary:     Breath sounds: Normal breath sounds. No wheezing or rales.  Abdominal:     Palpations: Abdomen is soft.     Tenderness: There is no abdominal tenderness.  Musculoskeletal:     Cervical back: Neck supple. No tenderness.     Right lower leg: No edema.     Left lower leg: No edema.  Skin:    General: Skin is warm.     Findings: No rash.  Neurological:     General: No focal deficit present.     Mental Status: He is alert and oriented to person, place, and time.     Sensory: No sensory deficit.     Motor: No weakness.  Psychiatric:        Mood and Affect: Mood normal.        Behavior: Behavior normal.     BP 122/86 (BP Location: Right Arm, Patient Position: Sitting, Cuff Size: Normal)   Pulse 86   Resp 18   Ht 5' 10"  (1.778 m)   Wt 225 lb 9.6 oz (102.3 kg)   SpO2 98%   BMI 32.37 kg/m  Wt Readings from Last 3 Encounters:  04/13/22 225 lb 9.6 oz (102.3 kg)  11/10/21 228 lb 1.6 oz (103.5 kg)  11/03/21 228 lb 12.8 oz (103.8 kg)    Lab  Results  Component Value Date   TSH 1.890 04/13/2022   Lab Results  Component Value Date   WBC 6.9 04/13/2022   HGB 15.9 04/13/2022   HCT 46.9 04/13/2022   MCV 84 04/13/2022   PLT 265 04/13/2022   Lab Results  Component Value Date   NA 142 04/13/2022   K 5.0 04/13/2022   CO2 27 04/13/2022   GLUCOSE 107 (H) 04/13/2022   BUN 13 04/13/2022   CREATININE 1.08  04/13/2022   BILITOT 0.4 04/13/2022   ALKPHOS 74 04/13/2022   AST 14 04/13/2022   ALT 16 04/13/2022   PROT 6.9 04/13/2022   ALBUMIN 4.5 04/13/2022   CALCIUM 9.7 04/13/2022   ANIONGAP 7 08/04/2021   EGFR 85 04/13/2022   Lab Results  Component Value Date   CHOL 153 04/13/2022   Lab Results  Component Value Date   HDL 40 04/13/2022   Lab Results  Component Value Date   LDLCALC 94 04/13/2022   Lab Results  Component Value Date   TRIG 102 04/13/2022   Lab Results  Component Value Date   CHOLHDL 3.8 04/13/2022   Lab Results  Component Value Date   HGBA1C 5.4 04/13/2022      Assessment & Plan:   Problem List Items Addressed This Visit       Cardiovascular and Mediastinum   Essential hypertension (Chronic)    BP Readings from Last 1 Encounters:  04/13/22 122/86  Well-controlled with Hyzaar Counseled for compliance with the medications Advised DASH diet and moderate exercise/walking, at least 150 mins/week      Relevant Orders   CMP14+EGFR (Completed)   CBC with Differential/Platelet (Completed)     Respiratory   Asthma with allergic rhinitis (Chronic)    Well controlled with Symbicort Uses azelastine spray for allergic rhinitis On Tezspire now On Zyrtec and Singulair for allergies      Relevant Orders   CBC with Differential/Platelet (Completed)     Endocrine   Hypothyroidism    Lab Results  Component Value Date   TSH 1.890 04/13/2022  On NP thyroid 90 mg daily Will continue for now Check TSH and free T4      Relevant Orders   TSH + free T4 (Completed)     Other   Hyperlipidemia (Chronic)   Relevant Orders   Lipid panel (Completed)   GAD (generalized anxiety disorder)    Well-controlled with Buspar 5 mg BID      Relevant Medications   busPIRone (BUSPAR) 5 MG tablet   Encounter for general adult medical examination with abnormal findings - Primary    Physical exam as documented. Counseling done  re healthy lifestyle involving  commitment to 150 minutes exercise per week, heart healthy diet, and attaining healthy weight.The importance of adequate sleep also discussed. Changes in health habits are decided on by the patient with goals and time frames  set for achieving them. Immunization and cancer screening needs are specifically addressed at this visit.      Relevant Orders   Hemoglobin A1c (Completed)   CBC with Differential/Platelet (Completed)   Other Visit Diagnoses     Vitamin D deficiency disease       Relevant Orders   VITAMIN D 25 Hydroxy (Vit-D Deficiency, Fractures) (Completed)   Need for hepatitis C screening test       Relevant Orders   Hepatitis C Antibody (Completed)   Need for Tdap vaccination       Relevant Orders   Tdap vaccine greater  than or equal to 7yo IM (Completed)       Meds ordered this encounter  Medications   busPIRone (BUSPAR) 5 MG tablet    Sig: Take 1 tablet by mouth 2 times daily.    Dispense:  60 tablet    Refill:  5    Follow-up: Return in about 6 months (around 10/14/2022) for HTN and hypothyroidism.    Lindell Spar, MD

## 2022-04-13 NOTE — Patient Instructions (Signed)
Please continue taking medications as prescribed.  Please continue to follow low carb diet and perform moderate exercise/walking at least 150 mins/week. 

## 2022-04-14 LAB — CMP14+EGFR
ALT: 16 IU/L (ref 0–44)
AST: 14 IU/L (ref 0–40)
Albumin/Globulin Ratio: 1.9 (ref 1.2–2.2)
Albumin: 4.5 g/dL (ref 4.1–5.1)
Alkaline Phosphatase: 74 IU/L (ref 44–121)
BUN/Creatinine Ratio: 12 (ref 9–20)
BUN: 13 mg/dL (ref 6–24)
Bilirubin Total: 0.4 mg/dL (ref 0.0–1.2)
CO2: 27 mmol/L (ref 20–29)
Calcium: 9.7 mg/dL (ref 8.7–10.2)
Chloride: 100 mmol/L (ref 96–106)
Creatinine, Ser: 1.08 mg/dL (ref 0.76–1.27)
Globulin, Total: 2.4 g/dL (ref 1.5–4.5)
Glucose: 107 mg/dL — ABNORMAL HIGH (ref 70–99)
Potassium: 5 mmol/L (ref 3.5–5.2)
Sodium: 142 mmol/L (ref 134–144)
Total Protein: 6.9 g/dL (ref 6.0–8.5)
eGFR: 85 mL/min/{1.73_m2} (ref >59)

## 2022-04-14 LAB — LIPID PANEL
Chol/HDL Ratio: 3.8 ratio (ref 0.0–5.0)
Cholesterol, Total: 153 mg/dL (ref 100–199)
HDL: 40 mg/dL (ref >39)
LDL Chol Calc (NIH): 94 mg/dL (ref 0–99)
Triglycerides: 102 mg/dL (ref 0–149)
VLDL Cholesterol Cal: 19 mg/dL (ref 5–40)

## 2022-04-14 LAB — TSH+FREE T4
Free T4: 1.01 ng/dL (ref 0.82–1.77)
TSH: 1.89 u[IU]/mL (ref 0.450–4.500)

## 2022-04-14 LAB — CBC WITH DIFFERENTIAL/PLATELET
Basophils Absolute: 0.1 10*3/uL (ref 0.0–0.2)
Basos: 1 %
EOS (ABSOLUTE): 0 10*3/uL (ref 0.0–0.4)
Eos: 1 %
Hematocrit: 46.9 % (ref 37.5–51.0)
Hemoglobin: 15.9 g/dL (ref 13.0–17.7)
Immature Grans (Abs): 0 10*3/uL (ref 0.0–0.1)
Immature Granulocytes: 0 %
Lymphocytes Absolute: 2.2 10*3/uL (ref 0.7–3.1)
Lymphs: 31 %
MCH: 28.5 pg (ref 26.6–33.0)
MCHC: 33.9 g/dL (ref 31.5–35.7)
MCV: 84 fL (ref 79–97)
Monocytes Absolute: 0.6 10*3/uL (ref 0.1–0.9)
Monocytes: 8 %
Neutrophils Absolute: 4.1 10*3/uL (ref 1.4–7.0)
Neutrophils: 59 %
Platelets: 265 10*3/uL (ref 150–450)
RBC: 5.58 x10E6/uL (ref 4.14–5.80)
RDW: 12.9 % (ref 11.6–15.4)
WBC: 6.9 10*3/uL (ref 3.4–10.8)

## 2022-04-14 LAB — HEMOGLOBIN A1C
Est. average glucose Bld gHb Est-mCnc: 108 mg/dL
Hgb A1c MFr Bld: 5.4 % (ref 4.8–5.6)

## 2022-04-14 LAB — HEPATITIS C ANTIBODY: Hep C Virus Ab: NONREACTIVE

## 2022-04-14 LAB — VITAMIN D 25 HYDROXY (VIT D DEFICIENCY, FRACTURES): Vit D, 25-Hydroxy: 72.8 ng/mL (ref 30.0–100.0)

## 2022-04-17 DIAGNOSIS — Z0001 Encounter for general adult medical examination with abnormal findings: Secondary | ICD-10-CM | POA: Insufficient documentation

## 2022-04-17 NOTE — Assessment & Plan Note (Signed)
BP Readings from Last 1 Encounters:  04/13/22 122/86   Well-controlled with Hyzaar Counseled for compliance with the medications Advised DASH diet and moderate exercise/walking, at least 150 mins/week

## 2022-04-17 NOTE — Assessment & Plan Note (Signed)
Well controlled with Symbicort Uses azelastine spray for allergic rhinitis On Tezspire now On Zyrtec and Singulair for allergies

## 2022-04-17 NOTE — Assessment & Plan Note (Signed)
Well-controlled with Buspar 5 mg BID

## 2022-04-17 NOTE — Assessment & Plan Note (Signed)

## 2022-04-17 NOTE — Assessment & Plan Note (Signed)
Lab Results  Component Value Date   TSH 1.890 04/13/2022   On NP thyroid 90 mg daily Will continue for now Check TSH and free T4

## 2022-04-21 ENCOUNTER — Other Ambulatory Visit (HOSPITAL_COMMUNITY): Payer: Self-pay

## 2022-04-24 ENCOUNTER — Other Ambulatory Visit (HOSPITAL_COMMUNITY): Payer: Self-pay

## 2022-04-27 ENCOUNTER — Other Ambulatory Visit (HOSPITAL_COMMUNITY): Payer: Self-pay

## 2022-04-28 ENCOUNTER — Other Ambulatory Visit (HOSPITAL_COMMUNITY): Payer: Self-pay

## 2022-04-29 ENCOUNTER — Other Ambulatory Visit (HOSPITAL_COMMUNITY): Payer: Self-pay

## 2022-05-01 ENCOUNTER — Other Ambulatory Visit (HOSPITAL_COMMUNITY): Payer: Self-pay

## 2022-05-02 ENCOUNTER — Other Ambulatory Visit (HOSPITAL_COMMUNITY): Payer: Self-pay

## 2022-05-04 ENCOUNTER — Other Ambulatory Visit (HOSPITAL_COMMUNITY): Payer: Self-pay

## 2022-05-04 ENCOUNTER — Telehealth: Payer: Self-pay | Admitting: *Deleted

## 2022-05-04 NOTE — Telephone Encounter (Signed)
Patient Sergio [name] needs approval and will need MD appt for same. Tried to call patient but voicemail full and unable to leaves message

## 2022-05-05 ENCOUNTER — Other Ambulatory Visit (HOSPITAL_COMMUNITY): Payer: Self-pay

## 2022-05-06 ENCOUNTER — Ambulatory Visit: Payer: 59 | Admitting: Internal Medicine

## 2022-05-06 ENCOUNTER — Encounter: Payer: Self-pay | Admitting: Internal Medicine

## 2022-05-06 ENCOUNTER — Other Ambulatory Visit (HOSPITAL_COMMUNITY): Payer: Self-pay

## 2022-05-06 ENCOUNTER — Ambulatory Visit: Payer: 59

## 2022-05-06 DIAGNOSIS — U071 COVID-19: Secondary | ICD-10-CM | POA: Diagnosis not present

## 2022-05-06 MED ORDER — NIRMATRELVIR/RITONAVIR (PAXLOVID)TABLET: 3.0000 | ORAL_TABLET | Freq: Two times a day (BID) | ORAL | 0 refills | Status: AC

## 2022-05-06 NOTE — Progress Notes (Signed)
Virtual Visit via Telephone Note   This visit type was conducted due to national recommendations for restrictions regarding the COVID-19 Pandemic (e.g. social distancing) in an effort to limit this patient's exposure and mitigate transmission in our community.  Due to his co-morbid illnesses, this patient is at least at moderate risk for complications without adequate follow up.  This format is felt to be most appropriate for this patient at this time.  The patient did not have access to video technology/had technical difficulties with video requiring transitioning to audio format only (telephone).  All issues noted in this document were discussed and addressed.  No physical exam could be performed with this format.  Evaluation Performed:  Follow-up visit  Date:  05/06/2022   ID:  Sergio [name], Sergio [name] [birth year], MRN [id number]  Patient Location: Home Provider Location: Office/Clinic  Participants: Patient Location of Patient: Home Location of Provider: Telehealth Consent was obtain for visit to be over via telehealth. I verified that I am speaking with the correct person using two identifiers.  PCP:  Lindell Spar, MD   Chief Complaint: Nasal congestion, cough and fatigue  History of Present Illness:    Sergio [name] is a 46 y.o. male who has a televisit for complaint of nasal congestion, postnasal drip, cough and fatigue for the last 2 days.  He tested positive for COVID at home.  He has mild dyspnea, for which he uses Xopenex as needed.  He has history of asthma, for which he uses Symbicort.  The patient does have symptoms concerning for COVID-19 infection (fever, chills, cough, or new shortness of breath).   Past Medical, Surgical, Social History, Allergies, and Medications have been Reviewed.  Past Medical History:  Diagnosis Date   Asthma with allergic rhinitis 03/11/2008   Essential hypertension 05/06/2012   GAD (generalized anxiety disorder) 04/13/2022    Gastroesophageal reflux disease with hiatal hernia 05/06/2012   Gastroesophageal reflux disease with hiatal hernia    Hyperlipidemia LDL goal < 160 03/11/2008   Hypothyroidism 10/13/2021   Irritable bowel syndrome with constipation and diarrhea 05/06/2012   Migraine    Obesity (BMI 30.0-34.9) 03/03/2013   Obstructive sleep apnea 11/19/2008   Nocturnal polysomnography 12/14/2008: AHI 20.8, O2 Sat nadir 84%, optimal CPAP 12 cm H2O    Urticaria    Past Surgical History:  Procedure Laterality Date   COLONOSCOPY WITH PROPOFOL N/A 08/06/2021   Procedure: COLONOSCOPY WITH PROPOFOL;  Surgeon: Harvel Quale, MD;  Location: [facility];  Service: Gastroenterology;  Laterality: N/A;  915   POLYPECTOMY  08/06/2021   Procedure: POLYPECTOMY;  Surgeon: Montez Morita, Quillian Quince, MD;  Location: [facility];  Service: Gastroenterology;;  cecum and ascending   URETHRAL DILATION       Current Meds  Medication Sig   Azelastine HCl 137 MCG/SPRAY SOLN INSTILL 2 SPRAYS INTO BOTH NOSTRILS TWICE DAILY   baclofen (LIORESAL) 10 MG tablet Take 1 tablet by mouth 2 times a day as needed Limit 1-2 treatments per week   Budeson-Glycopyrrol-Formoterol (BREZTRI AEROSPHERE) 160-9-4.8 MCG/ACT AERO Inhale 2 puffs into the lungs in the morning and at bedtime.   budesonide-formoterol (SYMBICORT) 160-4.5 MCG/ACT inhaler INHALE 2 PUFFS INTO THE LUNGS TWO TIMES DAILY   busPIRone (BUSPAR) 5 MG tablet Take 1 tablet by mouth 2 times daily.   cetirizine (ZYRTEC) 10 MG tablet Take 10 mg by mouth daily.   Cholecalciferol (VITAMIN D3) 125 MCG (5000 UT) TABS Take 5,000 Units by mouth daily.  diphenhydrAMINE (BENADRYL) 25 MG tablet Take 50 mg by mouth daily as needed for itching.   EPINEPHrine 0.3 mg/0.3 mL IJ SOAJ injection Inject 0.3 mg into the muscle as needed for anaphylaxis.   hyoscyamine (LEVSIN) 0.125 MG tablet Take 1 tablet (0.125 mg total) by mouth every 6 (six) hours as needed for abdominal pain.   levalbuterol  (XOPENEX HFA) 45 MCG/ACT inhaler Inhale 4 puffs by mouth every 4-6 hours as needed.   losartan-hydrochlorothiazide (HYZAAR) 100-25 MG tablet Take 1 tablet by mouth daily.   montelukast (SINGULAIR) 10 MG tablet Take 1 tablet (10 mg total) by mouth daily.   pantoprazole (PROTONIX) 40 MG tablet Take 1 tablet (40 mg total) by mouth daily.   scopolamine (TRANSDERM-SCOP) 1 MG/3DAYS Place 1 patch (1.5 mg total) onto the skin every 3 (three) days.   simvastatin (ZOCOR) 40 MG tablet Take 1 tablet (40 mg total) by mouth at bedtime.   tezepelumab-ekko (TEZSPIRE) 210 MG/1.91ML syringe Inject 1.91 mLs (210 mg total) into the skin once for 1 dose.   thyroid (NP THYROID) 90 MG tablet Take 1 tablet (90 mg total) by mouth daily.   zonisamide (ZONEGRAN) 100 MG capsule Take 1 capsule (100 mg total) by mouth daily.   Current Facility-Administered Medications for the 05/06/22 encounter (Office Visit) with Lindell Spar, MD  Medication   tezepelumab-ekko (TEZSPIRE) 210 MG/1.91ML syringe 210 mg     Allergies:   Other   ROS:   Please see the history of present illness.     All other systems reviewed and are negative.   Labs/Other Tests and Data Reviewed:    Recent Labs: 04/13/2022: ALT 16; BUN 13; Creatinine, Ser 1.08; Hemoglobin 15.9; Platelets 265; Potassium 5.0; Sodium 142; TSH 1.890   Recent Lipid Panel Lab Results  Component Value Date/Time   CHOL 153 04/13/2022 08:45 AM   TRIG 102 04/13/2022 08:45 AM   HDL 40 04/13/2022 08:45 AM   CHOLHDL 3.8 04/13/2022 08:45 AM   CHOLHDL 5.2 (H) 07/11/2020 05:08 PM   LDLCALC 94 04/13/2022 08:45 AM   LDLCALC 147 (H) 07/11/2020 05:08 PM    Wt Readings from Last 3 Encounters:  04/13/22 225 lb 9.6 oz (102.3 kg)  11/10/21 228 lb 1.6 oz (103.5 kg)  11/03/21 228 lb 12.8 oz (103.8 kg)     ASSESSMENT & PLAN:    COVID 19 infection Started Paxlovid considering his underlying asthma Continue Alka-Seltzer as needed for cough and nasal congestion Continue  Symbicort and as needed albuterol for dyspnea or wheezing  Time:   Today, I have spent 9 minutes reviewing the chart, including problem list, medications, and with the patient with telehealth technology discussing the above problems.   Medication Adjustments/Labs and Tests Ordered: Current medicines are reviewed at length with the patient today.  Concerns regarding medicines are outlined above.   Tests Ordered: No orders of the defined types were placed in this encounter.   Medication Changes: No orders of the defined types were placed in this encounter.    Note: This dictation was prepared with Dragon dictation along with smaller phrase technology. Similar sounding words can be transcribed inadequately or may not be corrected upon review. Any transcriptional errors that result from this process are unintentional.      Disposition:  Follow up  Signed, Lindell Spar, MD  05/06/2022 11:58 AM     [place]

## 2022-05-06 NOTE — Patient Instructions (Addendum)
Please take Paxlovid as prescribed.  Continue taking Alka-Seltzer cold and sinus as needed for cough and nasal congestion.  Continue using Xopenex as needed for shortness of breath or wheezing.

## 2022-05-07 ENCOUNTER — Other Ambulatory Visit (HOSPITAL_COMMUNITY): Payer: Self-pay

## 2022-05-08 ENCOUNTER — Other Ambulatory Visit (HOSPITAL_COMMUNITY): Payer: Self-pay

## 2022-05-12 ENCOUNTER — Other Ambulatory Visit (HOSPITAL_COMMUNITY): Payer: Self-pay

## 2022-05-12 ENCOUNTER — Ambulatory Visit: Payer: 59 | Admitting: Allergy & Immunology

## 2022-05-13 ENCOUNTER — Other Ambulatory Visit (HOSPITAL_COMMUNITY): Payer: Self-pay

## 2022-05-14 ENCOUNTER — Ambulatory Visit: Payer: 59 | Admitting: Allergy

## 2022-05-14 ENCOUNTER — Other Ambulatory Visit (HOSPITAL_COMMUNITY): Payer: Self-pay

## 2022-05-14 ENCOUNTER — Encounter: Payer: Self-pay | Admitting: Allergy

## 2022-05-14 VITALS — BP 110/80 | HR 92 | Temp 98.3°F | Resp 16 | Wt 224.4 lb

## 2022-05-14 DIAGNOSIS — J4550 Severe persistent asthma, uncomplicated: Secondary | ICD-10-CM

## 2022-05-14 DIAGNOSIS — J3089 Other allergic rhinitis: Secondary | ICD-10-CM

## 2022-05-14 DIAGNOSIS — J4540 Moderate persistent asthma, uncomplicated: Secondary | ICD-10-CM

## 2022-05-14 DIAGNOSIS — J302 Other seasonal allergic rhinitis: Secondary | ICD-10-CM

## 2022-05-14 NOTE — Progress Notes (Signed)
Follow-up Note  RE: Sergio [name] MRN: [id number] DOB: [birth year] Date of Office Visit: 05/14/2022   History of present illness: Sergio [name] is a 46 y.o. male presenting today for follow-up of asthma, rhinitis and food allergy.  He was last seen in the office on 11/03/2021 by Dr. Simona Huh at which time he was treated for a sinus infection.  He had covid illness about a week ago with symptoms of feeling bad, cough and congestion.  He did contact his PCP who prescribed him Paxlovid.  He states while he was on Paxil but he did stop his routine medications to prevent any drug-drug interactions.  He is feeling better this week.  However he still has some chest tightness and coughing and has needed to use the Xopenex a bit more frequently this week.  Outside of this illness he states he would use Xopenex on average about once a week for symptom relief.  He did start on tezspire monthly injections at the end of June.  Thus he has had 2 doses thus far.  He was due for a dose last week however he states he had COVID and did not come for that scheduled injection.  He does continue to take Singulair daily as well as Breztri 2 puffs twice a day.  He does feel that his asthma has been under better control since he started on Tezspire.  He does state that his wife has noted to him that it has made a big difference as he is not doing a bunch of coughing in the daytime like he was before. He has not had any further infections since the February visit and thus has not had any further antibiotics or systemic steroid needs. With his allergies he states the allergy medications are helpful in which he does have Singulair, Astelin, Allegra.  He does plan to get back on allergy shots which it looks to be discussed previously that once his asthma is better controlled that he could resume allergy immunotherapy.  Review of systems: Review of Systems  Constitutional:  Positive for fatigue.  HENT:  Positive for  congestion.   Eyes: Negative.   Respiratory:  Positive for cough and chest tightness.   Cardiovascular: Negative.   Musculoskeletal: Negative.   Skin: Negative.   Allergic/Immunologic: Negative.   Neurological: Negative.      All other systems negative unless noted above in HPI  Past medical/social/surgical/family history have been reviewed and are unchanged unless specifically indicated below.  No changes  Medication List: Current Outpatient Medications  Medication Sig Dispense Refill   Azelastine HCl 137 MCG/SPRAY SOLN INSTILL 2 SPRAYS INTO BOTH NOSTRILS TWICE DAILY 90 mL 1   Budeson-Glycopyrrol-Formoterol (BREZTRI AEROSPHERE) 160-9-4.8 MCG/ACT AERO Inhale 2 puffs into the lungs in the morning and at bedtime. 10.7 g 3   budesonide-formoterol (SYMBICORT) 160-4.5 MCG/ACT inhaler INHALE 2 PUFFS INTO THE LUNGS TWO TIMES DAILY 10.2 g 5   busPIRone (BUSPAR) 5 MG tablet Take 1 tablet by mouth 2 times daily. 60 tablet 5   cetirizine (ZYRTEC) 10 MG tablet Take 10 mg by mouth daily.     Cholecalciferol (VITAMIN D3) 125 MCG (5000 UT) TABS Take 5,000 Units by mouth daily.     diphenhydrAMINE (BENADRYL) 25 MG tablet Take 50 mg by mouth daily as needed for itching.     EPINEPHrine 0.3 mg/0.3 mL IJ SOAJ injection Inject 0.3 mg into the muscle as needed for anaphylaxis. 2 each 1   hyoscyamine (LEVSIN) 0.125 MG  tablet Take 1 tablet (0.125 mg total) by mouth every 6 (six) hours as needed for abdominal pain. 30 tablet 3   levalbuterol (XOPENEX HFA) 45 MCG/ACT inhaler Inhale 4 puffs by mouth every 4-6 hours as needed. 15 g 1   losartan-hydrochlorothiazide (HYZAAR) 100-25 MG tablet Take 1 tablet by mouth daily. 90 tablet 1   montelukast (SINGULAIR) 10 MG tablet Take 1 tablet (10 mg total) by mouth daily. 90 tablet 1   pantoprazole (PROTONIX) 40 MG tablet Take 1 tablet (40 mg total) by mouth daily. 90 tablet 1   scopolamine (TRANSDERM-SCOP) 1 MG/3DAYS Place 1 patch (1.5 mg total) onto the skin every 3  (three) days. 10 patch 1   simvastatin (ZOCOR) 40 MG tablet Take 1 tablet (40 mg total) by mouth at bedtime. 90 tablet 1   tezepelumab-ekko (TEZSPIRE) 210 MG/1.91ML syringe Inject 1.91 mLs (210 mg total) into the skin once for 1 dose. 1.91 mL 11   thyroid (NP THYROID) 90 MG tablet Take 1 tablet (90 mg total) by mouth daily. 90 tablet 1   zonisamide (ZONEGRAN) 100 MG capsule Take 1 capsule (100 mg total) by mouth daily. 30 capsule 2   Current Facility-Administered Medications  Medication Dose Route Frequency Provider Last Rate Last Admin   tezepelumab-ekko (TEZSPIRE) 210 MG/1.91ML syringe 210 mg  210 mg Subcutaneous Q28 days Clemon Chambers, MD   210 mg at 05/14/22 1642     Known medication allergies: Allergies  Allergen Reactions   Other Hives and Swelling    Cashews      Physical examination: Blood pressure 110/80, pulse 92, temperature 98.3 F (36.8 C), temperature source Temporal, resp. rate 16, weight 224 lb 6.4 oz (101.8 kg), SpO2 97 %.  General: Alert, interactive, in no acute distress. HEENT: PERRLA, TMs pearly gray, turbinates minimally edematous without discharge, post-pharynx non erythematous. Neck: Supple without lymphadenopathy. Lungs: Clear to auscultation without wheezing, rhonchi or rales. {no increased work of breathing. coughing during exam. CV: Normal S1, S2 without murmurs. Abdomen: Nondistended, nontender. Skin: Warm and dry, without lesions or rashes. Extremities:  No clubbing, cyanosis or edema. Neuro:   Grossly intact.  Diagnositics/Labs: Tezspire injection given in office  Assessment and plan:   Moderate persistent asthma - increase symptoms with recent covid illness - Daily controller medication(s): Singulair '10mg'$  daily and Breztri two puffs twice daily with spacer - Monthly controller medication: Tezspire injection monthly.  Given dose today and will do reapproval - Prior to physical activity: Xopenex 2 puffs 10-15 minutes before physical  activity. - Rescue medications: Xopenex 4 puffs every 4-6 hours as needed - Asthma control goals:  * Full participation in all desired activities (may need albuterol before activity) * Albuterol use two time or less a week on average (not counting use with activity) * Cough interfering with sleep two time or less a month * Oral steroids no more than once a year * No hospitalizations  Chronic rhinitis (grasses, ragweed, weeds, trees, indoor molds, outdoor molds and cat) - Continue with: Singulair (montelukast) '10mg'$  daily and Astelin (azelastine) two sprays per nostril daily and Allegra (fexofenadine) '180mg'$  table once daily and Astelin (azelastine) 2 sprays per nostril 1-2 times daily as needed - hold on allergy injections until your asthma is better controlled.    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   Follow-up in 3-4 months sooner if needed.   I appreciate the opportunity to take part in Othon's care. Please do not  hesitate to contact me with questions.  Sincerely,   Prudy Feeler, MD Allergy/Immunology [facility]

## 2022-05-14 NOTE — Patient Instructions (Signed)
Moderate persistent asthma - increase symptoms with recent covid illness - Daily controller medication(s): Singulair '10mg'$  daily and Breztri two puffs twice daily with spacer - Monthly controller medication: Tezspire injection monthly.  Given dose today and will do reapproval - Prior to physical activity: Xopenex 2 puffs 10-15 minutes before physical activity. - Rescue medications: Xopenex 4 puffs every 4-6 hours as needed - Asthma control goals:  * Full participation in all desired activities (may need albuterol before activity) * Albuterol use two time or less a week on average (not counting use with activity) * Cough interfering with sleep two time or less a month * Oral steroids no more than once a year * No hospitalizations  Chronic rhinitis (grasses, ragweed, weeds, trees, indoor molds, outdoor molds and cat) - Continue with: Singulair (montelukast) '10mg'$  daily and Astelin (azelastine) two sprays per nostril daily and Allegra (fexofenadine) '180mg'$  table once daily and Astelin (azelastine) 2 sprays per nostril 1-2 times daily as needed - hold on allergy injections until your asthma is better controlled.    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   Follow-up in 3-4 months sooner if needed.

## 2022-05-15 ENCOUNTER — Other Ambulatory Visit (HOSPITAL_COMMUNITY): Payer: Self-pay

## 2022-05-19 ENCOUNTER — Other Ambulatory Visit (HOSPITAL_COMMUNITY): Payer: Self-pay

## 2022-05-25 ENCOUNTER — Other Ambulatory Visit (HOSPITAL_COMMUNITY): Payer: Self-pay

## 2022-05-31 ENCOUNTER — Other Ambulatory Visit: Payer: Self-pay | Admitting: Internal Medicine

## 2022-05-31 DIAGNOSIS — E039 Hypothyroidism, unspecified: Secondary | ICD-10-CM

## 2022-05-31 DIAGNOSIS — K219 Gastro-esophageal reflux disease without esophagitis: Secondary | ICD-10-CM

## 2022-05-31 DIAGNOSIS — I10 Essential (primary) hypertension: Secondary | ICD-10-CM

## 2022-05-31 DIAGNOSIS — E782 Mixed hyperlipidemia: Secondary | ICD-10-CM

## 2022-06-01 ENCOUNTER — Other Ambulatory Visit (HOSPITAL_COMMUNITY): Payer: Self-pay

## 2022-06-01 MED ORDER — LOSARTAN POTASSIUM-HCTZ 100-25 MG PO TABS
1.0000 | ORAL_TABLET | Freq: Every day | ORAL | 1 refills | Status: DC
  Filled 2022-06-01: qty 90, 90d supply, fill #0
  Filled 2022-10-27: qty 90, 90d supply, fill #1

## 2022-06-01 MED ORDER — THYROID 90 MG PO TABS
90.0000 mg | ORAL_TABLET | Freq: Every day | ORAL | 1 refills | Status: DC
  Filled 2022-06-01: qty 90, 90d supply, fill #0
  Filled 2022-10-27: qty 90, 90d supply, fill #1

## 2022-06-01 MED ORDER — PANTOPRAZOLE SODIUM 40 MG PO TBEC
40.0000 mg | DELAYED_RELEASE_TABLET | Freq: Every day | ORAL | 1 refills | Status: DC
  Filled 2022-06-01: qty 90, 90d supply, fill #0
  Filled 2022-09-01: qty 90, 90d supply, fill #1

## 2022-06-01 MED ORDER — SIMVASTATIN 40 MG PO TABS
40.0000 mg | ORAL_TABLET | Freq: Every day | ORAL | 1 refills | Status: DC
  Filled 2022-06-01: qty 90, 90d supply, fill #0
  Filled 2022-10-27: qty 90, 90d supply, fill #1

## 2022-06-02 ENCOUNTER — Other Ambulatory Visit (HOSPITAL_COMMUNITY): Payer: Self-pay

## 2022-06-08 ENCOUNTER — Other Ambulatory Visit (HOSPITAL_COMMUNITY): Payer: Self-pay

## 2022-06-11 ENCOUNTER — Ambulatory Visit: Payer: 59

## 2022-06-15 ENCOUNTER — Ambulatory Visit: Payer: 59

## 2022-06-22 ENCOUNTER — Ambulatory Visit (INDEPENDENT_AMBULATORY_CARE_PROVIDER_SITE_OTHER): Payer: 59

## 2022-06-22 DIAGNOSIS — J4550 Severe persistent asthma, uncomplicated: Secondary | ICD-10-CM | POA: Diagnosis not present

## 2022-07-01 ENCOUNTER — Other Ambulatory Visit (HOSPITAL_COMMUNITY): Payer: Self-pay

## 2022-07-06 ENCOUNTER — Other Ambulatory Visit (HOSPITAL_COMMUNITY): Payer: Self-pay

## 2022-07-06 ENCOUNTER — Other Ambulatory Visit: Payer: Self-pay | Admitting: Allergy & Immunology

## 2022-07-07 ENCOUNTER — Other Ambulatory Visit (HOSPITAL_COMMUNITY): Payer: Self-pay

## 2022-07-07 MED ORDER — MONTELUKAST SODIUM 10 MG PO TABS
10.0000 mg | ORAL_TABLET | Freq: Every day | ORAL | 1 refills | Status: DC
  Filled 2022-07-07: qty 90, 90d supply, fill #0
  Filled 2022-10-27: qty 90, 90d supply, fill #1

## 2022-07-08 ENCOUNTER — Other Ambulatory Visit (HOSPITAL_COMMUNITY): Payer: Self-pay

## 2022-07-13 ENCOUNTER — Other Ambulatory Visit (HOSPITAL_COMMUNITY): Payer: Self-pay

## 2022-07-14 ENCOUNTER — Other Ambulatory Visit (HOSPITAL_COMMUNITY): Payer: Self-pay

## 2022-07-16 ENCOUNTER — Other Ambulatory Visit (HOSPITAL_COMMUNITY): Payer: Self-pay

## 2022-07-20 ENCOUNTER — Ambulatory Visit: Payer: 59

## 2022-07-21 ENCOUNTER — Other Ambulatory Visit (HOSPITAL_COMMUNITY): Payer: Self-pay

## 2022-07-22 ENCOUNTER — Ambulatory Visit (INDEPENDENT_AMBULATORY_CARE_PROVIDER_SITE_OTHER): Payer: 59 | Admitting: *Deleted

## 2022-07-22 DIAGNOSIS — J4550 Severe persistent asthma, uncomplicated: Secondary | ICD-10-CM

## 2022-08-03 ENCOUNTER — Other Ambulatory Visit (HOSPITAL_COMMUNITY): Payer: Self-pay

## 2022-08-10 ENCOUNTER — Other Ambulatory Visit (HOSPITAL_COMMUNITY): Payer: Self-pay

## 2022-08-14 ENCOUNTER — Other Ambulatory Visit (HOSPITAL_COMMUNITY): Payer: Self-pay

## 2022-08-17 ENCOUNTER — Other Ambulatory Visit (HOSPITAL_COMMUNITY): Payer: Self-pay

## 2022-08-19 ENCOUNTER — Ambulatory Visit: Payer: 59

## 2022-08-20 ENCOUNTER — Encounter: Payer: Self-pay | Admitting: Internal Medicine

## 2022-08-20 NOTE — Telephone Encounter (Signed)
scheduled

## 2022-09-01 ENCOUNTER — Other Ambulatory Visit: Payer: Self-pay | Admitting: Allergy & Immunology

## 2022-09-01 ENCOUNTER — Encounter: Payer: Self-pay | Admitting: Internal Medicine

## 2022-09-01 ENCOUNTER — Other Ambulatory Visit (HOSPITAL_COMMUNITY): Payer: Self-pay

## 2022-09-01 ENCOUNTER — Ambulatory Visit: Payer: 59 | Admitting: Internal Medicine

## 2022-09-01 ENCOUNTER — Other Ambulatory Visit: Payer: Self-pay

## 2022-09-01 VITALS — BP 124/82 | HR 77 | Ht 70.0 in | Wt 225.0 lb

## 2022-09-01 DIAGNOSIS — B354 Tinea corporis: Secondary | ICD-10-CM | POA: Diagnosis not present

## 2022-09-01 DIAGNOSIS — D229 Melanocytic nevi, unspecified: Secondary | ICD-10-CM | POA: Diagnosis not present

## 2022-09-01 MED ORDER — TERBINAFINE HCL 1 % EX CREA
1.0000 | TOPICAL_CREAM | Freq: Two times a day (BID) | CUTANEOUS | 0 refills | Status: DC
  Filled 2022-09-01 – 2022-11-30 (×3): qty 30, 15d supply, fill #0

## 2022-09-01 MED ORDER — AZELASTINE HCL 137 MCG/SPRAY NA SOLN
NASAL | 1 refills | Status: DC
  Filled 2022-09-01: qty 90, 90d supply, fill #0
  Filled 2022-11-30: qty 90, 90d supply, fill #1

## 2022-09-01 NOTE — Progress Notes (Signed)
     CC: Rash (Patient complains of lesion on face and spots on abdomen for a couple months that itch and hurt sometimes.)    HPI:Sergio [name] is a 46 y.o. male who presents for evaluation of rash on upper abdomen and lesion on face. Patient has a pruritic rash on his abdomen for a few months. He works out at [employer]. In addition he has a new pigmented lesion on his face. He noticed it a few months ago and it has continued to get larger.    Past Medical History:  Diagnosis Date   Asthma with allergic rhinitis 03/11/2008   Essential hypertension 05/06/2012   GAD (generalized anxiety disorder) 04/13/2022   Gastroesophageal reflux disease with hiatal hernia 05/06/2012   Gastroesophageal reflux disease with hiatal hernia    Hyperlipidemia LDL goal < 160 03/11/2008   Hypothyroidism 10/13/2021   Irritable bowel syndrome with constipation and diarrhea 05/06/2012   Migraine    Obesity (BMI 30.0-34.9) 03/03/2013   Obstructive sleep apnea 11/19/2008   Nocturnal polysomnography 12/14/2008: AHI 20.8, O2 Sat nadir 84%, optimal CPAP 12 cm H2O    Urticaria      Physical Exam: Vitals:   09/01/22 0950  BP: 124/82  Pulse: 77  SpO2: 98%  Weight: 225 lb (102.1 kg)  Height: '5\' 10"'$  (1.778 m)   Physical Exam:  Skin: Lesion on Cheek, Pigmented papule on right cheek, no ulceration, symmetrical , even borders Rash on abdomen, pruritic round erythematous scaling patches   Assessment & Plan:   Atypical nevi New problem. Nevi has normal symmetry, borders, color and diameter. It is evolving over last 2 months. Referral placed to dermatology.  Tinea corporis Acute problem. Prescribed Terbinafine. Patient will apply twice daily until rash resolves, I expect this to take about a week. Follow up if not improving.     Lorene Dy, MD

## 2022-09-01 NOTE — Assessment & Plan Note (Signed)
New problem. Nevi has normal symmetry, borders, color and diameter. It is evolving over last 2 months. Referral placed to dermatology.

## 2022-09-01 NOTE — Patient Instructions (Addendum)
Thank you for trusting me with your care. To recap, today we discussed the following  Apply Terbinafine twice daily until rash improves, typically in one week. Follow up if not improving. Referral placed for dermatology to evaluate mole on your face.

## 2022-09-01 NOTE — Assessment & Plan Note (Signed)
Acute problem. Prescribed Terbinafine. Patient will apply twice daily until rash resolves, I expect this to take about a week. Follow up if not improving.

## 2022-09-03 ENCOUNTER — Other Ambulatory Visit (HOSPITAL_COMMUNITY): Payer: Self-pay

## 2022-09-03 ENCOUNTER — Other Ambulatory Visit: Payer: Self-pay

## 2022-09-08 ENCOUNTER — Other Ambulatory Visit: Payer: Self-pay

## 2022-09-16 ENCOUNTER — Other Ambulatory Visit: Payer: Self-pay

## 2022-09-21 ENCOUNTER — Other Ambulatory Visit (HOSPITAL_COMMUNITY): Payer: Self-pay

## 2022-09-25 ENCOUNTER — Other Ambulatory Visit (HOSPITAL_COMMUNITY): Payer: Self-pay

## 2022-09-27 IMAGING — CR DG CHEST 2V
2 series · 2 of 2 positions shown · non-contrast
Comparison: Radiograph 0333

CLINICAL DATA: Shortness of breath.  Persistent cough for 3 weeks.

EXAM:
CHEST - 2 VIEW

[w chest pa]
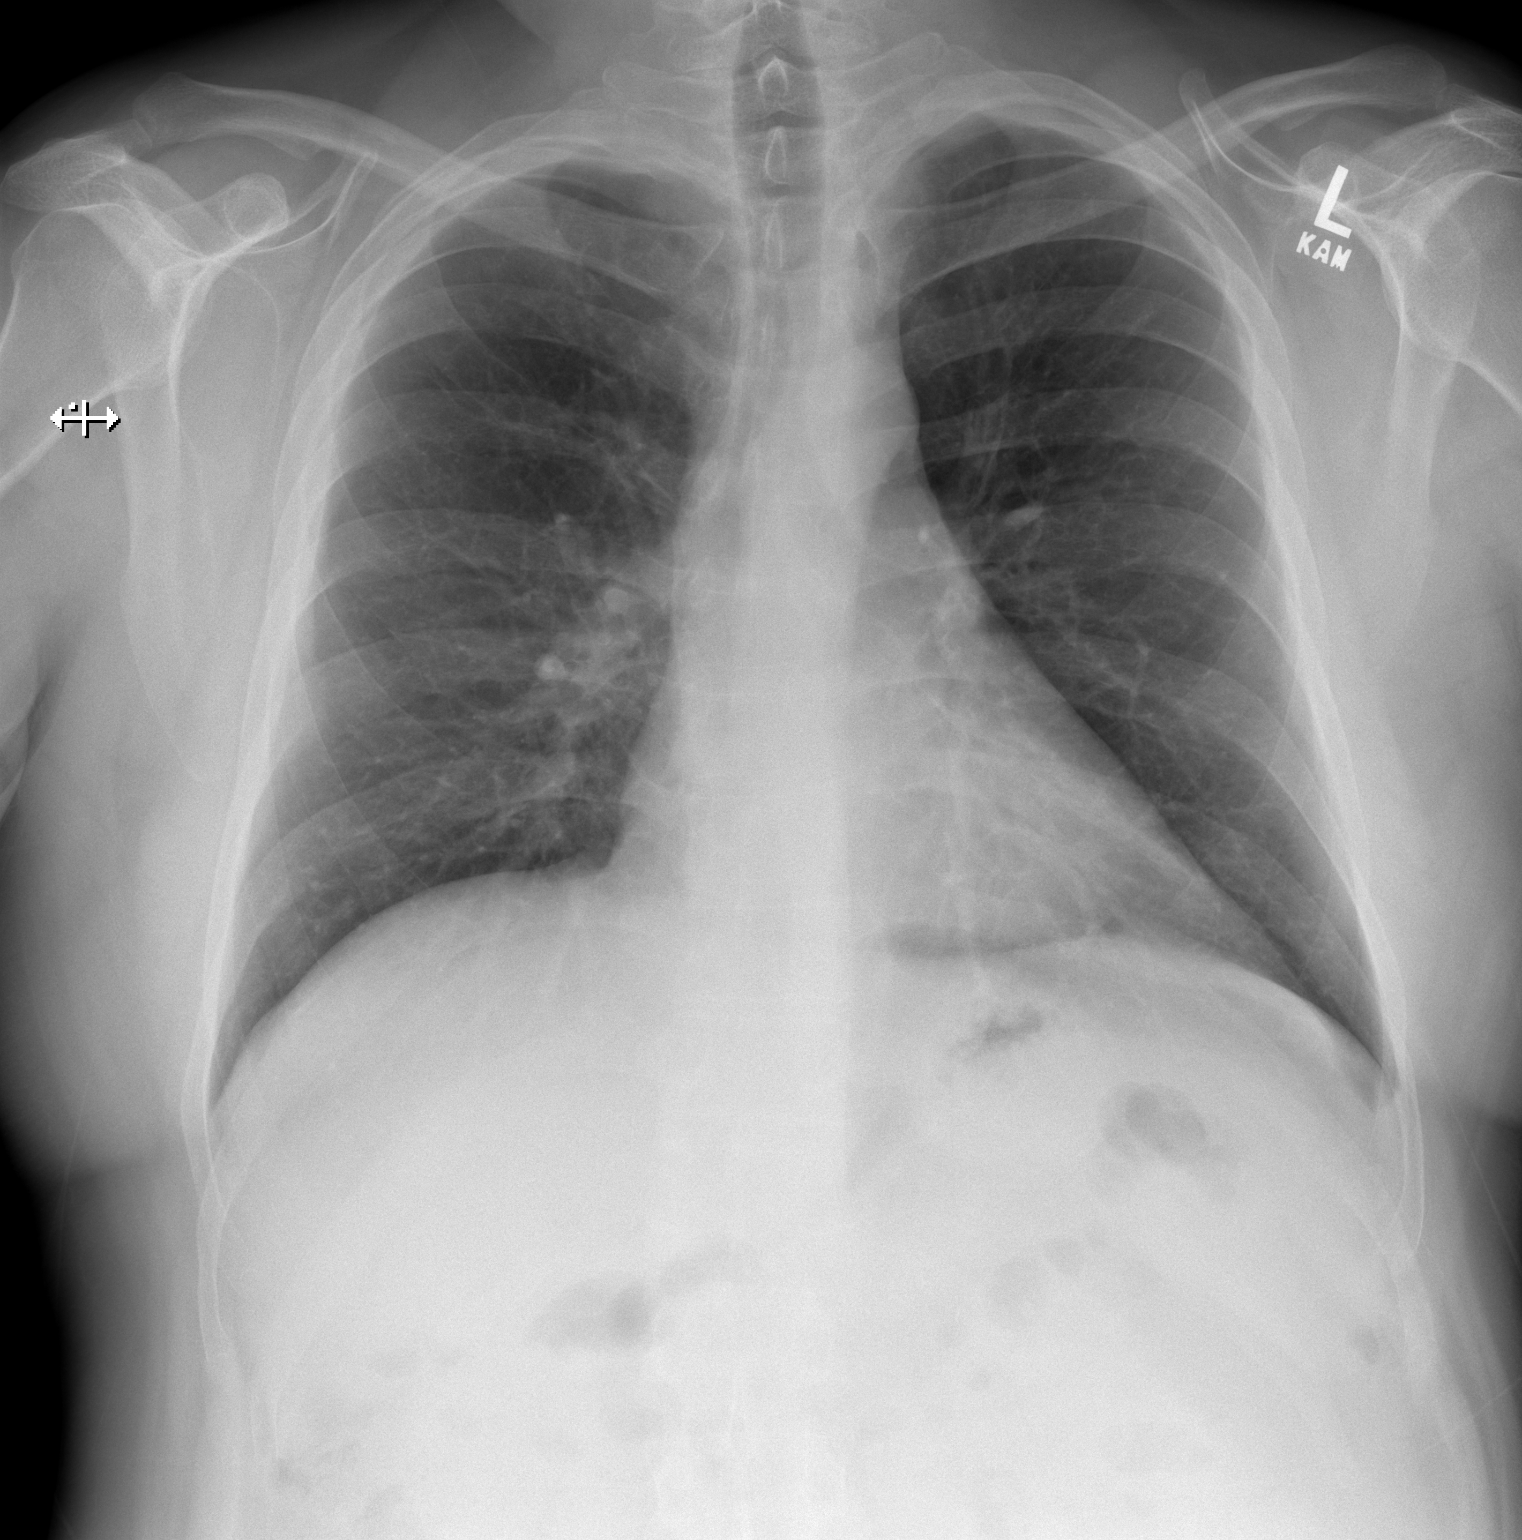

[w chest lat]
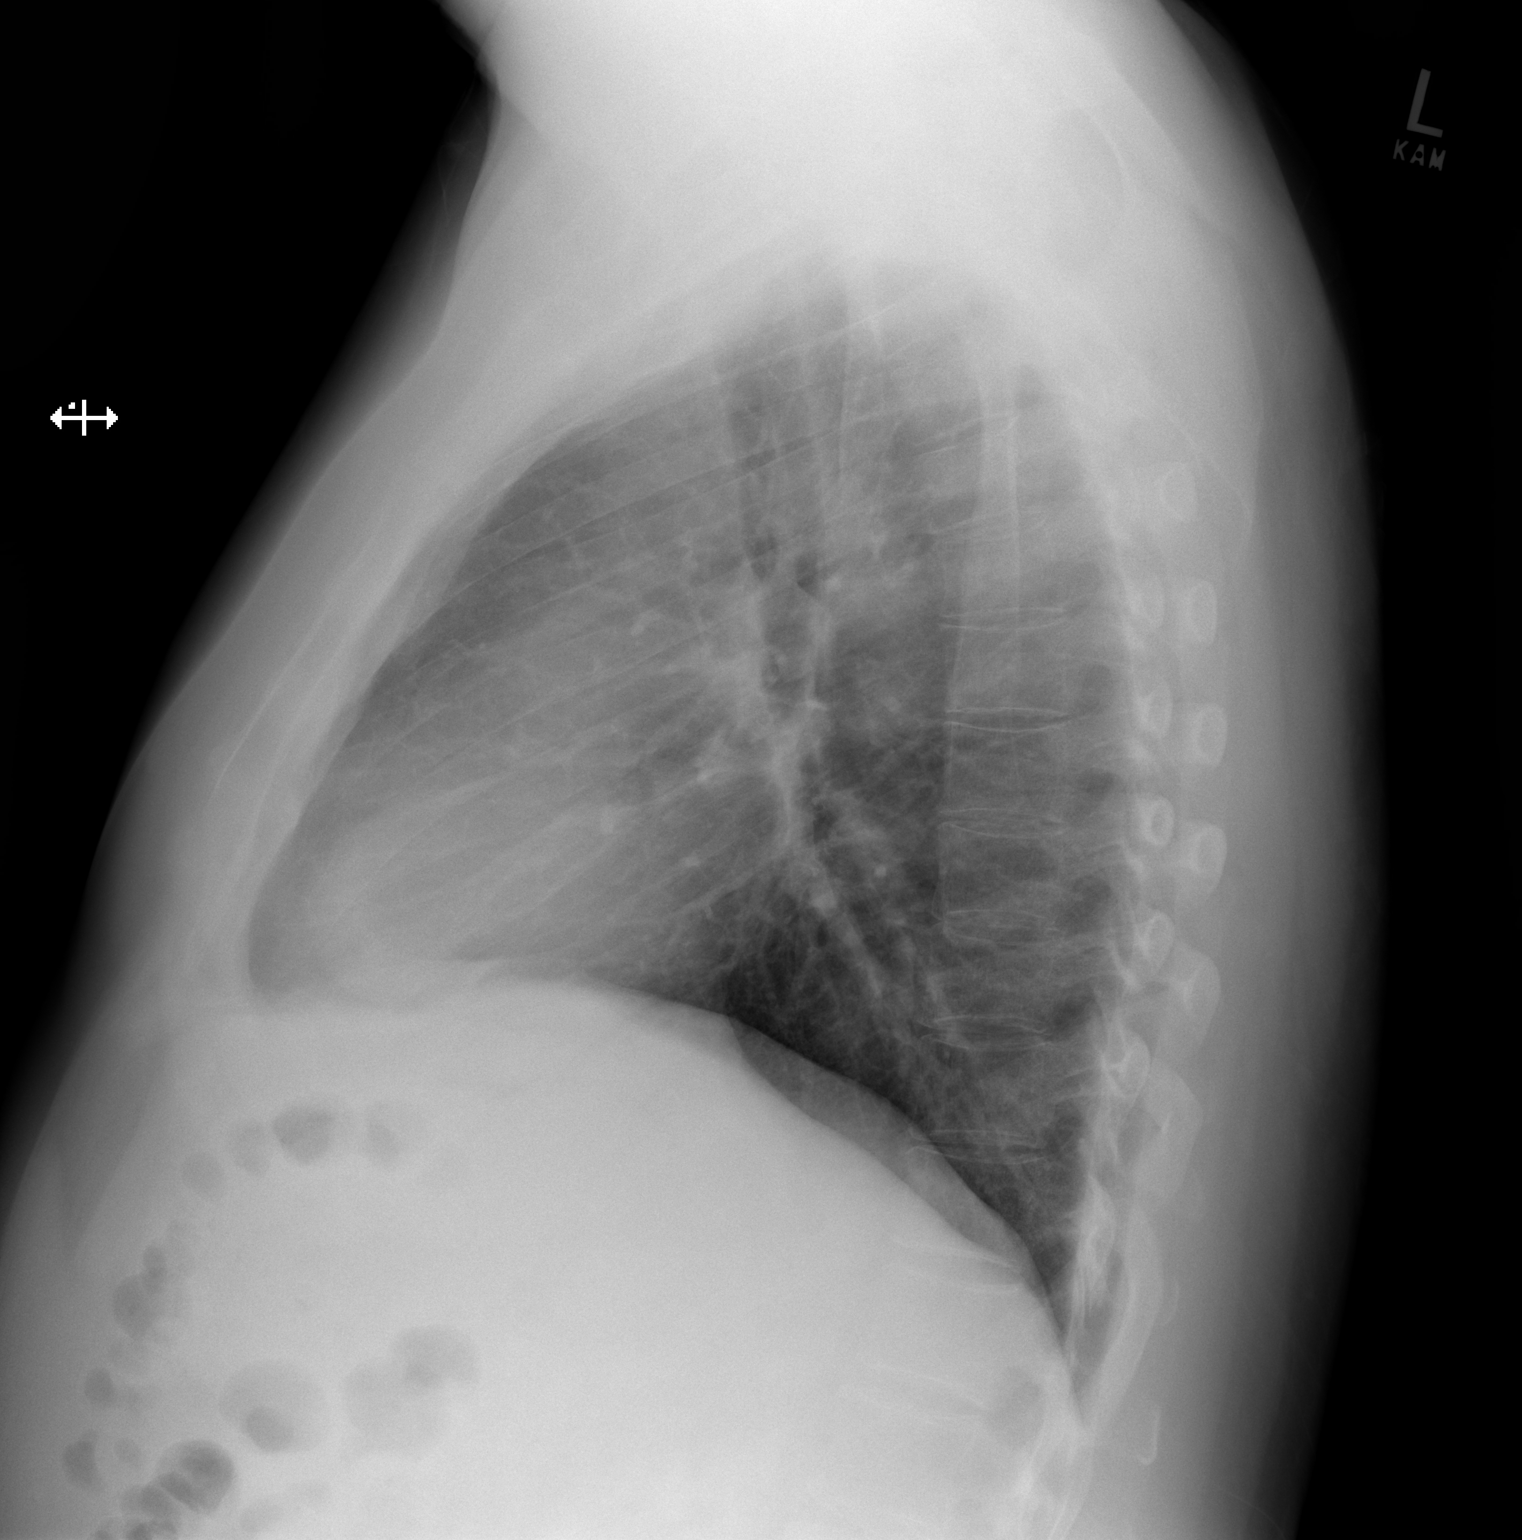

[2 of 2 positions shown; findings below may reference images not displayed]

FINDINGS: The cardiomediastinal contours are normal. The lungs are clear.
Pulmonary vasculature is normal. No consolidation, pleural effusion,
or pneumothorax. No acute osseous abnormalities are seen.
IMPRESSION: Negative radiographs of the chest.  No explanation for cough.

## 2022-10-14 ENCOUNTER — Encounter: Payer: Self-pay | Admitting: Internal Medicine

## 2022-10-14 ENCOUNTER — Ambulatory Visit: Payer: 59 | Admitting: Internal Medicine

## 2022-10-27 ENCOUNTER — Other Ambulatory Visit: Payer: Self-pay | Admitting: Allergy & Immunology

## 2022-10-27 ENCOUNTER — Other Ambulatory Visit (HOSPITAL_COMMUNITY): Payer: Self-pay

## 2022-10-27 ENCOUNTER — Other Ambulatory Visit: Payer: Self-pay

## 2022-10-27 MED ORDER — LEVALBUTEROL TARTRATE 45 MCG/ACT IN AERO
INHALATION_SPRAY | RESPIRATORY_TRACT | 0 refills | Status: DC
  Filled 2022-10-27: qty 15, 30d supply, fill #0

## 2022-11-12 ENCOUNTER — Other Ambulatory Visit (HOSPITAL_COMMUNITY): Payer: Self-pay

## 2022-11-30 ENCOUNTER — Other Ambulatory Visit: Payer: Self-pay | Admitting: Internal Medicine

## 2022-11-30 ENCOUNTER — Other Ambulatory Visit: Payer: Self-pay | Admitting: Allergy

## 2022-11-30 ENCOUNTER — Other Ambulatory Visit (HOSPITAL_COMMUNITY): Payer: Self-pay

## 2022-11-30 DIAGNOSIS — K219 Gastro-esophageal reflux disease without esophagitis: Secondary | ICD-10-CM

## 2022-11-30 MED ORDER — PANTOPRAZOLE SODIUM 40 MG PO TBEC
40.0000 mg | DELAYED_RELEASE_TABLET | Freq: Every day | ORAL | 1 refills | Status: DC
  Filled 2022-11-30: qty 90, 90d supply, fill #0
  Filled 2023-04-05: qty 90, 90d supply, fill #1

## 2022-12-01 ENCOUNTER — Other Ambulatory Visit: Payer: Self-pay

## 2022-12-01 ENCOUNTER — Other Ambulatory Visit (HOSPITAL_COMMUNITY): Payer: Self-pay

## 2022-12-10 ENCOUNTER — Other Ambulatory Visit (HOSPITAL_COMMUNITY): Payer: Self-pay

## 2022-12-24 ENCOUNTER — Other Ambulatory Visit: Payer: Self-pay | Admitting: Allergy

## 2022-12-24 ENCOUNTER — Other Ambulatory Visit (HOSPITAL_COMMUNITY): Payer: Self-pay

## 2022-12-24 ENCOUNTER — Other Ambulatory Visit: Payer: Self-pay

## 2022-12-24 MED ORDER — LEVALBUTEROL TARTRATE 45 MCG/ACT IN AERO
4.0000 | INHALATION_SPRAY | RESPIRATORY_TRACT | 1 refills | Status: AC | PRN
  Filled 2022-12-24: qty 15, 12d supply, fill #0

## 2023-04-05 ENCOUNTER — Other Ambulatory Visit: Payer: Self-pay | Admitting: Internal Medicine

## 2023-04-05 ENCOUNTER — Other Ambulatory Visit: Payer: Self-pay

## 2023-04-05 ENCOUNTER — Other Ambulatory Visit: Payer: Self-pay | Admitting: Allergy & Immunology

## 2023-04-05 ENCOUNTER — Other Ambulatory Visit (HOSPITAL_COMMUNITY): Payer: Self-pay

## 2023-04-05 DIAGNOSIS — I10 Essential (primary) hypertension: Secondary | ICD-10-CM

## 2023-04-05 DIAGNOSIS — F411 Generalized anxiety disorder: Secondary | ICD-10-CM

## 2023-04-05 DIAGNOSIS — E782 Mixed hyperlipidemia: Secondary | ICD-10-CM

## 2023-04-05 DIAGNOSIS — E039 Hypothyroidism, unspecified: Secondary | ICD-10-CM

## 2023-04-05 MED ORDER — AZELASTINE HCL 137 MCG/SPRAY NA SOLN
NASAL | 0 refills | Status: DC
  Filled 2023-04-05: qty 30, 30d supply, fill #0

## 2023-04-05 MED ORDER — SIMVASTATIN 40 MG PO TABS
40.0000 mg | ORAL_TABLET | Freq: Every day | ORAL | 1 refills | Status: DC
Start: 2023-04-05 — End: 2023-12-13
  Filled 2023-04-05: qty 90, 90d supply, fill #0
  Filled 2023-08-17 – 2023-08-19 (×2): qty 90, 90d supply, fill #1

## 2023-04-05 MED ORDER — BUSPIRONE HCL 5 MG PO TABS
5.0000 mg | ORAL_TABLET | Freq: Two times a day (BID) | ORAL | 5 refills | Status: DC
Start: 2023-04-05 — End: 2024-03-14
  Filled 2023-04-05: qty 60, 30d supply, fill #0
  Filled 2023-06-24: qty 60, 30d supply, fill #1
  Filled 2023-08-17 – 2023-08-19 (×2): qty 60, 30d supply, fill #2
  Filled 2023-10-27: qty 60, 30d supply, fill #3
  Filled 2023-12-13: qty 60, 30d supply, fill #4
  Filled 2024-01-18: qty 60, 30d supply, fill #5

## 2023-04-05 MED ORDER — LOSARTAN POTASSIUM-HCTZ 100-25 MG PO TABS
1.0000 | ORAL_TABLET | Freq: Every day | ORAL | 1 refills | Status: DC
Start: 2023-04-05 — End: 2023-12-13
  Filled 2023-04-05: qty 90, 90d supply, fill #0
  Filled 2023-08-17 – 2023-08-19 (×2): qty 90, 90d supply, fill #1

## 2023-04-05 MED ORDER — THYROID 90 MG PO TABS
90.0000 mg | ORAL_TABLET | Freq: Every day | ORAL | 1 refills | Status: DC
Start: 2023-04-05 — End: 2023-12-13
  Filled 2023-04-05: qty 90, 90d supply, fill #0
  Filled 2023-08-17 – 2023-08-19 (×2): qty 90, 90d supply, fill #1

## 2023-04-07 ENCOUNTER — Other Ambulatory Visit (HOSPITAL_COMMUNITY): Payer: Self-pay

## 2023-04-08 ENCOUNTER — Other Ambulatory Visit (HOSPITAL_COMMUNITY): Payer: Self-pay

## 2023-04-11 ENCOUNTER — Other Ambulatory Visit (HOSPITAL_COMMUNITY): Payer: Self-pay

## 2023-04-11 ENCOUNTER — Other Ambulatory Visit: Payer: Self-pay | Admitting: Allergy & Immunology

## 2023-04-14 ENCOUNTER — Other Ambulatory Visit (HOSPITAL_COMMUNITY): Payer: Self-pay

## 2023-04-18 ENCOUNTER — Other Ambulatory Visit: Payer: Self-pay | Admitting: Allergy & Immunology

## 2023-04-21 ENCOUNTER — Other Ambulatory Visit (HOSPITAL_COMMUNITY): Payer: Self-pay

## 2023-04-27 ENCOUNTER — Other Ambulatory Visit (HOSPITAL_COMMUNITY): Payer: Self-pay

## 2023-04-27 ENCOUNTER — Encounter: Payer: Self-pay | Admitting: Allergy & Immunology

## 2023-04-27 ENCOUNTER — Ambulatory Visit: Payer: Commercial Managed Care - PPO | Admitting: Allergy & Immunology

## 2023-04-27 ENCOUNTER — Ambulatory Visit (INDEPENDENT_AMBULATORY_CARE_PROVIDER_SITE_OTHER): Payer: Commercial Managed Care - PPO

## 2023-04-27 ENCOUNTER — Other Ambulatory Visit: Payer: Self-pay

## 2023-04-27 VITALS — BP 120/80 | HR 99 | Temp 98.4°F | Ht 70.0 in | Wt 228.3 lb

## 2023-04-27 DIAGNOSIS — J3089 Other allergic rhinitis: Secondary | ICD-10-CM | POA: Diagnosis not present

## 2023-04-27 DIAGNOSIS — J302 Other seasonal allergic rhinitis: Secondary | ICD-10-CM

## 2023-04-27 DIAGNOSIS — J4550 Severe persistent asthma, uncomplicated: Secondary | ICD-10-CM

## 2023-04-27 MED ORDER — BREZTRI AEROSPHERE 160-9-4.8 MCG/ACT IN AERO
2.0000 | INHALATION_SPRAY | Freq: Two times a day (BID) | RESPIRATORY_TRACT | 5 refills | Status: AC
  Filled 2023-04-27: qty 10.7, 30d supply, fill #0

## 2023-04-27 MED ORDER — CETIRIZINE HCL 10 MG PO TABS
10.0000 mg | ORAL_TABLET | Freq: Every day | ORAL | 1 refills | Status: DC
  Filled 2023-04-27: qty 90, 90d supply, fill #0
  Filled 2023-08-17 – 2023-08-19 (×2): qty 90, 90d supply, fill #1

## 2023-04-27 MED ORDER — TEZEPELUMAB-EKKO 210 MG/1.91ML ~~LOC~~ SOSY
210.0000 mg | PREFILLED_SYRINGE | Freq: Once | SUBCUTANEOUS | Status: AC
Start: 2023-04-27 — End: 2023-04-27
  Administered 2023-04-27: 210 mg via SUBCUTANEOUS

## 2023-04-27 MED ORDER — MONTELUKAST SODIUM 10 MG PO TABS
10.0000 mg | ORAL_TABLET | Freq: Every day | ORAL | 1 refills | Status: DC
  Filled 2023-04-27: qty 90, 90d supply, fill #0
  Filled 2023-08-17 – 2023-08-19 (×2): qty 90, 90d supply, fill #1

## 2023-04-27 NOTE — Addendum Note (Signed)
Addended by: Philipp Deputy on: 04/27/2023 05:37 PM   Modules accepted: Orders

## 2023-04-27 NOTE — Progress Notes (Signed)
FOLLOW UP  Date of Service/Encounter:  04/27/23   Assessment:   Moderate persistent asthma, uncomplicated - restarting Tezspire    Seasonal and perennial allergic rhinitis (grasses, ragweed, weeds, trees, indoor molds, outdoor molds and cat) - restarting allergen immunotherapy   Anaphylactic shock due to food (tree nuts) - with negative skin and blood testing (successfully introduced tree nuts back into the diet)   Plan/Recommendations:   Moderate persistent asthma - Lung testing looked superb today. - Tezspire sample given today.  - We will get that back on track.  - Daily controller medication(s): Singulair 10mg  daily and Breztri two puffs twice daily with spacer and Tezspire every month - Monthly controller medication: Tezspire injection monthly.  Given dose today and will do reapproval - Prior to physical activity: Xopenex 2 puffs 10-15 minutes before physical activity. - Rescue medications: Xopenex 4 puffs every 4-6 hours as needed - Asthma control goals:  * Full participation in all desired activities (may need albuterol before activity) * Albuterol use two time or less a week on average (not counting use with activity) * Cough interfering with sleep two time or less a month * Oral steroids no more than once a year * No hospitalizations  Chronic rhinitis (grasses, ragweed, weeds, trees, indoor molds, outdoor molds and cat) - Continue with: Singulair (montelukast) 10mg  daily and Astelin (azelastine) two sprays per nostril daily and Allegra (fexofenadine) 180mg  table once daily and Astelin (azelastine) 2 sprays per nostril 1-2 times daily as needed - Make an appointment to start shots in three weeks. - We will remix them at that time.  - Once you have tolerated one injection again, we can get them at the same time as the Tezspire.    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   Return in about 6 months (around 10/28/2023). You can have the  follow up appointment with Dr. Dellis Anes or a Nurse Practicioner (our Nurse Practitioners are excellent and always have Physician oversight!).   Subjective:   Sergio [name] is a 47 y.o. male presenting today for follow up of  Chief Complaint  Patient presents with   Follow-up   Cough   Nasal Congestion    Sergio [name] has a history of the following: Patient Active Problem List   Diagnosis Date Noted   Tinea corporis 09/01/2022   Atypical nevi 09/01/2022   Encounter for general adult medical examination with abnormal findings 04/17/2022   GAD (generalized anxiety disorder) 04/13/2022   Hypothyroidism 10/13/2021   Plantar fasciitis of right foot 12/09/2020   Anaphylactic shock due to adverse food reaction 05/23/2019   Moderate persistent asthma, uncomplicated 05/23/2019   Seasonal and perennial allergic rhinitis 05/23/2019   Migraine 10/19/2018   Pain of left thumb 04/12/2015   Obesity (BMI 30.0-34.9) 03/03/2013   Gastroesophageal reflux disease with hiatal hernia 05/06/2012   Essential hypertension 05/06/2012   Irritable bowel syndrome with constipation and diarrhea 05/06/2012   Obstructive sleep apnea 11/19/2008   Hyperlipidemia 03/11/2008   Asthma with allergic rhinitis 03/11/2008    History obtained from: chart review and patient.  Sergio [name] is a 47 y.o. male presenting for a follow up visit.  He was last seen in August 2023.  At that time, he was having an increase in his symptoms with his recent COVID-19 infection.  He was continued on Singulair 10 mg daily and Breztri 2 puffs twice daily.  He was also continued on Tezspire monthly.  For  his rhinitis, he was continued on Singulair as well as Astelin and Allegra.  He continued to consume tree nuts.  Since last visit, he has done well. He did have COVID again recently and he has not gotten his Dorothea Ogle because of this.  Asthma/Respiratory Symptom History: He has not had Tezspire in a couple of months and he was off track.  He did think that it worked  when he was getting it. He did Paxlovid every time for this. He is on Breztri two puffs twice daily. The copay cards covers all of this.   Allergic Rhinitis Symptom History: Environmental allergies are not as well controlled. He has been  interested in restarting allergy shots.  He talked to Dr. Delorse Lek about it at the last visit, but she wanted to get his breathing under better control first.  He does get out of work at [employer], therefore coming to get shots will not be an issue.  Food Allergy Symptom History: He continues to give tree nuts in his diet.  It is not his favorite food, but he is glad that he does not have to worry about consuming it anymore.  Otherwise, there have been no changes to his past medical history, surgical history, family history, or social history.    Review of systems otherwise negative other than that mentioned in the HPI.    Objective:   Blood pressure 120/80, pulse 99, temperature 98.4 F (36.9 C), height 5\' 10"  (1.778 m), weight 228 lb 4.8 oz (103.6 kg), SpO2 93%. Body mass index is 32.76 kg/m.    Physical Exam Vitals reviewed.  Constitutional:      Appearance: He is well-developed.  HENT:     Head: Normocephalic and atraumatic.     Right Ear: Tympanic membrane, ear canal and external ear normal.     Left Ear: Tympanic membrane, ear canal and external ear normal.     Nose: No nasal deformity, septal deviation, mucosal edema or rhinorrhea.     Right Turbinates: Enlarged and swollen.     Left Turbinates: Enlarged and swollen.     Right Sinus: No maxillary sinus tenderness or frontal sinus tenderness.     Left Sinus: No maxillary sinus tenderness or frontal sinus tenderness.     Mouth/Throat:     Mouth: Mucous membranes are not pale and not dry.     Pharynx: Uvula midline.  Eyes:     General: Lids are normal. No allergic shiner.       Right eye: No discharge.        Left eye: No discharge.     Conjunctiva/sclera:  Conjunctivae normal.     Right eye: Right conjunctiva is not injected. No chemosis.    Left eye: Left conjunctiva is not injected. No chemosis.    Pupils: Pupils are equal, round, and reactive to light.  Cardiovascular:     Rate and Rhythm: Normal rate and regular rhythm.     Heart sounds: Normal heart sounds.  Pulmonary:     Effort: Pulmonary effort is normal. No tachypnea, accessory muscle usage or respiratory distress.     Breath sounds: Normal breath sounds. No wheezing, rhonchi or rales.  Chest:     Chest wall: No tenderness.  Lymphadenopathy:     Cervical: No cervical adenopathy.  Skin:    Coloration: Skin is not pale.     Findings: No abrasion, erythema, petechiae or rash. Rash is not papular, urticarial or vesicular.  Neurological:     Mental Status:  He is alert.  Psychiatric:        Behavior: Behavior is cooperative.      Diagnostic studies:    Spirometry: results normal (FEV1: 3.51/89%, FVC: 4.18/83%, FEV1/FVC: 84%).    Spirometry consistent with normal pattern.   Allergy Studies: none        Malachi Bonds, MD  [facility]

## 2023-04-27 NOTE — Patient Instructions (Addendum)
Moderate persistent asthma - Lung testing looked superb today. - Tezspire sample given today.  - We will get that back on track.  - Daily controller medication(s): Singulair 10mg  daily and Breztri two puffs twice daily with spacer and Tezspire every month - Monthly controller medication: Tezspire injection monthly.  Given dose today and will do reapproval - Prior to physical activity: Xopenex 2 puffs 10-15 minutes before physical activity. - Rescue medications: Xopenex 4 puffs every 4-6 hours as needed - Asthma control goals:  * Full participation in all desired activities (may need albuterol before activity) * Albuterol use two time or less a week on average (not counting use with activity) * Cough interfering with sleep two time or less a month * Oral steroids no more than once a year * No hospitalizations  Chronic rhinitis (grasses, ragweed, weeds, trees, indoor molds, outdoor molds and cat) - Continue with: Singulair (montelukast) 10mg  daily and Astelin (azelastine) two sprays per nostril daily and Allegra (fexofenadine) 180mg  tablet - Make an appointment to start shots in three weeks. - We will remix them at that time.  - Once you have tolerated one injection again, we can get them at the same time as the Tezspire.    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   Return in about 6 months (around 10/28/2023). You can have the follow up appointment with Dr. Dellis Anes or a Nurse Practicioner (our Nurse Practitioners are excellent and always have Physician oversight!).    Please inform us of any Emergency Department visits, hospitalizations, or changes in symptoms. Call us before going to the ED for breathing or allergy symptoms since we might be able to fit you in for a sick visit. Feel free to contact us anytime with any questions, problems, or concerns.  It was a pleasure to see you again today!  Websites that have reliable patient information: 1.  American Academy of Asthma, Allergy, and Immunology: www.aaaai.org 2. Food Allergy Research and Education (FARE): foodallergy.org 3. Mothers of Asthmatics: http://www.asthmacommunitynetwork.org 4. American College of Allergy, Asthma, and Immunology: www.acaai.org   COVID-19 Vaccine Information can be found at: PodExchange.nl For questions related to vaccine distribution or appointments, please email [email or web address] or call [phone].   We realize that you might be concerned about having an allergic reaction to the COVID19 vaccines. To help with that concern, WE ARE OFFERING THE COVID19 VACCINES IN OUR OFFICE! Ask the front desk for dates!     "Like" [place] on Facebook and Instagram for our latest updates!      A healthy democracy works best when [employer] participate! Make sure you are registered to vote! If you have moved or changed any of your contact information, you will need to get this updated before voting!  In some cases, you MAY be able to register to vote online: [email or web address]

## 2023-04-28 ENCOUNTER — Other Ambulatory Visit (HOSPITAL_COMMUNITY): Payer: Self-pay

## 2023-04-28 ENCOUNTER — Ambulatory Visit: Payer: Commercial Managed Care - PPO | Attending: Internal Medicine | Admitting: Pharmacist

## 2023-04-28 ENCOUNTER — Telehealth: Payer: Self-pay | Admitting: *Deleted

## 2023-04-28 ENCOUNTER — Other Ambulatory Visit: Payer: Self-pay

## 2023-04-28 DIAGNOSIS — Z79899 Other long term (current) drug therapy: Secondary | ICD-10-CM

## 2023-04-28 MED ORDER — TEZSPIRE 210 MG/1.91ML ~~LOC~~ SOSY
210.0000 mg | PREFILLED_SYRINGE | SUBCUTANEOUS | 11 refills | Status: AC
  Filled 2023-04-28 – 2023-05-24 (×2): qty 1.91, 28d supply, fill #0
  Filled 2023-06-15: qty 1.91, 28d supply, fill #1
  Filled 2023-07-08: qty 1.91, 28d supply, fill #2
  Filled 2023-08-09: qty 1.91, 28d supply, fill #3
  Filled 2023-09-07: qty 1.91, 28d supply, fill #4
  Filled 2023-11-30 (×2): qty 1.91, 28d supply, fill #5

## 2023-04-28 MED ORDER — TEZSPIRE 210 MG/1.91ML ~~LOC~~ SOSY
210.0000 mg | PREFILLED_SYRINGE | SUBCUTANEOUS | 11 refills | Status: DC
Start: 2023-04-28 — End: 2023-04-28
  Filled 2023-04-28: qty 1.91, 28d supply, fill #0

## 2023-04-28 NOTE — Telephone Encounter (Signed)
-----   Message from Alfonse Spruce sent at 04/27/2023  5:07 PM EDT ----- Restarted Tezspire today. He had an inactive one that we gave.

## 2023-04-28 NOTE — Progress Notes (Signed)
NOT MADE UNTIL APPT IS SCHED. 

## 2023-04-28 NOTE — Progress Notes (Signed)
   S: Patient presents today for review of their specialty medication.    Patient is currently taking Tezspire for asthma. Patient is managed by Dr. Dellis Anes this. Of note, I saw him last June, at which time he started [place]. Since then he has covid a couple of times and went off of the Tezspire due to this. He is now restarting therapy.    Dosing: 210 mg subq q28days   Adherence: confirmed   Efficacy: worked well when he took previously.    Monitoring:  S/Sx of hypersensitivity: none PFTs: monitored by Dr. Ellouise Newer office, last taken 04/27/23 S/sx of infection: none    Current adverse effects: none reported   O:     Lab Results  Component Value Date   WBC 6.9 04/13/2022   HGB 15.9 04/13/2022   HCT 46.9 04/13/2022   MCV 84 04/13/2022   PLT 265 04/13/2022      Chemistry      Component Value Date/Time   NA 142 04/13/2022 0845   K 5.0 04/13/2022 0845   CL 100 04/13/2022 0845   CO2 27 04/13/2022 0845   BUN 13 04/13/2022 0845   CREATININE 1.08 04/13/2022 0845   CREATININE 1.11 04/01/2021 1756      Component Value Date/Time   CALCIUM 9.7 04/13/2022 0845   ALKPHOS 74 04/13/2022 0845   AST 14 04/13/2022 0845   ALT 16 04/13/2022 0845   BILITOT 0.4 04/13/2022 0845       A/P: 1. Medication review: patient is restarting Tezspire for asthma. We discussed the medication including the following: Tezepelumab-ekko is a human monoclonal antibody IgG2? that binds to human thymic stromal lymphopoietin (TSLP), an epithelial cytokine, and prevents human TSLP from interacting with the heterodimeric TSLP receptor. Blocking TSLP with tezepelumab-ekko reduces biomarkers and cytokines associated with inflammation including blood eosinophils, airway submucosal eosinophils, IgE, FeNO, IL-5, and IL-13; however, the mechanism of tezepelumab-ekko action in asthma has not been definitively established. Prior to administration, remove the vial or pre-filled syringe from the refrigerator and  allow to reach room temperature for ~60 minutes (do not shake or expose to heat). Administer as a SUBQ injection into the upper arm, thigh, or lower abdomen (avoiding areas within 2 inches of navel). Rotate injection sites; do not inject into areas where the skin is tender, bruised, erythematous, or hardened. Adverse effects are uncommon but may include antibody development, joint pain, back pain, or pharyngitis. Patients should monitor for hypersensitivity reactions and infections. All questions and concerns were addressed. No recommendations for any changes at this time.    Butch Penny, PharmD, Patsy [place], CPP Clinical Pharmacist [facility] [phone]

## 2023-04-28 NOTE — Progress Notes (Signed)
Aeroallergen Immunotherapy   Ordering Provider: Dr. Malachi Bonds   Patient Details  Name: Sergio [name]  MRN: [id number]  Date of Birth: 31-Mar-1976   Order 1 of 2   Vial Label: RW/Molds   0.6 ml (Volume)  1:20 Concentration -- Ragweed Mix  0.2 ml (Volume)  1:20 Concentration -- Alternaria alternata  0.2 ml (Volume)  1:20 Concentration -- Cladosporium herbarum  0.2 ml (Volume)  1:10 Concentration -- Aspergillus mix  0.2 ml (Volume)  1:10 Concentration -- Penicillium mix  0.2 ml (Volume)  1:20 Concentration -- Bipolaris sorokiniana  0.2 ml (Volume)  1:20 Concentration -- Drechslera spicifera  0.2 ml (Volume)  1:10 Concentration -- Mucor plumbeus  0.2 ml (Volume)  1:10 Concentration -- Fusarium moniliforme  0.2 ml (Volume)  1:40 Concentration -- Aureobasidium pullulans  0.2 ml (Volume)  1:10 Concentration -- Rhizopus oryzae    2.6  ml Extract Subtotal  2.4  ml Diluent  5.0  ml Maintenance Total   Schedule:  B   Blue Vial (1:100,000): Schedule B (6 doses)  Yellow Vial (1:10,000): Schedule B (6 doses)  Green Vial (1:1,000): Schedule B (6 doses)  Red Vial (1:100): Schedule A (14 doses)   Special Instructions: After completion of the first Red Vial, please space to every two weeks. After completion of the second Red Vial, please space to every 4 weeks. Ok to up dose new vials at 0.59mL --> 0.3 mL --> 0.5 mL. Ok to come twice weekly, if desired, as long as there is 48 hours between injections.

## 2023-04-28 NOTE — Telephone Encounter (Signed)
Called patient and advised approval to restart Tezspire and scheduled for next injection in clinic

## 2023-04-28 NOTE — Progress Notes (Signed)
Aeroallergen Immunotherapy   Ordering Provider: Dr. Malachi Bonds   Patient Details  Name: Sergio [name]  MRN: [id number]  Date of Birth: 10-02-1975   Order 2 of 2   Vial Label: G/W/T/C   0.3 ml (Volume)  BAU Concentration -- [place] 10,000  0.2 ml (Volume)  1:20 Concentration -- [place]  0.2 ml (Volume)  1:20 Concentration -- [place]*  0.2 ml (Volume)  1:20 Concentration -- [place] elder, Rough*  0.5 ml (Volume)  1:20 Concentration -- Weed Mix*  0.5 ml (Volume)  1:20 Concentration -- Eastern 10 Tree Mix (also Sweet Gum)  0.2 ml (Volume)  1:10 Concentration -- Cedar, red  0.2 ml (Volume)  1:20 Concentration -- [place], Eastern*  0.8 ml (Volume)  1:10 Concentration -- Cat Hair    3.1  ml Extract Subtotal  1.9  ml Diluent  5.0  ml Maintenance Total   Schedule:  B   Blue Vial (1:100,000): Schedule B (6 doses)  Yellow Vial (1:10,000): Schedule B (6 doses)  Green Vial (1:1,000): Schedule B (6 doses)  Red Vial (1:100): Schedule A (14 doses)   Special Instructions: After completion of the first Red Vial, please space to every two weeks. After completion of the second Red Vial, please space to every 4 weeks. Ok to up dose new vials at 0.70mL --> 0.3 mL --> 0.5 mL. Ok to come twice weekly, if desired, as long as there is 48 hours between injections.

## 2023-04-28 NOTE — Telephone Encounter (Signed)
Thanks, Tammy!   Joel Gallagher, MD [facility]  

## 2023-04-30 ENCOUNTER — Other Ambulatory Visit (HOSPITAL_COMMUNITY): Payer: Self-pay

## 2023-05-06 ENCOUNTER — Ambulatory Visit: Payer: Commercial Managed Care - PPO | Admitting: Internal Medicine

## 2023-05-06 ENCOUNTER — Encounter: Payer: Self-pay | Admitting: Internal Medicine

## 2023-05-06 VITALS — BP 124/88 | HR 87 | Ht 70.0 in | Wt 227.6 lb

## 2023-05-06 DIAGNOSIS — B001 Herpesviral vesicular dermatitis: Secondary | ICD-10-CM | POA: Diagnosis not present

## 2023-05-06 DIAGNOSIS — E782 Mixed hyperlipidemia: Secondary | ICD-10-CM | POA: Diagnosis not present

## 2023-05-06 DIAGNOSIS — J4550 Severe persistent asthma, uncomplicated: Secondary | ICD-10-CM

## 2023-05-06 DIAGNOSIS — F411 Generalized anxiety disorder: Secondary | ICD-10-CM | POA: Diagnosis not present

## 2023-05-06 DIAGNOSIS — Z0001 Encounter for general adult medical examination with abnormal findings: Secondary | ICD-10-CM

## 2023-05-06 DIAGNOSIS — E039 Hypothyroidism, unspecified: Secondary | ICD-10-CM

## 2023-05-06 DIAGNOSIS — R739 Hyperglycemia, unspecified: Secondary | ICD-10-CM

## 2023-05-06 DIAGNOSIS — I10 Essential (primary) hypertension: Secondary | ICD-10-CM | POA: Diagnosis not present

## 2023-05-06 DIAGNOSIS — K219 Gastro-esophageal reflux disease without esophagitis: Secondary | ICD-10-CM

## 2023-05-06 DIAGNOSIS — G43919 Migraine, unspecified, intractable, without status migrainosus: Secondary | ICD-10-CM

## 2023-05-06 DIAGNOSIS — E559 Vitamin D deficiency, unspecified: Secondary | ICD-10-CM | POA: Diagnosis not present

## 2023-05-06 DIAGNOSIS — E669 Obesity, unspecified: Secondary | ICD-10-CM | POA: Diagnosis not present

## 2023-05-06 DIAGNOSIS — Z6832 Body mass index (BMI) 32.0-32.9, adult: Secondary | ICD-10-CM

## 2023-05-06 DIAGNOSIS — K449 Diaphragmatic hernia without obstruction or gangrene: Secondary | ICD-10-CM

## 2023-05-06 NOTE — Assessment & Plan Note (Signed)
Well-controlled with Buspar 5 mg BID PRN

## 2023-05-06 NOTE — Assessment & Plan Note (Addendum)
BP Readings from Last 1 Encounters:  05/06/23 124/88   Well-controlled with Hyzaar Counseled for compliance with the medications Advised DASH diet and moderate exercise/walking, at least 150 mins/week

## 2023-05-06 NOTE — Assessment & Plan Note (Signed)
On statin Check lipid profile 

## 2023-05-06 NOTE — Assessment & Plan Note (Signed)
Lab Results  Component Value Date   TSH 1.890 04/13/2022   On NP thyroid 90 mg daily Will continue for now Check TSH and free T4

## 2023-05-06 NOTE — Assessment & Plan Note (Addendum)
Well controlled with Symbicort Uses azelastine spray for allergic rhinitis On Tezspire now On Zyrtec and Singulair for allergies Followed by allergy and immunology clinic

## 2023-05-06 NOTE — Assessment & Plan Note (Signed)
Usually self resolving If persistent, will prescribe valacyclovir

## 2023-05-06 NOTE — Assessment & Plan Note (Signed)

## 2023-05-06 NOTE — Assessment & Plan Note (Signed)
BMI Readings from Last 2 Encounters:  05/06/23 32.66 kg/m  04/27/23 32.76 kg/m   Diet modification and moderate exercise advised Referred to healthy weight and wellness clinic as per patient request

## 2023-05-06 NOTE — Assessment & Plan Note (Signed)
Well controlled with zonisamide Followed by neurology

## 2023-05-06 NOTE — Assessment & Plan Note (Signed)
Well-controlled with pantoprazole ?

## 2023-05-06 NOTE — Patient Instructions (Signed)
Please continue to take medications as prescribed. ? ?Please continue to follow low carb diet and perform moderate exercise/walking at least 150 mins/week. ?

## 2023-05-06 NOTE — Progress Notes (Signed)
Established Patient Office Visit  Subjective:  Patient ID: Sergio [name], male    DOB: [birth year]  Age: 47 y.o. MRN: [id number]  CC:  Chief Complaint  Patient presents with   Annual Exam    Annual physical    Mouth Lesions    Cold sore since 05/04/23   Weight Loss    Patient would like referral to weight and wellness in [place]     HPI Sergio [name] is a 47 y.o. male with past medical history of HTN, migraine, asthma, OSA, GERD, IBS and HLD who presents for annual physical.  HTN: BP is well-controlled. Takes medications regularly. Patient denies headache, dizziness, chest pain, dyspnea or palpitations.  He also takes Zocor for HLD.  Asthma and allergic rhinitis: He uses Symbicort and as needed Xopenex for asthma.  Denies any dyspnea or wheezing currently.  He uses azelastine nasal spray as needed for allergies.  He also takes Zyrtec and Singulair for allergies.  He is on Tezspire now.   He has a history of migraine, for which he takes zonisamide.  He follows up with neurology.  He uses CPAP for OSA.  Denies any major discomfort currently.  Obesity: His weight has been stable despite trying low-carb diet and exercise at times.  He requests referral to healthy weight and wellness clinic.  He has a cold sore over lower lip  for the last 3 days.  Denies any severe burning currently.  Denies any other rash.   Past Medical History:  Diagnosis Date   Asthma with allergic rhinitis 03/11/2008   Essential hypertension 05/06/2012   GAD (generalized anxiety disorder) 04/13/2022   Gastroesophageal reflux disease with hiatal hernia 05/06/2012   Gastroesophageal reflux disease with hiatal hernia    Hyperlipidemia LDL goal < 160 03/11/2008   Hypothyroidism 10/13/2021   Irritable bowel syndrome with constipation and diarrhea 05/06/2012   Migraine    Obesity (BMI 30.0-34.9) 03/03/2013   Obstructive sleep apnea 11/19/2008   Nocturnal polysomnography 12/14/2008: AHI 20.8, O2 Sat nadir 84%,  optimal CPAP 12 cm H2O    Urticaria     Past Surgical History:  Procedure Laterality Date   COLONOSCOPY WITH PROPOFOL N/A 08/06/2021   Procedure: COLONOSCOPY WITH PROPOFOL;  Surgeon: Dolores Frame, MD;  Location: [facility];  Service: Gastroenterology;  Laterality: N/A;  915   POLYPECTOMY  08/06/2021   Procedure: POLYPECTOMY;  Surgeon: Marguerita Merles, Reuel Boom, MD;  Location: [facility];  Service: Gastroenterology;;  cecum and ascending   URETHRAL DILATION      Family History  Problem Relation Age of Onset   Early death Father        Accident   Hypertension Maternal Grandmother    Vaginal cancer Maternal Grandmother    Parkinson's disease Maternal Grandmother    Diabetes Paternal Grandfather    ALS Paternal Grandfather    Breast cancer Paternal Grandmother    Kidney cancer Paternal Grandmother    Healthy Mother    Healthy Sister    Healthy Sister    Hypertension Maternal Uncle    Diabetes Paternal Aunt    COPD Maternal Grandfather    Urticaria Neg Hx    Immunodeficiency Neg Hx    Eczema Neg Hx    Atopy Neg Hx    Angioedema Neg Hx    Allergic rhinitis Neg Hx    Asthma Neg Hx     Social History   Socioeconomic History   Marital status: Married    Spouse name:  Not on file   Number of children: 0   Years of education: college   Highest education level: Associate degree: academic program  Occupational History   Occupation: Medical Records  Tobacco Use   Smoking status: Never   Smokeless tobacco: Never  Vaping Use   Vaping status: Never Used  Substance and Sexual Activity   Alcohol use: Yes    Comment: occ   Drug use: No   Sexual activity: Yes    Birth control/protection: None  Other Topics Concern   Not on file  Social History Narrative   Works for [employer] in the [employer].  Married for 23 years, no children.   Right-handed.   Occasionally caffeine use.   Social Determinants of Health   Financial Resource Strain: Low Risk   (05/05/2023)   Overall Financial Resource Strain (CARDIA)    Difficulty of Paying Living Expenses: Not hard at all  Food Insecurity: No Food Insecurity (05/05/2023)   Hunger Vital Sign    Worried About Running Out of Food in the Last Year: Never true    Ran Out of Food in the Last Year: Never true  Transportation Needs: No Transportation Needs (05/05/2023)   PRAPARE - Administrator, Civil Service (Medical): No    Lack of Transportation (Non-Medical): No  Physical Activity: Insufficiently Active (05/05/2023)   Exercise Vital Sign    Days of Exercise per Week: 4 days    Minutes of Exercise per Session: 30 min  Stress: No Stress Concern Present (05/05/2023)   [employer] of Occupational Health - Occupational Stress Questionnaire    Feeling of Stress : Only a little  Social Connections: Socially Integrated (05/05/2023)   Social Connection and Isolation Panel [NHANES]    Frequency of Communication with Friends and Family: Three times a week    Frequency of Social Gatherings with Friends and Family: Twice a week    Attends Religious Services: More than 4 times per year    Active Member of [employer] or Organizations: Yes    Attends Engineer, structural: More than 4 times per year    Marital Status: Married  Catering manager Violence: Not on file    Outpatient Medications Prior to Visit  Medication Sig Dispense Refill   Azelastine HCl 137 MCG/SPRAY SOLN INSTILL 2 SPRAYS INTO BOTH NOSTRILS TWICE DAILY--must have office visit for further refills 30 mL 0   Budeson-Glycopyrrol-Formoterol (BREZTRI AEROSPHERE) 160-9-4.8 MCG/ACT AERO Inhale 2 puffs into the lungs in the morning and at bedtime. 10.7 g 5   busPIRone (BUSPAR) 5 MG tablet Take 1 tablet by mouth 2 times daily. 60 tablet 5   cetirizine (ZYRTEC) 10 MG tablet Take 1 tablet (10 mg total) by mouth daily. 90 tablet 1   Cholecalciferol (VITAMIN D3) 125 MCG (5000 UT) TABS Take 5,000 Units by mouth daily.     diphenhydrAMINE  (BENADRYL) 25 MG tablet Take 50 mg by mouth daily as needed for itching.     EPINEPHrine 0.3 mg/0.3 mL IJ SOAJ injection Inject 0.3 mg into the muscle as needed for anaphylaxis. 2 each 1   hyoscyamine (LEVSIN) 0.125 MG tablet Take 1 tablet (0.125 mg total) by mouth every 6 (six) hours as needed for abdominal pain. 30 tablet 3   levalbuterol (XOPENEX HFA) 45 MCG/ACT inhaler Inhale 4 puffs into the lungs every 4 to 6 hours as needed. 15 g 1   losartan-hydrochlorothiazide (HYZAAR) 100-25 MG tablet Take 1 tablet by mouth daily. 90 tablet 1  montelukast (SINGULAIR) 10 MG tablet Take 1 tablet (10 mg total) by mouth daily. 90 tablet 1   pantoprazole (PROTONIX) 40 MG tablet Take 1 tablet (40 mg total) by mouth daily. 90 tablet 1   scopolamine (TRANSDERM-SCOP) 1 MG/3DAYS Place 1 patch (1.5 mg total) onto the skin every 3 (three) days. 10 patch 1   simvastatin (ZOCOR) 40 MG tablet Take 1 tablet (40 mg total) by mouth at bedtime. 90 tablet 1   tezepelumab-ekko (TEZSPIRE) 210 MG/1. syringe Inject 1.91 mLs (210 mg total) into the skin every 28 (twenty-eight) days. 1.91 mL 11   thyroid (NP THYROID) 90 MG tablet Take 1 tablet (90 mg total) by mouth daily. 90 tablet 1   zonisamide (ZONEGRAN) 100 MG capsule Take 1 capsule (100 mg total) by mouth daily. 30 capsule 2   terbinafine (LAMISIL) 1 % cream Apply topically 2 times daily. 30 g 0   Facility-Administered Medications Prior to Visit  Medication Dose Route Frequency Provider Last Rate Last Admin   tezepelumab-ekko (TEZSPIRE) 210 MG/1. syringe 210 mg  210 mg Subcutaneous Q28 days Verlee Monte, MD   210 mg at 07/22/22 0981    Allergies  Allergen Reactions   Other Hives and Swelling    Cashews     ROS Review of Systems  Constitutional:  Negative for chills and fever.  HENT:  Negative for congestion and sore throat.   Eyes:  Negative for pain and discharge.  Respiratory:  Negative for cough and shortness of breath.   Cardiovascular:  Negative  for chest pain and palpitations.  Gastrointestinal:  Positive for constipation. Negative for nausea and vomiting.  Endocrine: Negative for polydipsia and polyuria.  Genitourinary:  Negative for dysuria and hematuria.  Musculoskeletal:  Negative for neck pain and neck stiffness.  Skin:  Negative for rash.  Allergic/Immunologic: Positive for environmental allergies.  Neurological:  Positive for headaches. Negative for dizziness, weakness and numbness.  Psychiatric/Behavioral:  Negative for agitation and behavioral problems.       Objective:    Physical Exam Vitals reviewed.  Constitutional:      General: He is not in acute distress.    Appearance: He is obese. He is not diaphoretic.  HENT:     Head: Normocephalic and atraumatic.     Nose: Nose normal.     Mouth/Throat:     Lips: Lesions (vesicle below lower lip) present.     Mouth: Mucous membranes are moist.  Eyes:     General: No scleral icterus.    Extraocular Movements: Extraocular movements intact.  Cardiovascular:     Rate and Rhythm: Normal rate and regular rhythm.     Pulses: Normal pulses.     Heart sounds: Normal heart sounds. No murmur heard. Pulmonary:     Breath sounds: Normal breath sounds. No wheezing or rales.  Abdominal:     Palpations: Abdomen is soft.     Tenderness: There is no abdominal tenderness.  Musculoskeletal:     Cervical back: Neck supple. No tenderness.     Right lower leg: No edema.     Left lower leg: No edema.  Skin:    General: Skin is warm.     Findings: No rash.  Neurological:     General: No focal deficit present.     Mental Status: He is alert and oriented to person, place, and time.     Sensory: No sensory deficit.     Motor: No weakness.  Psychiatric:  Mood and Affect: Mood normal.        Behavior: Behavior normal.     BP 124/88 (BP Location: Right Arm)   Pulse 87   Ht 5\' 10"  (1.778 m)   Wt 227 lb 9.6 oz (103.2 kg)   SpO2 95%   BMI 32.66 kg/m  Wt Readings from  Last 3 Encounters:  05/06/23 227 lb 9.6 oz (103.2 kg)  04/27/23 228 lb 4.8 oz (103.6 kg)  09/01/22 225 lb (102.1 kg)    Lab Results  Component Value Date   TSH 1.890 04/13/2022   Lab Results  Component Value Date   WBC 6.9 04/13/2022   HGB 15.9 04/13/2022   HCT 46.9 04/13/2022   MCV 84 04/13/2022   PLT 265 04/13/2022   Lab Results  Component Value Date   NA 142 04/13/2022   K 5.0 04/13/2022   CO2 27 04/13/2022   GLUCOSE 107 (H) 04/13/2022   BUN 13 04/13/2022   CREATININE 1.08 04/13/2022   BILITOT 0.4 04/13/2022   ALKPHOS 74 04/13/2022   AST 14 04/13/2022   ALT 16 04/13/2022   PROT 6.9 04/13/2022   ALBUMIN 4.5 04/13/2022   CALCIUM 9.7 04/13/2022   ANIONGAP 7 08/04/2021   EGFR 85 04/13/2022   Lab Results  Component Value Date   CHOL 153 04/13/2022   Lab Results  Component Value Date   HDL 40 04/13/2022   Lab Results  Component Value Date   LDLCALC 94 04/13/2022   Lab Results  Component Value Date   TRIG 102 04/13/2022   Lab Results  Component Value Date   CHOLHDL 3.8 04/13/2022   Lab Results  Component Value Date   HGBA1C 5.4 04/13/2022      Assessment & Plan:   Problem List Items Addressed This Visit       Cardiovascular and Mediastinum   Essential hypertension (Chronic)    BP Readings from Last 1 Encounters:  05/06/23 124/88   Well-controlled with Hyzaar Counseled for compliance with the medications Advised DASH diet and moderate exercise/walking, at least 150 mins/week      Relevant Orders   CMP14+EGFR   CBC with Differential/Platelet   Migraine    Well controlled with zonisamide Followed by neurology      Relevant Orders   CMP14+EGFR   CBC with Differential/Platelet     Respiratory   Asthma with allergic rhinitis (Chronic)    Well controlled with Symbicort Uses azelastine spray for allergic rhinitis On Tezspire now On Zyrtec and Singulair for allergies Followed by allergy and immunology clinic      Relevant Orders    CMP14+EGFR   CBC with Differential/Platelet   Gastroesophageal reflux disease with hiatal hernia (Chronic)    Well controlled with pantoprazole        Digestive   Cold sore    Usually self resolving If persistent, will prescribe valacyclovir        Endocrine   Hypothyroidism    Lab Results  Component Value Date   TSH 1.890 04/13/2022   On NP thyroid 90 mg daily Will continue for now Check TSH and free T4      Relevant Orders   TSH + free T4     Other   Hyperlipidemia (Chronic)    On statin Check lipid profile      Relevant Orders   Lipid panel   Class 1 obesity with serious comorbidity and body mass index (BMI) of 32.0 to 32.9 in adult    BMI  Readings from Last 2 Encounters:  05/06/23 32.66 kg/m  04/27/23 32.76 kg/m   Diet modification and moderate exercise advised Referred to healthy weight and wellness clinic as per patient request      Relevant Orders   Amb Ref to Medical Weight Management   GAD (generalized anxiety disorder)    Well-controlled with Buspar 5 mg BID PRN      Encounter for general adult medical examination with abnormal findings - Primary    Physical exam as documented. Counseling done  re healthy lifestyle involving commitment to 150 minutes exercise per week, heart healthy diet, and attaining healthy weight.The importance of adequate sleep also discussed. Changes in health habits are decided on by the patient with goals and time frames  set for achieving them. Immunization and cancer screening needs are specifically addressed at this visit.      Other Visit Diagnoses     Vitamin D deficiency       Relevant Orders   VITAMIN D 25 Hydroxy (Vit-D Deficiency, Fractures)   Hyperglycemia       Relevant Orders   Hemoglobin A1c       No orders of the defined types were placed in this encounter.   Follow-up: Return in about 6 months (around 11/06/2023) for HTN and hypothyroidism.    Anabel Halon, MD

## 2023-05-07 LAB — VITAMIN D 25 HYDROXY (VIT D DEFICIENCY, FRACTURES): Vit D, 25-Hydroxy: 54.4 ng/mL (ref 30.0–100.0)

## 2023-05-07 LAB — CMP14+EGFR
ALT: 18 IU/L (ref 0–44)
AST: 15 IU/L (ref 0–40)
Albumin: 4.5 g/dL (ref 4.1–5.1)
Alkaline Phosphatase: 74 IU/L (ref 44–121)
BUN/Creatinine Ratio: 13 (ref 9–20)
BUN: 12 mg/dL (ref 6–24)
Bilirubin Total: 0.4 mg/dL (ref 0.0–1.2)
CO2: 26 mmol/L (ref 20–29)
Calcium: 9.6 mg/dL (ref 8.7–10.2)
Chloride: 100 mmol/L (ref 96–106)
Creatinine, Ser: 0.96 mg/dL (ref 0.76–1.27)
Globulin, Total: 2.6 g/dL (ref 1.5–4.5)
Glucose: 102 mg/dL — ABNORMAL HIGH (ref 70–99)
Potassium: 4.2 mmol/L (ref 3.5–5.2)
Sodium: 140 mmol/L (ref 134–144)
Total Protein: 7.1 g/dL (ref 6.0–8.5)
eGFR: 97 mL/min/{1.73_m2} (ref >59)

## 2023-05-07 LAB — CBC WITH DIFFERENTIAL/PLATELET
Basophils Absolute: 0 10*3/uL (ref 0.0–0.2)
Basos: 1 %
EOS (ABSOLUTE): 0 10*3/uL (ref 0.0–0.4)
Eos: 0 %
Hematocrit: 48.5 % (ref 37.5–51.0)
Hemoglobin: 16.4 g/dL (ref 13.0–17.7)
Immature Grans (Abs): 0 10*3/uL (ref 0.0–0.1)
Immature Granulocytes: 0 %
Lymphocytes Absolute: 2.2 10*3/uL (ref 0.7–3.1)
Lymphs: 26 %
MCH: 28.3 pg (ref 26.6–33.0)
MCHC: 33.8 g/dL (ref 31.5–35.7)
MCV: 84 fL (ref 79–97)
Monocytes Absolute: 0.7 10*3/uL (ref 0.1–0.9)
Monocytes: 9 %
Neutrophils Absolute: 5.3 10*3/uL (ref 1.4–7.0)
Neutrophils: 64 %
Platelets: 291 10*3/uL (ref 150–450)
RBC: 5.79 x10E6/uL (ref 4.14–5.80)
RDW: 13.4 % (ref 11.6–15.4)
WBC: 8.3 10*3/uL (ref 3.4–10.8)

## 2023-05-07 LAB — TSH+FREE T4
Free T4: 1.09 ng/dL (ref 0.82–1.77)
TSH: 0.954 u[IU]/mL (ref 0.450–4.500)

## 2023-05-07 LAB — LIPID PANEL
Chol/HDL Ratio: 4.2 ratio (ref 0.0–5.0)
Cholesterol, Total: 161 mg/dL (ref 100–199)
HDL: 38 mg/dL — ABNORMAL LOW (ref >39)
LDL Chol Calc (NIH): 101 mg/dL — ABNORMAL HIGH (ref 0–99)
Triglycerides: 123 mg/dL (ref 0–149)
VLDL Cholesterol Cal: 22 mg/dL (ref 5–40)

## 2023-05-07 LAB — HEMOGLOBIN A1C
Est. average glucose Bld gHb Est-mCnc: 114 mg/dL
Hgb A1c MFr Bld: 5.6 % (ref 4.8–5.6)

## 2023-05-24 ENCOUNTER — Other Ambulatory Visit (HOSPITAL_COMMUNITY): Payer: Self-pay

## 2023-05-24 ENCOUNTER — Other Ambulatory Visit: Payer: Self-pay

## 2023-05-25 ENCOUNTER — Ambulatory Visit (INDEPENDENT_AMBULATORY_CARE_PROVIDER_SITE_OTHER): Payer: Commercial Managed Care - PPO

## 2023-05-25 DIAGNOSIS — J4550 Severe persistent asthma, uncomplicated: Secondary | ICD-10-CM

## 2023-06-01 DIAGNOSIS — J302 Other seasonal allergic rhinitis: Secondary | ICD-10-CM | POA: Diagnosis not present

## 2023-06-01 NOTE — Progress Notes (Signed)
VIALS EXP 05-31-24

## 2023-06-02 DIAGNOSIS — J3081 Allergic rhinitis due to animal (cat) (dog) hair and dander: Secondary | ICD-10-CM | POA: Diagnosis not present

## 2023-06-04 ENCOUNTER — Other Ambulatory Visit: Payer: Self-pay

## 2023-06-15 ENCOUNTER — Other Ambulatory Visit: Payer: Self-pay

## 2023-06-15 ENCOUNTER — Ambulatory Visit (INDEPENDENT_AMBULATORY_CARE_PROVIDER_SITE_OTHER): Payer: Commercial Managed Care - PPO | Admitting: *Deleted

## 2023-06-15 DIAGNOSIS — J309 Allergic rhinitis, unspecified: Secondary | ICD-10-CM | POA: Diagnosis not present

## 2023-06-15 NOTE — Progress Notes (Signed)
Specialty Pharmacy Refill Coordination Note  Sergio [name] is a 47 y.o. male contacted today regarding refills of specialty medication(s) Tezepelumab-Ekko   Patient requested Delivery   Delivery date: 06/17/23   [facility]  [address] [place] [address]   Medication will be filled on 06/16/23.

## 2023-06-15 NOTE — Progress Notes (Signed)
Immunotherapy   Patient Details  Name: Sergio [name] MRN: [id number] Date of Birth: 1976/01/10  06/15/2023  Sergio [name] started injections for  G-W-T-C, RW-MOLD Following schedule: B  Frequency:2 times per week Epi-Pen:Epi-Pen Available  Consent signed and patient instructions given. Patient started Allergy Injections and received 0.55mL of G-W-T-C in he RUA and 0.68mL of RW-MOLD in the LUA. Patient waited 30 minutes in office and did not experience any issues.    Geoffry Bannister Fernandez-Vernon 06/15/2023, 3:55 PM

## 2023-06-22 ENCOUNTER — Ambulatory Visit: Payer: Commercial Managed Care - PPO

## 2023-06-23 ENCOUNTER — Ambulatory Visit (INDEPENDENT_AMBULATORY_CARE_PROVIDER_SITE_OTHER): Payer: Commercial Managed Care - PPO | Admitting: Nurse Practitioner

## 2023-06-23 ENCOUNTER — Encounter: Payer: Self-pay | Admitting: Nurse Practitioner

## 2023-06-23 VITALS — BP 114/79 | HR 82 | Temp 98.3°F | Ht 70.0 in | Wt 224.0 lb

## 2023-06-23 DIAGNOSIS — I10 Essential (primary) hypertension: Secondary | ICD-10-CM | POA: Diagnosis not present

## 2023-06-23 DIAGNOSIS — E039 Hypothyroidism, unspecified: Secondary | ICD-10-CM | POA: Diagnosis not present

## 2023-06-23 DIAGNOSIS — Z6832 Body mass index (BMI) 32.0-32.9, adult: Secondary | ICD-10-CM

## 2023-06-23 DIAGNOSIS — E782 Mixed hyperlipidemia: Secondary | ICD-10-CM

## 2023-06-23 DIAGNOSIS — E669 Obesity, unspecified: Secondary | ICD-10-CM

## 2023-06-23 DIAGNOSIS — Z0289 Encounter for other administrative examinations: Secondary | ICD-10-CM

## 2023-06-23 NOTE — Progress Notes (Signed)
Office: [phone]  /  Fax: [phone]   Initial Visit  Sergio [name] was seen in clinic today to evaluate for obesity. He is interested in losing weight to improve overall health and reduce the risk of weight related complications. He presents today to review program treatment options, initial physical assessment, and evaluation.     He was referred by: PCP  When asked what else they would like to accomplish? He states: Adopt healthier eating patterns, Improve existing medical conditions, Improve quality of life, and Lose a target amount of weight : 35-40 lbs  Weight history:  He started gaining weight when he went to college.  His weight has fluctuated over the years.   When asked how has your weight affected you? He states: Having fatigue and Having poor endurance  Some associated conditions: HTN, migraines, asthma, allergies, OSAS, IBS, hypothyroidism, plantar fascitis, GAD, HLD  Contributing factors: Family history, Nutritional, and Life event  Weight promoting medications identified: None  Current nutrition plan: None  Current level of physical activity: Walking  Current or previous pharmacotherapy: None  Response to medication: Never tried medications   Past medical history includes:   Past Medical History:  Diagnosis Date   Asthma with allergic rhinitis 03/11/2008   Essential hypertension 05/06/2012   GAD (generalized anxiety disorder) 04/13/2022   Gastroesophageal reflux disease with hiatal hernia 05/06/2012   Gastroesophageal reflux disease with hiatal hernia    Hyperlipidemia LDL goal < 160 03/11/2008   Hypothyroidism 10/13/2021   Irritable bowel syndrome with constipation and diarrhea 05/06/2012   Migraine    Obesity (BMI 30.0-34.9) 03/03/2013   Obstructive sleep apnea 11/19/2008   Nocturnal polysomnography 12/14/2008: AHI 20.8, O2 Sat nadir 84%, optimal CPAP 12 cm H2O    Urticaria      Objective:   BP 114/79   Pulse 82   Temp 98.3 F (36.8 C)   Ht 5'  10" (1.778 m)   Wt 224 lb (101.6 kg)   SpO2 96%   BMI 32.14 kg/m  He was weighed on the bioimpedance scale: Body mass index is 32.14 kg/m.  Peak Weight:238 lbs , Body Fat%:30.3, Visceral Fat Rating:15, Weight trend over the last 12 months: Increasing  General:  Alert, oriented and cooperative. Patient is in no acute distress.  Respiratory: Normal respiratory effort, no problems with respiration noted   Gait: able to ambulate independently  Mental Status: Normal mood and affect. Normal behavior. Normal judgment and thought content.   DIAGNOSTIC DATA REVIEWED:  BMET    Component Value Date/Time   NA 140 05/06/2023 1611   K 4.2 05/06/2023 1611   CL 100 05/06/2023 1611   CO2 26 05/06/2023 1611   GLUCOSE 102 (H) 05/06/2023 1611   GLUCOSE 111 (H) 08/04/2021 0901   BUN 12 05/06/2023 1611   CREATININE 0.96 05/06/2023 1611   CREATININE 1.11 04/01/2021 1756   CALCIUM 9.6 05/06/2023 1611   GFRNONAA >60 08/04/2021 0901   GFRNONAA 94 07/11/2020 1708   GFRAA 109 07/11/2020 1708   Lab Results  Component Value Date   HGBA1C 5.6 05/06/2023   HGBA1C 5.5 11/13/2019   No results found for: "INSULIN" CBC    Component Value Date/Time   WBC 8.3 05/06/2023 1611   WBC 12.5 (H) 04/01/2021 1756   RBC 5.79 05/06/2023 1611   RBC 5.84 (H) 04/01/2021 1756   HGB 16.4 05/06/2023 1611   HCT 48.5 05/06/2023 1611   PLT 291 05/06/2023 1611   MCV 84 05/06/2023 1611   MCH  28.3 05/06/2023 1611   MCH 28.8 04/01/2021 1756   MCHC 33.8 05/06/2023 1611   MCHC 33.8 04/01/2021 1756   RDW 13.4 05/06/2023 1611   Iron/TIBC/Ferritin/ %Sat No results found for: "IRON", "TIBC", "FERRITIN", "IRONPCTSAT" Lipid Panel     Component Value Date/Time   CHOL 161 05/06/2023 1611   TRIG 123 05/06/2023 1611   HDL 38 (L) 05/06/2023 1611   CHOLHDL 4.2 05/06/2023 1611   CHOLHDL 5.2 (H) 07/11/2020 1708   VLDL 29 03/06/2013 0844   LDLCALC 101 (H) 05/06/2023 1611   LDLCALC 147 (H) 07/11/2020 1708   Hepatic Function  Panel     Component Value Date/Time   PROT 7.1 05/06/2023 1611   ALBUMIN 4.5 05/06/2023 1611   AST 15 05/06/2023 1611   ALT 18 05/06/2023 1611   ALKPHOS 74 05/06/2023 1611   BILITOT 0.4 05/06/2023 1611      Component Value Date/Time   TSH 0.954 05/06/2023 1611     Assessment and Plan:   Essential hypertension Continue follow-up with PCP.  Continue medications as directed.  Hypothyroidism, unspecified type Continue follow-up with PCP.  Continue medications as directed.  Mixed hyperlipidemia Continue follow-up with PCP.  Continue medications as directed.  Generalized obesity  BMI 32.0-32.9,adult        Obesity Treatment / Action Plan:  Patient will work on garnering support from family and friends to begin weight loss journey. Will work on eliminating or reducing the presence of highly palatable, calorie dense foods in the home. Will complete provided nutritional and psychosocial assessment questionnaire before the next appointment. Will be scheduled for indirect calorimetry to determine resting energy expenditure in a fasting state.  This will allow [place] to create a reduced calorie, high-protein meal plan to promote loss of fat mass while preserving muscle mass. Counseled on the health benefits of losing 5%-15% of total body weight. Was counseled on nutritional approaches to weight loss and benefits of reducing processed foods and consuming plant-based foods and high quality protein as part of nutritional weight management. Was counseled on pharmacotherapy and role as an adjunct in weight management.   Obesity Education Performed Today:  He was weighed on the bioimpedance scale and results were discussed and documented in the synopsis.  We discussed obesity as a disease and the importance of a more detailed evaluation of all the factors contributing to the disease.  We discussed the importance of long term lifestyle changes which include nutrition, exercise and  behavioral modifications as well as the importance of customizing this to his specific health and social needs.  We discussed the benefits of reaching a healthier weight to alleviate the symptoms of existing conditions and reduce the risks of the biomechanical, metabolic and psychological effects of obesity.  MAOR MECKEL appears to be in the action stage of change and states they are ready to start intensive lifestyle modifications and behavioral modifications.  30 minutes was spent today on this visit including the above counseling, pre-visit chart review, and post-visit documentation.  Reviewed by clinician on day of visit: allergies, medications, problem list, medical history, surgical history, family history, social history, and previous encounter notes pertinent to obesity diagnosis.    Theodis Sato Rajat Staver FNP-C

## 2023-06-24 ENCOUNTER — Other Ambulatory Visit: Payer: Self-pay

## 2023-06-24 ENCOUNTER — Other Ambulatory Visit (HOSPITAL_COMMUNITY): Payer: Self-pay

## 2023-06-24 ENCOUNTER — Ambulatory Visit: Payer: Commercial Managed Care - PPO

## 2023-06-24 ENCOUNTER — Other Ambulatory Visit: Payer: Self-pay | Admitting: Allergy & Immunology

## 2023-06-24 DIAGNOSIS — J4550 Severe persistent asthma, uncomplicated: Secondary | ICD-10-CM

## 2023-06-24 MED ORDER — AZELASTINE HCL 137 MCG/SPRAY NA SOLN
NASAL | 0 refills | Status: DC
  Filled 2023-06-24: qty 30, 30d supply, fill #0

## 2023-06-25 ENCOUNTER — Other Ambulatory Visit (HOSPITAL_COMMUNITY): Payer: Self-pay

## 2023-06-28 ENCOUNTER — Encounter: Payer: Self-pay | Admitting: Bariatrics

## 2023-06-28 ENCOUNTER — Ambulatory Visit: Payer: Commercial Managed Care - PPO | Admitting: Bariatrics

## 2023-06-28 ENCOUNTER — Other Ambulatory Visit (HOSPITAL_COMMUNITY): Payer: Self-pay

## 2023-06-28 ENCOUNTER — Ambulatory Visit (INDEPENDENT_AMBULATORY_CARE_PROVIDER_SITE_OTHER): Payer: Self-pay

## 2023-06-28 VITALS — BP 128/89 | HR 87 | Temp 97.9°F | Ht 70.0 in | Wt 226.0 lb

## 2023-06-28 DIAGNOSIS — R7309 Other abnormal glucose: Secondary | ICD-10-CM | POA: Insufficient documentation

## 2023-06-28 DIAGNOSIS — J309 Allergic rhinitis, unspecified: Secondary | ICD-10-CM

## 2023-06-28 DIAGNOSIS — E559 Vitamin D deficiency, unspecified: Secondary | ICD-10-CM | POA: Insufficient documentation

## 2023-06-28 DIAGNOSIS — E7849 Other hyperlipidemia: Secondary | ICD-10-CM

## 2023-06-28 DIAGNOSIS — E66811 Obesity, class 1: Secondary | ICD-10-CM

## 2023-06-28 DIAGNOSIS — R5383 Other fatigue: Secondary | ICD-10-CM | POA: Insufficient documentation

## 2023-06-28 DIAGNOSIS — I10 Essential (primary) hypertension: Secondary | ICD-10-CM

## 2023-06-28 DIAGNOSIS — R0602 Shortness of breath: Secondary | ICD-10-CM | POA: Insufficient documentation

## 2023-06-28 DIAGNOSIS — K219 Gastro-esophageal reflux disease without esophagitis: Secondary | ICD-10-CM | POA: Insufficient documentation

## 2023-06-28 DIAGNOSIS — Z0000 Encounter for general adult medical examination without abnormal findings: Secondary | ICD-10-CM | POA: Insufficient documentation

## 2023-06-28 DIAGNOSIS — E782 Mixed hyperlipidemia: Secondary | ICD-10-CM | POA: Diagnosis not present

## 2023-06-28 DIAGNOSIS — E6609 Other obesity due to excess calories: Secondary | ICD-10-CM

## 2023-06-28 DIAGNOSIS — E538 Deficiency of other specified B group vitamins: Secondary | ICD-10-CM | POA: Insufficient documentation

## 2023-06-28 DIAGNOSIS — E038 Other specified hypothyroidism: Secondary | ICD-10-CM

## 2023-06-28 DIAGNOSIS — G4733 Obstructive sleep apnea (adult) (pediatric): Secondary | ICD-10-CM

## 2023-06-28 DIAGNOSIS — Z1331 Encounter for screening for depression: Secondary | ICD-10-CM | POA: Diagnosis not present

## 2023-06-28 DIAGNOSIS — Z6831 Body mass index (BMI) 31.0-31.9, adult: Secondary | ICD-10-CM

## 2023-06-29 LAB — COMPREHENSIVE METABOLIC PANEL
ALT: 18 [IU]/L (ref 0–44)
AST: 15 [IU]/L (ref 0–40)
Albumin: 4.5 g/dL (ref 4.1–5.1)
Alkaline Phosphatase: 77 [IU]/L (ref 44–121)
BUN/Creatinine Ratio: 13 (ref 9–20)
BUN: 13 mg/dL (ref 6–24)
Bilirubin Total: 0.5 mg/dL (ref 0.0–1.2)
CO2: 23 mmol/L (ref 20–29)
Calcium: 9.4 mg/dL (ref 8.7–10.2)
Chloride: 99 mmol/L (ref 96–106)
Creatinine, Ser: 1.03 mg/dL (ref 0.76–1.27)
Globulin, Total: 2.6 g/dL (ref 1.5–4.5)
Glucose: 98 mg/dL (ref 70–99)
Potassium: 4.5 mmol/L (ref 3.5–5.2)
Sodium: 140 mmol/L (ref 134–144)
Total Protein: 7.1 g/dL (ref 6.0–8.5)
eGFR: 89 mL/min/{1.73_m2} (ref >59)

## 2023-06-29 LAB — VITAMIN B12: Vitamin B-12: 242 pg/mL (ref 232–1245)

## 2023-06-29 LAB — INSULIN, RANDOM: INSULIN: 13.5 u[IU]/mL (ref 2.6–24.9)

## 2023-06-29 NOTE — Progress Notes (Signed)
Chief Complaint:   OBESITY Sergio [name] (MR# [id number]) is a 47 y.o. male who presents for evaluation and treatment of obesity and related comorbidities. Current BMI is Body mass index is 32.43 kg/m. Sergio [name] has been struggling with his weight for many years and has been unsuccessful in either losing weight, maintaining weight loss, or reaching his healthy weight goal.  Sergio [name] is currently in the action stage of change and ready to dedicate time achieving and maintaining a healthier weight. Sergio [name] is interested in becoming our patient and working on intensive lifestyle modifications including (but not limited to) diet and exercise for weight loss.  Patient met with Judeth Cornfield, nurse practitioner for an information visit.  Sergio [name]'s habits were reviewed today and are as follows: His family eats meals together, he thinks his family will eat healthier with him, his desired weight loss is 41 lbs, he has been heavy most of his life, he started gaining excessive weight in college, his heaviest weight ever was 236 pounds, he has significant food cravings issues, he snacks frequently in the evenings, he skips meals frequently, he is frequently drinking liquids with calories, he frequently makes poor food choices, and he has problems with excessive hunger.  Depression Screen Sergio [name]'s Food and Mood (modified PHQ-9) score was 7.  Subjective:   1. Other fatigue Sergio [name] admits to daytime somnolence and admits to waking up still tired. Patient has a history of symptoms of daytime fatigue and morning fatigue. Sergio [name] generally gets 5 or 6 hours of sleep per night, and states that he has nightime awakenings and generally restful sleep. Snoring is present. Apneic episodes are not present. Epworth Sleepiness Score is 5.   2. SOB (shortness of breath) on exertion Sergio [name] notes increasing shortness of breath with exercising and seems to be worsening over time with weight gain. He notes getting out of breath sooner  with activity than he used to. This has not gotten worse recently. Sergio [name] denies shortness of breath at rest or orthopnea.  3. Essential hypertension Patient is taking Hyzaar, and his blood pressure is controlled.  4. Gastroesophageal reflux disease, unspecified whether esophagitis present Patient is taking Protonix, and his symptoms are controlled.  5. Other hyperlipidemia Patient is currently taking Zocor.  6. Other specified hypothyroidism Patient is taking thyroid medications.  7. Vitamin D deficiency Patient is taking vitamin D supplementation.  8. B12 deficiency Patient is not currently on vitamin B12 supplementation.  9. Obstructive sleep apnea Patient is using his CPAP nightly.  10. Elevated glucose Patient denies a history of diabetes mellitus.  11. Health care maintenance Given obesity.  Assessment/Plan:   1. Other fatigue Sergio [name] does feel that his weight is causing his energy to be lower than it should be. Fatigue may be related to obesity, depression or many other causes. Labs will be ordered, and in the meanwhile, Sergio [name] will focus on self care including making healthy food choices, increasing physical activity and focusing on stress reduction.  - EKG 12-Lead  2. SOB (shortness of breath) on exertion Sergio [name] does feel that he gets out of breath more easily that he used to when he exercises. Sergio [name]'s shortness of breath appears to be obesity related and exercise induced. He has agreed to work on weight loss and gradually increase exercise to treat his exercise induced shortness of breath. Will continue to monitor closely.  3. Essential hypertension We will check labs today.  Patient will continue his medications, and will work on decreasing added salt.  -  Comprehensive metabolic panel  4. Gastroesophageal reflux disease, unspecified whether esophagitis present Patient will avoid trigger foods, and will continue his medications.  5. Other hyperlipidemia We will  check labs today, and patient will continue his medications.  - Comprehensive metabolic panel  6. Other specified hypothyroidism Patient will continue his medications as directed.  7. Vitamin D deficiency Patient will continue vitamin D supplementation.  8. B12 deficiency We will check labs today, we will follow-up at patient's next visit.  - Vitamin B12  9. Obstructive sleep apnea Patient will continue to use his CPAP nightly.  10. Elevated glucose We will check labs today, we will follow-up at patient's next visit.  - Insulin, random  11. Health care maintenance We will check labs today. EKG and IC were done today and reviewed with the patient.   - Insulin, random - Vitamin B12 - Comprehensive metabolic panel  12. Depression screening Sergio [name] had a positive depression screening. Depression is commonly associated with obesity and often results in emotional eating behaviors. We will monitor this closely and work on CBT to help improve the non-hunger eating patterns. Referral to Psychology may be required if no improvement is seen as he continues in our clinic.  13. Class 1 obesity due to excess calories with serious comorbidity and body mass index (BMI) of 31.0 to 31.9 in adult Sergio [name] is currently in the action stage of change and his goal is to continue with weight loss efforts. I recommend Sergio [name] begin the structured treatment plan as follows:  He has agreed to the Category 3 Plan.  Meal planning was discussed.  Review labs with the patient from 05/06/2023 CMP, lipids, vitamin D, CBC, A1c, glucose, and thyroid panel.  Exercise goals: No exercise has been prescribed at this time.   Behavioral modification strategies: increasing lean protein intake, decreasing simple carbohydrates, increasing vegetables, increasing water intake, decreasing eating out, no skipping meals, meal planning and cooking strategies, keeping healthy foods in the home, and planning for success.  He was  informed of the importance of frequent follow-up visits to maximize his success with intensive lifestyle modifications for his multiple health conditions. He was informed we would discuss his lab results at his next visit unless there is a critical issue that needs to be addressed sooner. Ada agreed to keep his next visit at the agreed upon time to discuss these results.  Objective:   Blood pressure 128/89, pulse 87, temperature 97.9 F (36.6 C), height 5\' 10"  (1.778 m), weight 226 lb (102.5 kg), SpO2 96%. Body mass index is 32.43 kg/m.  EKG: Normal sinus rhythm, rate 78 BPM.  Indirect Calorimeter completed today shows a VO2 of 275 and a REE of 1901.  His calculated basal metabolic rate is 1610 thus his basal metabolic rate is worse than expected.  General: Cooperative, alert, well developed, in no acute distress. HEENT: Conjunctivae and lids unremarkable. Cardiovascular: Regular rhythm.  Lungs: Normal work of breathing. Neurologic: No focal deficits.   Lab Results  Component Value Date   CREATININE 1.03 06/28/2023   BUN 13 06/28/2023   NA 140 06/28/2023   K 4.5 06/28/2023   CL 99 06/28/2023   CO2 23 06/28/2023   Lab Results  Component Value Date   ALT 18 06/28/2023   AST 15 06/28/2023   ALKPHOS 77 06/28/2023   BILITOT 0.5 06/28/2023   Lab Results  Component Value Date   HGBA1C 5.6 05/06/2023   HGBA1C 5.4 04/13/2022   HGBA1C 5.3 04/01/2021   HGBA1C 5.5  11/13/2019   Lab Results  Component Value Date   INSULIN 13.5 06/28/2023   Lab Results  Component Value Date   TSH 0.954 05/06/2023   Lab Results  Component Value Date   CHOL 161 05/06/2023   HDL 38 (L) 05/06/2023   LDLCALC 101 (H) 05/06/2023   TRIG 123 05/06/2023   CHOLHDL 4.2 05/06/2023   Lab Results  Component Value Date   WBC 8.3 05/06/2023   HGB 16.4 05/06/2023   HCT 48.5 05/06/2023   MCV 84 05/06/2023   PLT 291 05/06/2023   No results found for: "IRON", "TIBC", "FERRITIN"  Attestation  Statements:   Reviewed by clinician on day of visit: allergies, medications, problem list, medical history, surgical history, family history, social history, and previous encounter notes.   Trude Mcburney, am acting as Energy manager for [employer], DO.  I have reviewed the above documentation for accuracy and completeness, and I agree with the above. Corinna Capra, DO

## 2023-07-08 ENCOUNTER — Other Ambulatory Visit: Payer: Self-pay

## 2023-07-08 NOTE — Progress Notes (Signed)
Specialty Pharmacy Refill Coordination Note  Sergio [name] is a 47 y.o. male contacted today regarding refills of specialty medication(s) Tezepelumab-Ekko   Patient requested Delivery   Delivery date: 07/14/23   [facility]  [address] [place] [address]   Medication will be filled on 07/13/23.

## 2023-07-12 ENCOUNTER — Encounter: Payer: Self-pay | Admitting: Bariatrics

## 2023-07-12 ENCOUNTER — Ambulatory Visit: Payer: Commercial Managed Care - PPO | Admitting: Bariatrics

## 2023-07-12 VITALS — BP 121/82 | HR 81 | Temp 97.7°F | Ht 70.0 in | Wt 228.0 lb

## 2023-07-12 DIAGNOSIS — E88819 Insulin resistance, unspecified: Secondary | ICD-10-CM | POA: Diagnosis not present

## 2023-07-12 DIAGNOSIS — Z6832 Body mass index (BMI) 32.0-32.9, adult: Secondary | ICD-10-CM

## 2023-07-12 DIAGNOSIS — E538 Deficiency of other specified B group vitamins: Secondary | ICD-10-CM | POA: Diagnosis not present

## 2023-07-12 DIAGNOSIS — E66811 Obesity, class 1: Secondary | ICD-10-CM | POA: Insufficient documentation

## 2023-07-12 DIAGNOSIS — E786 Lipoprotein deficiency: Secondary | ICD-10-CM | POA: Insufficient documentation

## 2023-07-13 NOTE — Progress Notes (Signed)
Chief Complaint:   OBESITY Sergio [name] is here to discuss his progress with his obesity treatment plan along with follow-up of his obesity related diagnoses. Sergio [name] is on the Category 3 Plan and states he is following his eating plan approximately 50% of the time. Sergio [name] states he is walking for 30 minutes 3-4 times per week.  Today's visit was #: 2 Starting weight: 226 lbs Starting date: 06/28/2023 Today's weight: 228 lbs Today's date: 07/12/2023 Total lbs lost to date: 0 Total lbs lost since last in-office visit: 0  Interim History: Patient did not start his meal plan until late and he has gained 2 pounds since his first visit.  Subjective:   1. Low HDL (under 40) Patient is taking Zocor currently.  2. B12 deficiency Patient's recent B12 level was 242.  3. Insulin resistance Patient's A1c on 05/06/2023 was 5.6, and insulin 13.5 on 06/28/2023.  Assessment/Plan:   1. Low HDL (under 40) Patient will continue Zocor, and he will continue with his meal plan and exercise.  Calories and protein sheet was given.  2. B12 deficiency Patient will begin OTC B12 1000 mcg daily sublingual.  3. Insulin resistance Insulin resistance and Prediabetes handouts was given, as well as Eating Out and Travel handouts.  4. Class 1 obesity due to excess calories with serious comorbidity and body mass index (BMI) of 32.0 to 32.9 in adult Sergio [name] is currently in the action stage of change. As such, his goal is to continue with weight loss efforts. He has agreed to the Category 3 Plan and keeping a food journal and adhering to recommended goals of 1500 calories and 90-120 grams of protein daily.   Patient will get on track with his meal plan.  Review labs with the patient from 06/28/2023; CMP, glucose, and insulin.  Patient will have a protein shake daily to substitute for milk.  Exercise goals: As is, but increase speed and add weights for 15 minutes every other day.   Behavioral modification  strategies: increasing lean protein intake, decreasing simple carbohydrates, increasing vegetables, increasing water intake, decreasing eating out, no skipping meals, meal planning and cooking strategies, keeping healthy foods in the home, and planning for success.  Sergio [name] has agreed to follow-up with our clinic in 2 weeks. He was informed of the importance of frequent follow-up visits to maximize his success with intensive lifestyle modifications for his multiple health conditions.   Objective:   Blood pressure 121/82, pulse 81, temperature 97.7 F (36.5 C), height 5\' 10"  (1.778 m), weight 228 lb (103.4 kg), SpO2 97%. Body mass index is 32.71 kg/m.  General: Cooperative, alert, well developed, in no acute distress. HEENT: Conjunctivae and lids unremarkable. Cardiovascular: Regular rhythm.  Lungs: Normal work of breathing. Neurologic: No focal deficits.   Lab Results  Component Value Date   CREATININE 1.03 06/28/2023   BUN 13 06/28/2023   NA 140 06/28/2023   K 4.5 06/28/2023   CL 99 06/28/2023   CO2 23 06/28/2023   Lab Results  Component Value Date   ALT 18 06/28/2023   AST 15 06/28/2023   ALKPHOS 77 06/28/2023   BILITOT 0.5 06/28/2023   Lab Results  Component Value Date   HGBA1C 5.6 05/06/2023   HGBA1C 5.4 04/13/2022   HGBA1C 5.3 04/01/2021   HGBA1C 5.5 11/13/2019   Lab Results  Component Value Date   INSULIN 13.5 06/28/2023   Lab Results  Component Value Date   TSH 0.954 05/06/2023   Lab Results  Component  Value Date   CHOL 161 05/06/2023   HDL 38 (L) 05/06/2023   LDLCALC 101 (H) 05/06/2023   TRIG 123 05/06/2023   CHOLHDL 4.2 05/06/2023   Lab Results  Component Value Date   VD25OH 54.4 05/06/2023   VD25OH 72.8 04/13/2022   VD25OH 56 04/01/2021   Lab Results  Component Value Date   WBC 8.3 05/06/2023   HGB 16.4 05/06/2023   HCT 48.5 05/06/2023   MCV 84 05/06/2023   PLT 291 05/06/2023   No results found for: "IRON", "TIBC",  "FERRITIN"  Attestation Statements:   Reviewed by clinician on day of visit: allergies, medications, problem list, medical history, surgical history, family history, social history, and previous encounter notes.   Trude Mcburney, am acting as Energy manager for [employer], DO.  I have reviewed the above documentation for accuracy and completeness, and I agree with the above. Corinna Capra, DO

## 2023-07-22 ENCOUNTER — Ambulatory Visit: Payer: Commercial Managed Care - PPO

## 2023-07-22 ENCOUNTER — Ambulatory Visit (INDEPENDENT_AMBULATORY_CARE_PROVIDER_SITE_OTHER): Payer: Commercial Managed Care - PPO

## 2023-07-22 DIAGNOSIS — J4550 Severe persistent asthma, uncomplicated: Secondary | ICD-10-CM | POA: Diagnosis not present

## 2023-07-26 ENCOUNTER — Encounter: Payer: Self-pay | Admitting: Nurse Practitioner

## 2023-07-26 ENCOUNTER — Ambulatory Visit: Payer: Commercial Managed Care - PPO | Admitting: Nurse Practitioner

## 2023-07-26 VITALS — BP 120/78 | HR 72 | Temp 98.3°F | Ht 70.0 in | Wt 224.0 lb

## 2023-07-26 DIAGNOSIS — E538 Deficiency of other specified B group vitamins: Secondary | ICD-10-CM | POA: Diagnosis not present

## 2023-07-26 DIAGNOSIS — E669 Obesity, unspecified: Secondary | ICD-10-CM | POA: Diagnosis not present

## 2023-07-26 DIAGNOSIS — Z6832 Body mass index (BMI) 32.0-32.9, adult: Secondary | ICD-10-CM | POA: Diagnosis not present

## 2023-07-26 NOTE — Progress Notes (Signed)
Office: [phone]  /  Fax: [phone]  WEIGHT SUMMARY AND BIOMETRICS  Weight Lost Since Last Visit: 4lb  Weight Gained Since Last Visit: 0lb   Vitals Temp: 98.3 F (36.8 C) BP: 120/78 Pulse Rate: 72 SpO2: 97 %   Anthropometric Measurements Height: 5\' 10"  (1.778 m) Weight: 224 lb (101.6 kg) BMI (Calculated): 32.14 Weight at Last Visit: 228lb Weight Lost Since Last Visit: 4lb Weight Gained Since Last Visit: 0lb Starting Weight: 226lb Total Weight Loss (lbs): 2 lb (0.907 kg)   Body Composition  Body Fat %: 30.8 % Fat Mass (lbs): 69.2 lbs Muscle Mass (lbs): 147.6 lbs Total Body Water (lbs): 104.2 lbs Visceral Fat Rating : 15   Other Clinical Data Fasting: No Labs: No Today's Visit #: 3 Starting Date: 06/28/23     HPI  Chief Complaint: OBESITY  Sergio [name] is here to discuss his progress with his obesity treatment plan. He is on the the Category 3 Plan and states he is following his eating plan approximately 90 % of the time. He states he is exercising 30 minutes 3-4 days per week-walking.   Interval History:  Since last office visit he has lost 4 pounds.  Doing well with the meal plan.  Finds it's hard when he goes out to eat.  Reports some polyphagia/cravings.  Aiming to drink more water.  Occ drinks a Coke zero.     Pharmacotherapy for weight loss: He is not currently taking medications  for medical weight loss.  Previous pharmacotherapy for medical weight loss:  None  Bariatric surgery:  Patient has not had bariatric surgery   Low Vit B12 Taking Vit B12 OTC.  Has been taking for 2 weeks and reports his fatigue is improving.    PHYSICAL EXAM:  Blood pressure 120/78, pulse 72, temperature 98.3 F (36.8 C), height 5\' 10"  (1.778 m), weight 224 lb (101.6 kg), SpO2 97%. Body mass index is 32.14 kg/m.  General: He is overweight, cooperative, alert, well developed, and in no acute distress. PSYCH: Has normal mood, affect and thought process.    Extremities: No edema.  Neurologic: No gross sensory or motor deficits. No tremors or fasciculations noted.    DIAGNOSTIC DATA REVIEWED:  BMET    Component Value Date/Time   NA 140 06/28/2023 0920   K 4.5 06/28/2023 0920   CL 99 06/28/2023 0920   CO2 23 06/28/2023 0920   GLUCOSE 98 06/28/2023 0920   GLUCOSE 111 (H) 08/04/2021 0901   BUN 13 06/28/2023 0920   CREATININE 1.03 06/28/2023 0920   CREATININE 1.11 04/01/2021 1756   CALCIUM 9.4 06/28/2023 0920   GFRNONAA >60 08/04/2021 0901   GFRNONAA 94 07/11/2020 1708   GFRAA 109 07/11/2020 1708   Lab Results  Component Value Date   HGBA1C 5.6 05/06/2023   HGBA1C 5.5 11/13/2019   Lab Results  Component Value Date   INSULIN 13.5 06/28/2023   Lab Results  Component Value Date   TSH 0.954 05/06/2023   CBC    Component Value Date/Time   WBC 8.3 05/06/2023 1611   WBC 12.5 (H) 04/01/2021 1756   RBC 5.79 05/06/2023 1611   RBC 5.84 (H) 04/01/2021 1756   HGB 16.4 05/06/2023 1611   HCT 48.5 05/06/2023 1611   PLT 291 05/06/2023 1611   MCV 84 05/06/2023 1611   MCH 28.3 05/06/2023 1611   MCH 28.8 04/01/2021 1756   MCHC 33.8 05/06/2023 1611   MCHC 33.8 04/01/2021 1756   RDW 13.4 05/06/2023 1611  Iron Studies No results found for: "IRON", "TIBC", "FERRITIN", "IRONPCTSAT" Lipid Panel     Component Value Date/Time   CHOL 161 05/06/2023 1611   TRIG 123 05/06/2023 1611   HDL 38 (L) 05/06/2023 1611   CHOLHDL 4.2 05/06/2023 1611   CHOLHDL 5.2 (H) 07/11/2020 1708   VLDL 29 03/06/2013 0844   LDLCALC 101 (H) 05/06/2023 1611   LDLCALC 147 (H) 07/11/2020 1708   Hepatic Function Panel     Component Value Date/Time   PROT 7.1 06/28/2023 0920   ALBUMIN 4.5 06/28/2023 0920   AST 15 06/28/2023 0920   ALT 18 06/28/2023 0920   ALKPHOS 77 06/28/2023 0920   BILITOT 0.5 06/28/2023 0920      Component Value Date/Time   TSH 0.954 05/06/2023 1611   Nutritional Lab Results  Component Value Date   VD25OH 54.4 05/06/2023    VD25OH 72.8 04/13/2022   VD25OH 56 04/01/2021     ASSESSMENT AND PLAN  TREATMENT PLAN FOR OBESITY:  Recommended Dietary Goals  Vallon is currently in the action stage of change. As such, his goal is to continue weight management plan. He has agreed to the Category 3 Plan.  Behavioral Intervention  We discussed the following Behavioral Modification Strategies today: continue to work on maintaining a reduced calorie state, getting the recommended amount of protein, incorporating whole foods, making healthy choices, staying well hydrated and practicing mindfulness when eating..  Additional resources provided today: NA  Recommended Physical Activity Goals  Devere has been advised to work up to 150 minutes of moderate intensity aerobic activity a week and strengthening exercises 2-3 times per week for cardiovascular health, weight loss maintenance and preservation of muscle mass.   He has agreed to Continue current level of physical activity , Think about enjoyable ways to increase daily physical activity and overcoming barriers to exercise, and Increase physical activity in their day and reduce sedentary time (increase NEAT).    ASSOCIATED CONDITIONS ADDRESSED TODAY  Action/Plan  B12 deficiency Continue Vit B12 OTC  Generalized obesity  BMI 32.0-32.9,adult         Return in about 2 weeks (around 08/09/2023).Marland Kitchen He was informed of the importance of frequent follow up visits to maximize his success with intensive lifestyle modifications for his multiple health conditions.   ATTESTASTION STATEMENTS:  Reviewed by clinician on day of visit: allergies, medications, problem list, medical history, surgical history, family history, social history, and previous encounter notes.   Time spent on visit including pre-visit chart review and post-visit care and charting was 30 minutes.    Theodis Sato. Paymon Rosensteel FNP-C

## 2023-08-09 ENCOUNTER — Other Ambulatory Visit: Payer: Self-pay

## 2023-08-09 NOTE — Progress Notes (Signed)
Specialty Pharmacy Refill Coordination Note  Sergio [name] is a 47 y.o. male contacted today regarding refills of specialty medication(s) Tezepelumab-Ekko   Patient requested Courier to Provider Office   Delivery date: 08/16/23   [facility]  [address] [place] [address]   Medication will be filled on 08/13/23.

## 2023-08-11 ENCOUNTER — Ambulatory Visit: Payer: Commercial Managed Care - PPO | Admitting: Nurse Practitioner

## 2023-08-11 ENCOUNTER — Encounter: Payer: Self-pay | Admitting: Nurse Practitioner

## 2023-08-11 VITALS — BP 120/82 | HR 75 | Temp 98.1°F | Ht 70.0 in | Wt 224.0 lb

## 2023-08-11 DIAGNOSIS — I10 Essential (primary) hypertension: Secondary | ICD-10-CM | POA: Diagnosis not present

## 2023-08-11 DIAGNOSIS — E669 Obesity, unspecified: Secondary | ICD-10-CM | POA: Diagnosis not present

## 2023-08-11 DIAGNOSIS — Z6832 Body mass index (BMI) 32.0-32.9, adult: Secondary | ICD-10-CM

## 2023-08-11 NOTE — Progress Notes (Signed)
Office: [phone]  /  Fax: [phone]  WEIGHT SUMMARY AND BIOMETRICS  Weight Lost Since Last Visit: 0lb  Weight Gained Since Last Visit: 0lb   Vitals Temp: 98.1 F (36.7 C) BP: 120/82 Pulse Rate: 75 SpO2: 97 %   Anthropometric Measurements Height: 5\' 10"  (1.778 m) Weight: 224 lb (101.6 kg) BMI (Calculated): 32.14 Weight at Last Visit: 224lb Weight Lost Since Last Visit: 0lb Weight Gained Since Last Visit: 0lb Starting Weight: 226lb Total Weight Loss (lbs): 2 lb (0.907 kg)   Body Composition  Body Fat %: 29.7 % Fat Mass (lbs): 68.8 lbs Muscle Mass (lbs): 150 lbs Total Body Water (lbs): 102.2 lbs Visceral Fat Rating : 15   Other Clinical Data Fasting: No Labs: No Today's Visit #: 3 Starting Date: 06/28/23     HPI  Chief Complaint: OBESITY  Sergio [name] is here to discuss his progress with his obesity treatment plan. He is on the the Category 3 Plan and states he is following his eating plan approximately 90-95 % of the time. He states he is exercising 30-45 minutes 3-4 days per week-walking and resistance bands.   Interval History:  Since last office visit he has maintained his weight.  Doing well with the meal plan and feels like he's getting on a routine. He is averaging around 1400-1500 calories and 80-85 grams of protein.   He is snacking on apples and cheese.  He is drinking water and 1 protein shake daily.    Pharmacotherapy for weight loss: He is not currently taking medications  for medical weight loss.   Previous pharmacotherapy for medical weight loss:  None   Bariatric surgery:  Patient has not had bariatric surgery   Hypertension Hypertension well controlled.  Medication(s): Hyzaar 100-25mg . Denies side effects.  Denies chest pain, palpitations and SOB.  BP Readings from Last 3 Encounters:  08/11/23 120/82  07/26/23 120/78  07/12/23 121/82   Lab Results  Component Value Date   CREATININE 1.03 06/28/2023   CREATININE 0.96 05/06/2023    CREATININE 1.08 04/13/2022      PHYSICAL EXAM:  Blood pressure 120/82, pulse 75, temperature 98.1 F (36.7 C), height 5\' 10"  (1.778 m), weight 224 lb (101.6 kg), SpO2 97%. Body mass index is 32.14 kg/m.  General: He is overweight, cooperative, alert, well developed, and in no acute distress. PSYCH: Has normal mood, affect and thought process.   Extremities: No edema.  Neurologic: No gross sensory or motor deficits. No tremors or fasciculations noted.    DIAGNOSTIC DATA REVIEWED:  BMET    Component Value Date/Time   NA 140 06/28/2023 0920   K 4.5 06/28/2023 0920   CL 99 06/28/2023 0920   CO2 23 06/28/2023 0920   GLUCOSE 98 06/28/2023 0920   GLUCOSE 111 (H) 08/04/2021 0901   BUN 13 06/28/2023 0920   CREATININE 1.03 06/28/2023 0920   CREATININE 1.11 04/01/2021 1756   CALCIUM 9.4 06/28/2023 0920   GFRNONAA >60 08/04/2021 0901   GFRNONAA 94 07/11/2020 1708   GFRAA 109 07/11/2020 1708   Lab Results  Component Value Date   HGBA1C 5.6 05/06/2023   HGBA1C 5.5 11/13/2019   Lab Results  Component Value Date   INSULIN 13.5 06/28/2023   Lab Results  Component Value Date   TSH 0.954 05/06/2023   CBC    Component Value Date/Time   WBC 8.3 05/06/2023 1611   WBC 12.5 (H) 04/01/2021 1756   RBC 5.79 05/06/2023 1611   RBC 5.84 (H) 04/01/2021 1756  HGB 16.4 05/06/2023 1611   HCT 48.5 05/06/2023 1611   PLT 291 05/06/2023 1611   MCV 84 05/06/2023 1611   MCH 28.3 05/06/2023 1611   MCH 28.8 04/01/2021 1756   MCHC 33.8 05/06/2023 1611   MCHC 33.8 04/01/2021 1756   RDW 13.4 05/06/2023 1611   Iron Studies No results found for: "IRON", "TIBC", "FERRITIN", "IRONPCTSAT" Lipid Panel     Component Value Date/Time   CHOL 161 05/06/2023 1611   TRIG 123 05/06/2023 1611   HDL 38 (L) 05/06/2023 1611   CHOLHDL 4.2 05/06/2023 1611   CHOLHDL 5.2 (H) 07/11/2020 1708   VLDL 29 03/06/2013 0844   LDLCALC 101 (H) 05/06/2023 1611   LDLCALC 147 (H) 07/11/2020 1708   Hepatic  Function Panel     Component Value Date/Time   PROT 7.1 06/28/2023 0920   ALBUMIN 4.5 06/28/2023 0920   AST 15 06/28/2023 0920   ALT 18 06/28/2023 0920   ALKPHOS 77 06/28/2023 0920   BILITOT 0.5 06/28/2023 0920      Component Value Date/Time   TSH 0.954 05/06/2023 1611   Nutritional Lab Results  Component Value Date   VD25OH 54.4 05/06/2023   VD25OH 72.8 04/13/2022   VD25OH 56 04/01/2021     ASSESSMENT AND PLAN  TREATMENT PLAN FOR OBESITY:  Recommended Dietary Goals  Sergio [name] is currently in the action stage of change. As such, his goal is to continue weight management plan. He has agreed to keeping a food journal and adhering to recommended goals of 1400-1500 calories and 90+ protein.  Will review at next visit.    Behavioral Intervention  We discussed the following Behavioral Modification Strategies today: celebration eating strategies and continue to work on maintaining a reduced calorie state, getting the recommended amount of protein, incorporating whole foods, making healthy choices, staying well hydrated and practicing mindfulness when eating..  Additional resources provided today: NA  Recommended Physical Activity Goals  Sergio [name] has been advised to work up to 150 minutes of moderate intensity aerobic activity a week and strengthening exercises 2-3 times per week for cardiovascular health, weight loss maintenance and preservation of muscle mass.   He has agreed to Increase the intensity, frequency or duration of strengthening exercises  and Increase the intensity, frequency or duration of aerobic exercises     ASSOCIATED CONDITIONS ADDRESSED TODAY  Action/Plan  Essential hypertension Continue to follow up with PCP.  Continue meds as directed.    Generalized obesity  BMI 32.0-32.9,adult         Return in about 2 weeks (around 08/25/2023).Marland Kitchen He was informed of the importance of frequent follow up visits to maximize his success with intensive lifestyle  modifications for his multiple health conditions.   ATTESTASTION STATEMENTS:  Reviewed by clinician on day of visit: allergies, medications, problem list, medical history, surgical history, family history, social history, and previous encounter notes.   Time spent on visit including pre-visit chart review and post-visit care and charting was 30 minutes.    Theodis Sato. Rayansh Herbst FNP-C

## 2023-08-17 ENCOUNTER — Encounter: Payer: Self-pay | Admitting: Internal Medicine

## 2023-08-17 ENCOUNTER — Other Ambulatory Visit: Payer: Self-pay | Admitting: Allergy & Immunology

## 2023-08-17 ENCOUNTER — Telehealth: Payer: Commercial Managed Care - PPO | Admitting: Internal Medicine

## 2023-08-17 ENCOUNTER — Other Ambulatory Visit: Payer: Self-pay | Admitting: Internal Medicine

## 2023-08-17 ENCOUNTER — Other Ambulatory Visit (HOSPITAL_COMMUNITY): Payer: Self-pay

## 2023-08-17 DIAGNOSIS — J0110 Acute frontal sinusitis, unspecified: Secondary | ICD-10-CM

## 2023-08-17 DIAGNOSIS — K219 Gastro-esophageal reflux disease without esophagitis: Secondary | ICD-10-CM

## 2023-08-17 MED ORDER — AZELASTINE HCL 137 MCG/SPRAY NA SOLN
NASAL | 0 refills | Status: DC
  Filled 2023-08-17: qty 30, 30d supply, fill #0

## 2023-08-17 MED ORDER — PANTOPRAZOLE SODIUM 40 MG PO TBEC
40.0000 mg | DELAYED_RELEASE_TABLET | Freq: Every day | ORAL | 1 refills | Status: DC
  Filled 2023-08-17: qty 90, 90d supply, fill #0
  Filled 2023-12-13: qty 90, 90d supply, fill #1

## 2023-08-17 MED ORDER — AMOXICILLIN-POT CLAVULANATE 875-125 MG PO TABS
1.0000 | ORAL_TABLET | Freq: Two times a day (BID) | ORAL | 0 refills | Status: DC
  Filled 2023-08-17: qty 14, 7d supply, fill #0

## 2023-08-17 NOTE — Progress Notes (Signed)
Virtual Visit via Video Note   Because of Sergio [name]'s co-morbid illnesses, he is at least at moderate risk for complications without adequate follow up.  This format is felt to be most appropriate for this patient at this time.  All issues noted in this document were discussed and addressed.  A limited physical exam was performed with this format.      Evaluation Performed:  Follow-up visit  Date:  08/17/2023   ID:  Sergio [name], Sergio [name] 1976-07-15, MRN [id number]  Patient Location: Home Provider Location: Office/Clinic  Participants: Patient Location of Patient: Home Location of Provider: Telehealth Consent was obtain for visit to be over via telehealth. I verified that I am speaking with the correct person using two identifiers.  PCP:  Anabel Halon, MD   Chief Complaint: Nasal congestion and sinus pressure  History of Present Illness:    Sergio [name] is a 47 y.o. male who has a video visit for complaint of nasal congestion for the last 5 days.  He started having nasal congestion, fatigue, myalgias, sore throat and sinus pressure related headache, but fatigue and myalgias have resolved now. He has tried taking DayQuil/NyQuil with mild relief.  He is also using azelastine nasal spray.  He denies any recent worsening of dyspnea or wheezing.  He takes Singulair for allergies.  His home COVID test was negative.  The patient does not have symptoms concerning for COVID-19 infection (fever, chills, cough, or new shortness of breath).   Past Medical, Surgical, Social History, Allergies, and Medications have been Reviewed.  Past Medical History:  Diagnosis Date   Anxiety    Asthma with allergic rhinitis 03/11/2008   Constipation    Essential hypertension 05/06/2012   GAD (generalized anxiety disorder) 04/13/2022   Gastroesophageal reflux disease with hiatal hernia 05/06/2012   Gastroesophageal reflux disease with hiatal hernia    Hyperlipidemia LDL goal < 160  03/11/2008   Hypothyroidism 10/13/2021   Irritable bowel syndrome with constipation and diarrhea 05/06/2012   Joint pain    Joint pain    Migraine    Obesity (BMI 30.0-34.9) 03/03/2013   Obstructive sleep apnea 11/19/2008   Nocturnal polysomnography 12/14/2008: AHI 20.8, O2 Sat nadir 84%, optimal CPAP 12 cm H2O    Palpitations    Pre-diabetes    Sleep apnea    Swelling of lower extremity    Urticaria    Vitamin D deficiency    Past Surgical History:  Procedure Laterality Date   COLONOSCOPY WITH PROPOFOL N/A 08/06/2021   Procedure: COLONOSCOPY WITH PROPOFOL;  Surgeon: Dolores Frame, MD;  Location: [facility];  Service: Gastroenterology;  Laterality: N/A;  915   POLYPECTOMY  08/06/2021   Procedure: POLYPECTOMY;  Surgeon: Marguerita Merles, Reuel Boom, MD;  Location: [facility];  Service: Gastroenterology;;  cecum and ascending   URETHRAL DILATION       No outpatient medications have been marked as taking for the 08/17/23 encounter (Video Visit) with Anabel Halon, MD.   Current Facility-Administered Medications for the 08/17/23 encounter (Video Visit) with Anabel Halon, MD  Medication   tezepelumab-ekko (TEZSPIRE) 210 MG/1. syringe 210 mg     Allergies:   Other   ROS:   Please see the history of present illness.    All other systems reviewed and are negative.   Labs/Other Tests and Data Reviewed:    Recent Labs: 05/06/2023: Hemoglobin 16.4; Platelets 291; TSH 0.954 06/28/2023: ALT 18; BUN 13; Creatinine, Ser  1.03; Potassium 4.5; Sodium 140   Recent Lipid Panel Lab Results  Component Value Date/Time   CHOL 161 05/06/2023 04:11 PM   TRIG 123 05/06/2023 04:11 PM   HDL 38 (L) 05/06/2023 04:11 PM   CHOLHDL 4.2 05/06/2023 04:11 PM   CHOLHDL 5.2 (H) 07/11/2020 05:08 PM   LDLCALC 101 (H) 05/06/2023 04:11 PM   LDLCALC 147 (H) 07/11/2020 05:08 PM    Wt Readings from Last 3 Encounters:  08/11/23 224 lb (101.6 kg)  07/26/23 224 lb (101.6 kg)  07/12/23  228 lb (103.4 kg)     Objective:    Vital Signs:  There were no vitals taken for this visit.   VITAL SIGNS:  reviewed GEN:  no acute distress EYES:  sclerae anicteric, EOMI - Extraocular Movements Intact RESPIRATORY:  normal respiratory effort, symmetric expansion NEURO:  alert and oriented x 3, no obvious focal deficit PSYCH:  normal affect  ASSESSMENT & PLAN:    Acute non-recurrent frontal sinusitis Started empiric Augmentin Augmentin considering persistent symptoms despite symptomatic treatment Continue azelastine nasal spray for nasal congestion and allergies Use humidifier and/or vaporizer for nasal congestion Continue Singulair for allergies   I discussed the assessment and treatment plan with the patient. The patient was provided an opportunity to ask questions, and all were answered. The patient agreed with the plan and demonstrated an understanding of the instructions.   The patient was advised to call back or seek an in-person evaluation if the symptoms worsen or if the condition fails to improve as anticipated.  The above assessment and management plan was discussed with the patient. The patient verbalized understanding of and has agreed to the management plan.   Medication Adjustments/Labs and Tests Ordered: Current medicines are reviewed at length with the patient today.  Concerns regarding medicines are outlined above.   Tests Ordered: No orders of the defined types were placed in this encounter.   Medication Changes: No orders of the defined types were placed in this encounter.    Note: This dictation was prepared with Dragon dictation along with smaller phrase technology. Similar sounding words can be transcribed inadequately or may not be corrected upon review. Any transcriptional errors that result from this process are unintentional.      Disposition:  Follow up  Signed, Anabel Halon, MD  08/17/2023 1:49 PM     [facility]

## 2023-08-17 NOTE — Assessment & Plan Note (Signed)
Started empiric Augmentin Augmentin considering persistent symptoms despite symptomatic treatment Continue azelastine nasal spray for nasal congestion and allergies Use humidifier and/or vaporizer for nasal congestion Continue Singulair for allergies

## 2023-08-17 NOTE — Telephone Encounter (Signed)
scheduled

## 2023-08-19 ENCOUNTER — Other Ambulatory Visit (HOSPITAL_COMMUNITY): Payer: Self-pay

## 2023-08-19 ENCOUNTER — Ambulatory Visit: Payer: Commercial Managed Care - PPO | Admitting: *Deleted

## 2023-08-19 ENCOUNTER — Other Ambulatory Visit: Payer: Self-pay

## 2023-08-19 DIAGNOSIS — J4550 Severe persistent asthma, uncomplicated: Secondary | ICD-10-CM | POA: Diagnosis not present

## 2023-08-23 ENCOUNTER — Other Ambulatory Visit (HOSPITAL_BASED_OUTPATIENT_CLINIC_OR_DEPARTMENT_OTHER): Payer: Self-pay

## 2023-08-25 ENCOUNTER — Other Ambulatory Visit (HOSPITAL_COMMUNITY): Payer: Self-pay

## 2023-08-26 ENCOUNTER — Ambulatory Visit: Payer: Commercial Managed Care - PPO | Admitting: Nurse Practitioner

## 2023-08-31 ENCOUNTER — Other Ambulatory Visit (HOSPITAL_COMMUNITY): Payer: Self-pay

## 2023-09-07 ENCOUNTER — Other Ambulatory Visit: Payer: Self-pay

## 2023-09-07 NOTE — Progress Notes (Signed)
Specialty Pharmacy Refill Coordination Note  MAJER BANSAL is a 47 y.o. male contacted today regarding refills of specialty medication(s) Daiva Huge Dorothea Ogle)   Patient requested Courier to Provider Office   Delivery date: 09/13/23   [facility]  [address] [place] [address]   Medication will be filled on 12.27.24.

## 2023-09-10 ENCOUNTER — Other Ambulatory Visit: Payer: Self-pay

## 2023-09-16 ENCOUNTER — Ambulatory Visit: Payer: Commercial Managed Care - PPO

## 2023-09-26 ENCOUNTER — Encounter: Payer: Self-pay | Admitting: Allergy & Immunology

## 2023-09-27 ENCOUNTER — Encounter: Payer: Self-pay | Admitting: Nurse Practitioner

## 2023-09-27 ENCOUNTER — Ambulatory Visit
Admission: RE | Admit: 2023-09-27 | Discharge: 2023-09-27 | Disposition: A | Discharge status: Home / Self Care | Payer: Commercial Managed Care - PPO | Source: Ambulatory Visit | Attending: Nurse Practitioner | Admitting: Nurse Practitioner

## 2023-09-27 VITALS — BP 137/84 | HR 98 | Temp 98.4°F | Resp 16

## 2023-09-27 DIAGNOSIS — J0140 Acute pansinusitis, unspecified: Secondary | ICD-10-CM

## 2023-09-27 DIAGNOSIS — J4541 Moderate persistent asthma with (acute) exacerbation: Secondary | ICD-10-CM

## 2023-09-27 MED ORDER — DOXYCYCLINE HYCLATE 100 MG PO TABS: 100.0000 mg | ORAL_TABLET | Freq: Two times a day (BID) | ORAL | 0 refills | Status: AC

## 2023-09-27 MED ORDER — FLUTICASONE PROPIONATE 50 MCG/ACT NA SUSP: 2.0000 | Freq: Every day | NASAL | 0 refills | Status: AC

## 2023-09-27 MED ORDER — PSEUDOEPH-BROMPHEN-DM 30-2-10 MG/5ML PO SYRP: 5.0000 mL | ORAL_SOLUTION | Freq: Four times a day (QID) | ORAL | 0 refills | Status: DC | PRN

## 2023-09-27 MED ORDER — PREDNISONE 50 MG PO TABS: ORAL_TABLET | ORAL | 0 refills | Status: DC

## 2023-09-27 NOTE — Discharge Instructions (Signed)
 Take medication as directed. Continue your current allergy  medication regimen. Increase fluids and get plenty of rest. May take over-the-counter Ibuprofen or Tylenol as needed for pain, fever, or general discomfort. Recommend normal saline nasal spray to help with nasal congestion throughout the day. For your cough, it may be helpful to use a humidifier at bedtime and to sleep elevated while symptoms persist.  As discussed, if you complete these medications, but continue to have a nagging cough and generally feel well, drink plenty of fluids and use cough drops. If you continue to have the cough with new symptoms of fever, wheezing, SOB or difficulty breathing, follow-up with your PCP.  If symptoms fail to improve with this treatment, follow-up with your PCP for further evaluation.  Follow-up as needed.

## 2023-09-27 NOTE — ED Provider Notes (Signed)
 [facility]    CSN: [id number] Arrival date & time: 09/27/23  1750      History   Chief Complaint Chief Complaint  Patient presents with   Cough    I have been coughing, runny nose, headache, and weakness for several weeks. - Entered by patient    HPI Sergio [name] is a 48 y.o. male.   The history is provided by the patient and the spouse.   Patient presents for continued upper respiratory symptoms and cough that has been present for more than 1 month.  Patient states he has had continuous coughing, sneezing, headache, fatigue, and sinus pressure for the past 4 weeks.  He states when symptoms started initially, his PCP prescribed Augmentin .  States he initially felt better, then symptoms returned approximately 2 weeks later, but worsened.  Patient reports that he does have underlying history of asthma, states he has been using his inhaler approximately every hour.  He denies new fever, chills, ear pain, sore throat, wheezing, difficulty breathing, chest pain, abdominal pain, nausea, vomiting, diarrhea, or rash.  Patient reports he has been taking over-the-counter cough and cold medications with minimal relief of his symptoms.  Past Medical History:  Diagnosis Date   Anxiety    Asthma with allergic rhinitis 03/11/2008   Constipation    Essential hypertension 05/06/2012   GAD (generalized anxiety disorder) 04/13/2022   Gastroesophageal reflux disease with hiatal hernia 05/06/2012   Gastroesophageal reflux disease with hiatal hernia    Hyperlipidemia LDL goal < 160 03/11/2008   Hypothyroidism 10/13/2021   Irritable bowel syndrome with constipation and diarrhea 05/06/2012   Joint pain    Joint pain    Migraine    Obesity (BMI 30.0-34.9) 03/03/2013   Obstructive sleep apnea 11/19/2008   Nocturnal polysomnography 12/14/2008: AHI 20.8, O2 Sat nadir 84%, optimal CPAP 12 cm H2O    Palpitations    Pre-diabetes    Sleep apnea    Swelling of lower extremity    Urticaria     Vitamin D  deficiency     Patient Active Problem List   Diagnosis Date Noted   Acute non-recurrent frontal sinusitis 08/17/2023   Class 1 obesity due to excess calories with serious comorbidity and body mass index (BMI) of 32.0 to 32.9 in adult 07/12/2023   Insulin  resistance 07/12/2023   Low HDL (under 40) 07/12/2023   Elevated glucose 06/28/2023   Health care maintenance 06/28/2023   B12 deficiency 06/28/2023   Vitamin D  deficiency 06/28/2023   Gastroesophageal reflux disease 06/28/2023   SOB (shortness of breath) on exertion 06/28/2023   Other fatigue 06/28/2023   Cold sore 05/06/2023   Tinea corporis 09/01/2022   Atypical nevi 09/01/2022   Encounter for general adult medical examination with abnormal findings 04/17/2022   GAD (generalized anxiety disorder) 04/13/2022   Hypothyroidism 10/13/2021   Plantar fasciitis of right foot 12/09/2020   Anaphylactic shock due to adverse food reaction 05/23/2019   Moderate persistent asthma, uncomplicated 05/23/2019   Seasonal and perennial allergic rhinitis 05/23/2019   Migraine 10/19/2018   Pain of left thumb 04/12/2015   Class 1 obesity due to excess calories with serious comorbidity and body mass index (BMI) of 31.0 to 31.9 in adult 03/03/2013   Gastroesophageal reflux disease with hiatal hernia 05/06/2012   Essential hypertension 05/06/2012   Irritable bowel syndrome with constipation and diarrhea 05/06/2012   Obstructive sleep apnea 11/19/2008   Hyperlipidemia 03/11/2008   Asthma with allergic rhinitis 03/11/2008    Past Surgical  History:  Procedure Laterality Date   COLONOSCOPY WITH PROPOFOL  N/A 08/06/2021   Procedure: COLONOSCOPY WITH PROPOFOL ;  Surgeon: Eartha Angelia Sieving, MD;  Location: [facility];  Service: Gastroenterology;  Laterality: N/A;  915   POLYPECTOMY  08/06/2021   Procedure: POLYPECTOMY;  Surgeon: Eartha Angelia Sieving, MD;  Location: [facility];  Service: Gastroenterology;;  cecum and  ascending   URETHRAL DILATION         Home Medications    Prior to Admission medications   Medication Sig Start Date End Date Taking? Authorizing Provider  brompheniramine-pseudoephedrine-DM 30-2-10 MG/5ML syrup Take 5 mLs by mouth 4 (four) times daily as needed. 09/27/23  Yes Leath-Warren, Etta PARAS, NP  doxycycline  (VIBRA -TABS) 100 MG tablet Take 1 tablet (100 mg total) by mouth 2 (two) times daily for 7 days. 09/27/23 10/04/23 Yes Leath-Warren, Etta PARAS, NP  fluticasone  (FLONASE ) 50 MCG/ACT nasal spray Place 2 sprays into both nostrils daily. 09/27/23  Yes Leath-Warren, Etta PARAS, NP  amoxicillin -clavulanate (AUGMENTIN ) 875-125 MG tablet Take 1 tablet by mouth 2 (two) times daily. 08/17/23   Tobie Suzzane POUR, MD  Azelastine  HCl 137 MCG/SPRAY SOLN INSTILL 2 SPRAYS INTO BOTH NOSTRILS TWICE DAILY--must have office visit for further refills 08/17/23 08/16/24  Iva Marty Saltness, MD  Budeson-Glycopyrrol-Formoterol  (BREZTRI  AEROSPHERE) 160-9-4.8 MCG/ACT AERO Inhale 2 puffs into the lungs in the morning and at bedtime. 04/27/23   Iva Marty Saltness, MD  busPIRone  (BUSPAR ) 5 MG tablet Take 1 tablet by mouth 2 times daily. 04/05/23   Tobie Suzzane POUR, MD  cetirizine  (ZYRTEC ) 10 MG tablet Take 1 tablet (10 mg total) by mouth daily. 04/27/23 11/23/23  Iva Marty Saltness, MD  Cholecalciferol (VITAMIN D3) 125 MCG (5000 UT) TABS Take 5,000 Units by mouth daily.    [provider]  diphenhydrAMINE (BENADRYL) 25 MG tablet Take 50 mg by mouth daily as needed for itching.    [provider]  EPINEPHrine  0.3 mg/0.3 mL IJ SOAJ injection Inject 0.3 mg into the muscle as needed for anaphylaxis. 10/07/21   Iva Marty Saltness, MD  hyoscyamine  (LEVSIN ) 0.125 MG tablet Take 1 tablet (0.125 mg total) by mouth every 6 (six) hours as needed for abdominal pain. 08/21/21   Castaneda Mayorga, Daniel, MD  levalbuterol  (XOPENEX  HFA) 45 MCG/ACT inhaler Inhale 4 puffs into the lungs every 4 to 6 hours as  needed. 12/24/22   Jeneal Danita Macintosh, MD  losartan -hydrochlorothiazide  (HYZAAR ) 100-25 MG tablet Take 1 tablet by mouth daily. 04/05/23   Tobie Suzzane POUR, MD  montelukast  (SINGULAIR ) 10 MG tablet Take 1 tablet (10 mg total) by mouth daily. 04/27/23   Iva Marty Saltness, MD  pantoprazole  (PROTONIX ) 40 MG tablet Take 1 tablet (40 mg total) by mouth daily. 08/17/23   Tobie Suzzane POUR, MD  predniSONE  (DELTASONE ) 50 MG tablet Take 1 tablet by mouth daily with breakfast for the next 5 days. 09/27/23  Yes Leath-Warren, Etta PARAS, NP  scopolamine  (TRANSDERM-SCOP) 1 MG/3DAYS Place 1 patch (1.5 mg total) onto the skin every 3 (three) days. 03/16/22   Tobie Suzzane POUR, MD  simvastatin  (ZOCOR ) 40 MG tablet Take 1 tablet (40 mg total) by mouth at bedtime. 04/05/23   Tobie Suzzane POUR, MD  tezepelumab -ekko (TEZSPIRE ) 210 MG/1. syringe Inject 1.91 mLs (210 mg total) into the skin every 28 (twenty-eight) days. 04/28/23   Jegede, Olugbemiga E, MD  thyroid  (NP THYROID ) 90 MG tablet Take 1 tablet (90 mg total) by mouth daily. 04/05/23   Tobie Suzzane POUR, MD  zonisamide  (ZONEGRAN ) 100 MG capsule Take 1 capsule (100 mg total) by mouth daily. 04/01/22       Family History Family History  Problem Relation Age of Onset   Healthy Mother    Anxiety disorder Mother    Early death Father        Accident   Healthy Sister    Healthy Sister    Hypertension Maternal Grandmother    Vaginal cancer Maternal Grandmother    Parkinson's disease Maternal Grandmother    COPD Maternal Grandfather    Breast cancer Paternal Grandmother    Kidney cancer Paternal Grandmother    Diabetes Paternal Grandfather    ALS Paternal Grandfather    Hypertension Maternal Uncle    Diabetes Paternal Aunt    Urticaria Neg Hx    Immunodeficiency Neg Hx    Eczema Neg Hx    Atopy Neg Hx    Angioedema Neg Hx    Allergic rhinitis Neg Hx    Asthma Neg Hx     Social History Social History   Tobacco Use   Smoking status: Never   Smokeless  tobacco: Never  Vaping Use   Vaping status: Never Used  Substance Use Topics   Alcohol use: Yes    Comment: occ   Drug use: No     Allergies   Other   Review of Systems Review of Systems Per HPI  Physical Exam Triage Vital Signs ED Triage Vitals  Encounter Vitals Group     BP 09/27/23 1805 137/84     Systolic BP Percentile --      Diastolic BP Percentile --      Pulse Rate 09/27/23 1805 98     Resp 09/27/23 1805 16     Temp 09/27/23 1805 98.4 F (36.9 C)     Temp Source 09/27/23 1805 Oral     SpO2 09/27/23 1805 95 %     Weight --      Height --      Head Circumference --      Peak Flow --      Pain Score 09/27/23 1808 0     Pain Loc --      Pain Education --      Exclude from Growth Chart --    No data found.  Updated Vital Signs BP 137/84 (BP Location: Right Arm)   Pulse 98   Temp 98.4 F (36.9 C) (Oral)   Resp 16   SpO2 95%   Visual Acuity Right Eye Distance:   Left Eye Distance:   Bilateral Distance:    Right Eye Near:   Left Eye Near:    Bilateral Near:     Physical Exam Vitals and nursing note reviewed.  Constitutional:      General: He is not in acute distress.    Appearance: Normal appearance.  HENT:     Head: Normocephalic.     Right Ear: Tympanic membrane, ear canal and external ear normal.     Left Ear: Tympanic membrane, ear canal and external ear normal.     Nose: Congestion present.     Right Turbinates: Enlarged and swollen.     Left Turbinates: Enlarged and swollen.     Right Sinus: Maxillary sinus tenderness and frontal sinus tenderness present.     Left Sinus: Maxillary sinus tenderness and frontal sinus tenderness present.     Mouth/Throat:     Lips: Pink.     Mouth: Mucous membranes are moist.  Pharynx: Uvula midline. Postnasal drip present. No pharyngeal swelling, oropharyngeal exudate, posterior oropharyngeal erythema or uvula swelling.  Eyes:     Extraocular Movements: Extraocular movements intact.     Pupils:  Pupils are equal, round, and reactive to light.  Cardiovascular:     Rate and Rhythm: Normal rate and regular rhythm.     Pulses: Normal pulses.     Heart sounds: Normal heart sounds.  Pulmonary:     Effort: Pulmonary effort is normal. No respiratory distress.     Breath sounds: Normal breath sounds. No stridor. No wheezing, rhonchi or rales.  Abdominal:     General: Bowel sounds are normal.     Palpations: Abdomen is soft.     Tenderness: There is no abdominal tenderness.  Musculoskeletal:     Cervical back: Normal range of motion.  Lymphadenopathy:     Cervical: No cervical adenopathy.  Skin:    General: Skin is warm and dry.  Neurological:     General: No focal deficit present.     Mental Status: He is alert and oriented to person, place, and time.  Psychiatric:        Mood and Affect: Mood normal.        Behavior: Behavior normal.      UC Treatments / Results  Labs (all labs ordered are listed, but only abnormal results are displayed) Labs Reviewed - No data to display  EKG   Radiology No results found.  Procedures Procedures (including critical care time)  Medications Ordered in UC Medications - No data to display  Initial Impression / Assessment and Plan / UC Course  I have reviewed the triage vital signs and the nursing notes.  Pertinent labs & imaging results that were available during my care of the patient were reviewed by me and considered in my medical decision making (see chart for details).  On exam, lung sounds are clear throughout, room air sats at 95%.  Patient with ongoing headache, nasal congestion, and sinus pressure.  He also has a persistent cough.  Will treat patient for acute pansinusitis with doxycycline  100 mg twice daily for the next 7 days.  Do suspect patient has underlying asthma exacerbation given his symptoms, prednisone  50 mg prescribed.  For nasal congestion, fluticasone  50 mcg nasal spray was prescribed, and Bromfed-DM for nasal  congestion and cough.  Supportive care recommendations were provided and discussed with the patient to include over-the-counter analgesics, fluids, rest, normal saline nasal spray, and use of a humidifier at nighttime during sleep.  Discussed indications with the patient regarding follow-up.  Patient was in agreement with this plan of care and verbalized understanding.  All questions were answered.  Patient stable for discharge.   Final Clinical Impressions(s) / UC Diagnoses   Final diagnoses:  Acute pansinusitis, recurrence not specified  Moderate persistent asthma with acute exacerbation     Discharge Instructions      Take medication as directed. Continue your current allergy  medication regimen. Increase fluids and get plenty of rest. May take over-the-counter Ibuprofen or Tylenol as needed for pain, fever, or general discomfort. Recommend normal saline nasal spray to help with nasal congestion throughout the day. For your cough, it may be helpful to use a humidifier at bedtime and to sleep elevated while symptoms persist.  As discussed, if you complete these medications, but continue to have a nagging cough and generally feel well, drink plenty of fluids and use cough drops. If you continue to have the cough with  new symptoms of fever, wheezing, SOB or difficulty breathing, follow-up with your PCP.  If symptoms fail to improve with this treatment, follow-up with your PCP for further evaluation.  Follow-up as needed.      ED Prescriptions     Medication Sig Dispense Auth. Provider   predniSONE  (DELTASONE ) 50 MG tablet Take 1 tablet by mouth daily with breakfast for the next 5 days. 5 tablet Leath-Warren, Etta PARAS, NP   doxycycline  (VIBRA -TABS) 100 MG tablet Take 1 tablet (100 mg total) by mouth 2 (two) times daily for 7 days. 14 tablet Leath-Warren, Etta PARAS, NP   brompheniramine-pseudoephedrine-DM 30-2-10 MG/5ML syrup Take 5 mLs by mouth 4 (four) times daily as needed. 140 mL  Leath-Warren, Etta PARAS, NP   fluticasone  (FLONASE ) 50 MCG/ACT nasal spray Place 2 sprays into both nostrils daily. 16 g Leath-Warren, Etta PARAS, NP      PDMP not reviewed this encounter.   Gilmer Etta PARAS, NP 09/27/23 [phone]

## 2023-09-27 NOTE — ED Triage Notes (Signed)
 Pt reports cough, nasal congestion, fatigue,  headache, sinus pressure, x 4 weeks states he was treated once for sx's with an antibiotic before thanksgiving,  felt better for about 2 weeks and then sx's returned worse then before having to use his inhaler x 1 every hour

## 2023-09-28 ENCOUNTER — Ambulatory Visit: Payer: Commercial Managed Care - PPO | Admitting: Nurse Practitioner

## 2023-10-04 ENCOUNTER — Other Ambulatory Visit: Payer: Self-pay

## 2023-10-08 ENCOUNTER — Other Ambulatory Visit: Payer: Self-pay

## 2023-10-11 ENCOUNTER — Other Ambulatory Visit (HOSPITAL_COMMUNITY): Payer: Self-pay

## 2023-10-13 ENCOUNTER — Encounter: Payer: Self-pay | Admitting: Nurse Practitioner

## 2023-10-13 ENCOUNTER — Ambulatory Visit: Payer: Commercial Managed Care - PPO | Admitting: Nurse Practitioner

## 2023-10-13 VITALS — BP 121/84 | HR 89 | Temp 97.9°F | Ht 70.0 in | Wt 222.0 lb

## 2023-10-13 DIAGNOSIS — E669 Obesity, unspecified: Secondary | ICD-10-CM

## 2023-10-13 DIAGNOSIS — Z6831 Body mass index (BMI) 31.0-31.9, adult: Secondary | ICD-10-CM | POA: Diagnosis not present

## 2023-10-13 DIAGNOSIS — E7849 Other hyperlipidemia: Secondary | ICD-10-CM | POA: Diagnosis not present

## 2023-10-13 NOTE — Progress Notes (Signed)
Office: [phone]  /  Fax: [phone]  WEIGHT SUMMARY AND BIOMETRICS  Weight Lost Since Last Visit: 2lb  Weight Gained Since Last Visit: 0lb   Vitals Temp: 97.9 F (36.6 C) BP: 121/84 Pulse Rate: 89 SpO2: 95 %   Anthropometric Measurements Height: 5\' 10"  (1.778 m) Weight: 222 lb (100.7 kg) BMI (Calculated): 31.85 Weight at Last Visit: 224lb Weight Lost Since Last Visit: 2lb Weight Gained Since Last Visit: 0lb Starting Weight: 226lb Total Weight Loss (lbs): 4 lb (1.814 kg)   Body Composition  Body Fat %: 30.1 % Fat Mass (lbs): 67 lbs Muscle Mass (lbs): 148.2 lbs Total Body Water (lbs): 100 lbs Visceral Fat Rating : 15   Other Clinical Data Fasting: Yes Labs: No Today's Visit #: 4 Starting Date: 06/28/23     HPI  Chief Complaint: OBESITY  Sergio [name] is here to discuss his progress with his obesity treatment plan. He is on the the Category 3 Plan and states he is following his eating plan approximately 50 % of the time. He states he is exercising 0 minutes 0 days per week.   Interval History:  Since last office visit he has lost 2 pounds.  He has been sick since Thanksgiving and has done 2 rounds of antibiotics and 2 rounds of steroids.  He hasn't been able to follow the meal plan as he would like and is surprised he lost weight.  He is drinking water but hasn't been drinking enough.  He is also drinking coconut water, coffee and zero sugar soda.  He hasn't been able to exercise as he would like but is starting to exercise as his energy levels increase.    Pharmacotherapy for weight loss: He is not currently taking medications  for medical weight loss.   Previous pharmacotherapy for medical weight loss:  None   Bariatric surgery:  Patient has not had bariatric surgery     Hyperlipidemia Medication(s): Zocor 40mg . Denies side effects.   Cardiovascular risk factors: dyslipidemia, male gender, and obesity (BMI >= 30 kg/m2)  Lab Results  Component Value  Date   CHOL 161 05/06/2023   HDL 38 (L) 05/06/2023   LDLCALC 101 (H) 05/06/2023   TRIG 123 05/06/2023   CHOLHDL 4.2 05/06/2023   Lab Results  Component Value Date   ALT 18 06/28/2023   AST 15 06/28/2023   ALKPHOS 77 06/28/2023   BILITOT 0.5 06/28/2023   The 10-year ASCVD risk score (Arnett DK, et al., 2019) is: 3.3%   Values used to calculate the score:     Age: 48 years     Sex: Male     Is Non-Hispanic African American: No     Diabetic: No     Tobacco smoker: No     Systolic Blood Pressure: 121 mmHg     Is BP treated: Yes     HDL Cholesterol: 38 mg/dL     Total Cholesterol: 161 mg/dL   PHYSICAL EXAM:  Blood pressure 121/84, pulse 89, temperature 97.9 F (36.6 C), height 5\' 10"  (1.778 m), weight 222 lb (100.7 kg), SpO2 95%. Body mass index is 31.85 kg/m.  General: He is overweight, cooperative, alert, well developed, and in no acute distress. PSYCH: Has normal mood, affect and thought process.   Extremities: No edema.  Neurologic: No gross sensory or motor deficits. No tremors or fasciculations noted.    DIAGNOSTIC DATA REVIEWED:  BMET    Component Value Date/Time   NA 140 06/28/2023 0920   K 4.5  06/28/2023 0920   CL 99 06/28/2023 0920   CO2 23 06/28/2023 0920   GLUCOSE 98 06/28/2023 0920   GLUCOSE 111 (H) 08/04/2021 0901   BUN 13 06/28/2023 0920   CREATININE 1.03 06/28/2023 0920   CREATININE 1.11 04/01/2021 1756   CALCIUM 9.4 06/28/2023 0920   GFRNONAA >60 08/04/2021 0901   GFRNONAA 94 07/11/2020 1708   GFRAA 109 07/11/2020 1708   Lab Results  Component Value Date   HGBA1C 5.6 05/06/2023   HGBA1C 5.5 11/13/2019   Lab Results  Component Value Date   INSULIN 13.5 06/28/2023   Lab Results  Component Value Date   TSH 0.954 05/06/2023   CBC    Component Value Date/Time   WBC 8.3 05/06/2023 1611   WBC 12.5 (H) 04/01/2021 1756   RBC 5.79 05/06/2023 1611   RBC 5.84 (H) 04/01/2021 1756   HGB 16.4 05/06/2023 1611   HCT 48.5 05/06/2023 1611   PLT  291 05/06/2023 1611   MCV 84 05/06/2023 1611   MCH 28.3 05/06/2023 1611   MCH 28.8 04/01/2021 1756   MCHC 33.8 05/06/2023 1611   MCHC 33.8 04/01/2021 1756   RDW 13.4 05/06/2023 1611   Iron Studies No results found for: "IRON", "TIBC", "FERRITIN", "IRONPCTSAT" Lipid Panel     Component Value Date/Time   CHOL 161 05/06/2023 1611   TRIG 123 05/06/2023 1611   HDL 38 (L) 05/06/2023 1611   CHOLHDL 4.2 05/06/2023 1611   CHOLHDL 5.2 (H) 07/11/2020 1708   VLDL 29 03/06/2013 0844   LDLCALC 101 (H) 05/06/2023 1611   LDLCALC 147 (H) 07/11/2020 1708   Hepatic Function Panel     Component Value Date/Time   PROT 7.1 06/28/2023 0920   ALBUMIN 4.5 06/28/2023 0920   AST 15 06/28/2023 0920   ALT 18 06/28/2023 0920   ALKPHOS 77 06/28/2023 0920   BILITOT 0.5 06/28/2023 0920      Component Value Date/Time   TSH 0.954 05/06/2023 1611   Nutritional Lab Results  Component Value Date   VD25OH 54.4 05/06/2023   VD25OH 72.8 04/13/2022   VD25OH 56 04/01/2021     ASSESSMENT AND PLAN  TREATMENT PLAN FOR OBESITY:  Recommended Dietary Goals  Sergio [name] is currently in the action stage of change. As such, his goal is to continue weight management plan. He has agreed to the Category 3 Plan.  Behavioral Intervention  We discussed the following Behavioral Modification Strategies today: increasing lean protein intake to established goals, decreasing simple carbohydrates , increasing vegetables, increasing fiber rich foods, work on meal planning and preparation, reading food labels , keeping healthy foods at home, continue to work on implementation of reduced calorie nutritional plan, continue to practice mindfulness when eating, planning for success, and continue to work on maintaining a reduced calorie state, getting the recommended amount of protein, incorporating whole foods, making healthy choices, staying well hydrated and practicing mindfulness when eating..  Additional resources provided today:  NA  Recommended Physical Activity Goals  Sergio [name] has been advised to work up to 150 minutes of moderate intensity aerobic activity a week and strengthening exercises 2-3 times per week for cardiovascular health, weight loss maintenance and preservation of muscle mass.   He has agreed to Think about enjoyable ways to increase daily physical activity and overcoming barriers to exercise, Increase physical activity in their day and reduce sedentary time (increase NEAT)., and Work on scheduling and tracking physical activity.    ASSOCIATED CONDITIONS ADDRESSED TODAY  Action/Plan  Other hyperlipidemia Continue  follow-up with PCP.  Continue medications as directed.  Sergio [name] will continue to work on diet, exercise and weight loss efforts. Orders and follow up as documented in patient record.   Counseling Intensive lifestyle modifications are the first line treatment for this issue. Dietary changes: Increase soluble fiber. Decrease simple carbohydrates. Exercise changes: Moderate to vigorous-intensity aerobic activity 150 minutes per week if tolerated. Lipid-lowering medications: see documented in medical record.   Generalized obesity  BMI 31.0-31.9,adult         Return in about 2 weeks (around 10/27/2023).Marland Kitchen He was informed of the importance of frequent follow up visits to maximize his success with intensive lifestyle modifications for his multiple health conditions.   ATTESTASTION STATEMENTS:  Reviewed by clinician on day of visit: allergies, medications, problem list, medical history, surgical history, family history, social history, and previous encounter notes.   Time spent on visit including pre-visit chart review and post-visit care and charting was 30 minutes.    Theodis Sato. Sergio Kneale FNP-C

## 2023-10-14 ENCOUNTER — Encounter: Payer: Self-pay | Admitting: Allergy & Immunology

## 2023-10-22 ENCOUNTER — Other Ambulatory Visit: Payer: Self-pay

## 2023-10-27 ENCOUNTER — Ambulatory Visit: Payer: Commercial Managed Care - PPO | Admitting: Nurse Practitioner

## 2023-10-28 ENCOUNTER — Other Ambulatory Visit (HOSPITAL_COMMUNITY): Payer: Self-pay

## 2023-10-28 ENCOUNTER — Other Ambulatory Visit: Payer: Self-pay

## 2023-10-28 ENCOUNTER — Encounter: Payer: Self-pay | Admitting: Allergy & Immunology

## 2023-10-28 ENCOUNTER — Ambulatory Visit: Payer: Commercial Managed Care - PPO | Admitting: Allergy & Immunology

## 2023-10-28 VITALS — BP 126/82 | HR 98 | Temp 98.2°F | Resp 16

## 2023-10-28 DIAGNOSIS — H43391 Other vitreous opacities, right eye: Secondary | ICD-10-CM | POA: Diagnosis not present

## 2023-10-28 DIAGNOSIS — T7800XD Anaphylactic reaction due to unspecified food, subsequent encounter: Secondary | ICD-10-CM

## 2023-10-28 DIAGNOSIS — J4550 Severe persistent asthma, uncomplicated: Secondary | ICD-10-CM | POA: Diagnosis not present

## 2023-10-28 DIAGNOSIS — H43393 Other vitreous opacities, bilateral: Secondary | ICD-10-CM | POA: Diagnosis not present

## 2023-10-28 DIAGNOSIS — J4551 Severe persistent asthma with (acute) exacerbation: Secondary | ICD-10-CM

## 2023-10-28 DIAGNOSIS — J302 Other seasonal allergic rhinitis: Secondary | ICD-10-CM

## 2023-10-28 DIAGNOSIS — J3089 Other allergic rhinitis: Secondary | ICD-10-CM

## 2023-10-28 MED ORDER — PREDNISONE 10 MG PO TABS
10.0000 mg | ORAL_TABLET | Freq: Every day | ORAL | 0 refills | Status: DC
  Filled 2023-10-28: qty 36, 36d supply, fill #0

## 2023-10-28 MED ORDER — FAMOTIDINE 40 MG PO TABS
40.0000 mg | ORAL_TABLET | Freq: Every day | ORAL | 3 refills | Status: DC
  Filled 2023-10-28: qty 30, 30d supply, fill #0
  Filled 2023-12-13: qty 30, 30d supply, fill #1
  Filled 2024-01-18: qty 30, 30d supply, fill #2
  Filled 2024-03-14: qty 30, 30d supply, fill #3

## 2023-10-28 NOTE — Patient Instructions (Addendum)
Moderate persistent asthma - Lung testing not done since you were sick.  - We will continue with the Tezspire.  - We will get that back on track.  - Start prolonged prednisone taper. - ADD ON Pepcid and continue for ONE MONTH (in combination with the Protonix)  - Daily controller medication(s): Singulair 10mg  daily and Breztri two puffs twice daily with spacer and Tezspire every month - Monthly controller medication: Tezspire injection monthly.  Given dose today and will do reapproval - Prior to physical activity: Xopenex 2 puffs 10-15 minutes before physical activity. - Rescue medications: Xopenex 4 puffs every 4-6 hours as needed - Asthma control goals:  * Full participation in all desired activities (may need albuterol before activity) * Albuterol use two time or less a week on average (not counting use with activity) * Cough interfering with sleep two time or less a month * Oral steroids no more than once a year * No hospitalizations  Chronic rhinitis (grasses, ragweed, weeds, trees, indoor molds, outdoor molds and cat) - Continue with: Singulair (montelukast) 10mg  daily and Astelin (azelastine) two sprays per nostril daily and Allegra (fexofenadine) 180mg  tablet - Try to be more consistent with coming at a routine time.    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   Visual changes - Definitely mention seeing spots when you call to make your optometry appointment.  - This will definitely get you in faster.   Return in about 6 months (around 04/26/2024). You can have the follow up appointment with Dr. Dellis Anes or a Nurse Practicioner (our Nurse Practitioners are excellent and always have Physician oversight!).    Please inform us of any Emergency Department visits, hospitalizations, or changes in symptoms. Call us before going to the ED for breathing or allergy symptoms since we might be able to fit you in for a sick visit. Feel free to contact us  anytime with any questions, problems, or concerns.  It was a pleasure to see you again today!  Websites that have reliable patient information: 1. American Academy of Asthma, Allergy, and Immunology: www.aaaai.org 2. Food Allergy Research and Education (FARE): foodallergy.org 3. Mothers of Asthmatics: http://www.asthmacommunitynetwork.org 4. American College of Allergy, Asthma, and Immunology: www.acaai.org      "Like" [place] on Facebook and Instagram for our latest updates!      A healthy democracy works best when [employer] participate! Make sure you are registered to vote! If you have moved or changed any of your contact information, you will need to get this updated before voting! Scan the QR codes below to learn more!

## 2023-10-28 NOTE — Progress Notes (Signed)
FOLLOW UP  Date of Service/Encounter:  10/28/23   Assessment:   Moderate persistent asthma with acute exacerbation - starting prednisone burst   Seasonal and perennial allergic rhinitis (grasses, ragweed, weeds, trees, indoor molds, outdoor molds and cat) - restarting allergen immunotherapy   Anaphylactic shock due to food (tree nuts) - with negative skin and blood testing (successfully introduced tree nuts back into the diet)  Seeing spots in the latest few months of coughing (sees someone at [facility])   Plan/Recommendations:   Moderate persistent asthma - Lung testing not done since you were sick.  - We will continue with the Tezspire.  - We will get that back on track.  - Start prolonged prednisone taper. - ADD ON Pepcid and continue for ONE MONTH (in combination with the Protonix)  - Daily controller medication(s): Singulair 10mg  daily and Breztri two puffs twice daily with spacer and Tezspire every month - Monthly controller medication: Tezspire injection monthly.  Given dose today and will do reapproval - Prior to physical activity: Xopenex 2 puffs 10-15 minutes before physical activity. - Rescue medications: Xopenex 4 puffs every 4-6 hours as needed - Asthma control goals:  * Full participation in all desired activities (may need albuterol before activity) * Albuterol use two time or less a week on average (not counting use with activity) * Cough interfering with sleep two time or less a month * Oral steroids no more than once a year * No hospitalizations  Chronic rhinitis (grasses, ragweed, weeds, trees, indoor molds, outdoor molds and cat) - Continue with: Singulair (montelukast) 10mg  daily and Astelin (azelastine) two sprays per nostril daily and Allegra (fexofenadine) 180mg  tablet - Try to be more consistent with coming at a routine time.    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   Visual changes - Definitely mention  seeing spots when you call to make your optometry appointment.  - This will definitely get you in faster.   Return in about 6 months (around 04/26/2024). You can have the follow up appointment with Dr. Dellis Anes or a Nurse Practicioner (our Nurse Practitioners are excellent and always have Physician oversight!).    Subjective:   Sergio [name] is a 48 y.o. male presenting today for follow up of  Chief Complaint  Patient presents with   Cough   Wheezing   Asthma    Sergio [name] has a history of the following: Patient Active Problem List   Diagnosis Date Noted   Acute non-recurrent frontal sinusitis 08/17/2023   Class 1 obesity due to excess calories with serious comorbidity and body mass index (BMI) of 32.0 to 32.9 in adult 07/12/2023   Insulin resistance 07/12/2023   Low HDL (under 40) 07/12/2023   Elevated glucose 06/28/2023   Health care maintenance 06/28/2023   B12 deficiency 06/28/2023   Vitamin D deficiency 06/28/2023   Gastroesophageal reflux disease 06/28/2023   SOB (shortness of breath) on exertion 06/28/2023   Other fatigue 06/28/2023   Cold sore 05/06/2023   Tinea corporis 09/01/2022   Atypical nevi 09/01/2022   Encounter for general adult medical examination with abnormal findings 04/17/2022   GAD (generalized anxiety disorder) 04/13/2022   Hypothyroidism 10/13/2021   Plantar fasciitis of right foot 12/09/2020   Anaphylactic shock due to adverse food reaction 05/23/2019   Moderate persistent asthma, uncomplicated 05/23/2019   Seasonal and perennial allergic rhinitis 05/23/2019   Migraine 10/19/2018   Pain of left thumb 04/12/2015  Class 1 obesity due to excess calories with serious comorbidity and body mass index (BMI) of 31.0 to 31.9 in adult 03/03/2013   Gastroesophageal reflux disease with hiatal hernia 05/06/2012   Essential hypertension 05/06/2012   Irritable bowel syndrome with constipation and diarrhea 05/06/2012   Obstructive sleep apnea 11/19/2008    Hyperlipidemia 03/11/2008   Asthma with allergic rhinitis 03/11/2008    History obtained from: chart review and patient.  Discussed the use of AI scribe software for clinical note transcription with the patient and/or guardian, who gave verbal consent to proceed.  Sergio [name] is a 48 y.o. male presenting for a sick visit.  We last saw him in August 2024.  At that time, we gave him a Tezspire sample to get him back on board.  We continue with Singulair as well as Breztri.  For his allergies, we continue with montelukast as well as Astelin, fexofenadine, and Astelin.  Since the last visit, he has done well. He presents today with persistent cough and drainage.    Asthma/Respiratory Symptom History: He has been experiencing persistent respiratory symptoms since Thanksgiving. Initially, he was treated with an antibiotic and a steroid by his primary care doctor, which provided relief for about two weeks. However, on Christmas day, his symptoms returned, prompting a visit to urgent care on January 13th, where he was prescribed a stronger antibiotic and steroid. He feels better but continues to experience drainage and a bad cough, particularly after eating. He was diagnosed with a severe sinus infection at urgent care, where he was prescribed doxycycline, Flonase, Sudafed, and a cough medicine. The cough medicine provided some relief but was not completely effective.  Allergic Rhinitis Symptom History: He has been inconsistent with his allergy shots due to his work schedule and illness, but plans to coordinate a better schedule for receiving them.  GERD Symptom History: He is currently taking Protonix for reflux but has not been using Pepcid. He has been on prednisone for about five days cumulatively over the past couple of months.   He reports seeing 'little spots' in his vision, which he noticed on Sunday, and has scheduled an appointment with [facility] in two weeks. No history of retinal  detachment.  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 He has access to medical staff on the fourth floor of his building, which may facilitate receiving allergy shots at work.   Otherwise, there have been no changes to his past medical history, surgical history, family history, or social history.    Review of systems otherwise negative other than that mentioned in the HPI.    Objective:   Blood pressure 126/82, pulse 98, temperature 98.2 F (36.8 C), temperature source Temporal, resp. rate 16, SpO2 94%. There is no height or weight on file to calculate BMI.    Physical Exam Vitals reviewed.  Constitutional:      Appearance: He is well-developed.  HENT:     Head: Normocephalic and atraumatic.     Right Ear: Tympanic membrane, ear canal and external ear normal.     Left Ear: Tympanic membrane, ear canal and external ear normal.     Nose: No nasal deformity, septal deviation, mucosal edema or rhinorrhea.     Right Turbinates: Enlarged, swollen and pale.     Left Turbinates: Enlarged, swollen and pale.     Right Sinus: No maxillary sinus tenderness or frontal sinus tenderness.     Left Sinus: No maxillary sinus tenderness or  frontal sinus tenderness.     Comments: No nasal polyps noted.     Mouth/Throat:     Lips: Pink.     Mouth: Mucous membranes are moist. Mucous membranes are not pale and not dry.     Pharynx: Uvula midline.     Comments: No cobblestoning in the posterior oropharynx.  Eyes:     General: Lids are normal. No allergic shiner.       Right eye: No discharge or hordeolum.        Left eye: No discharge or hordeolum.     Conjunctiva/sclera: Conjunctivae normal.     Right eye: Right conjunctiva is not injected. No chemosis.    Left eye: Left conjunctiva is not injected. No chemosis.    Pupils: Pupils are equal, round, and reactive to light.  Cardiovascular:     Rate and Rhythm: Normal rate and regular rhythm.     Heart sounds:  Normal heart sounds.  Pulmonary:     Effort: Pulmonary effort is normal. Prolonged expiration present. No tachypnea, accessory muscle usage or respiratory distress.     Breath sounds: Decreased air movement present. Examination of the right-lower field reveals wheezing. Examination of the left-lower field reveals wheezing. Wheezing present. No rhonchi or rales.     Comments: Decreased air movement at the bases. Some expiratory wheezing appreciated.  Chest:     Chest wall: No tenderness.  Lymphadenopathy:     Cervical: No cervical adenopathy.  Skin:    Coloration: Skin is not pale.     Findings: No abrasion, erythema, petechiae or rash. Rash is not papular, urticarial or vesicular.  Neurological:     Mental Status: He is alert.  Psychiatric:        Behavior: Behavior is cooperative.      Diagnostic studies: deferred since he was actively ill today      Malachi Bonds, MD  [facility]

## 2023-11-04 ENCOUNTER — Ambulatory Visit: Payer: Commercial Managed Care - PPO | Admitting: Nurse Practitioner

## 2023-11-08 ENCOUNTER — Ambulatory Visit: Payer: Commercial Managed Care - PPO | Admitting: Internal Medicine

## 2023-11-08 VITALS — BP 138/86 | HR 107 | Ht 70.0 in | Wt 233.0 lb

## 2023-11-08 DIAGNOSIS — E782 Mixed hyperlipidemia: Secondary | ICD-10-CM | POA: Diagnosis not present

## 2023-11-08 DIAGNOSIS — J4550 Severe persistent asthma, uncomplicated: Secondary | ICD-10-CM

## 2023-11-08 DIAGNOSIS — K219 Gastro-esophageal reflux disease without esophagitis: Secondary | ICD-10-CM | POA: Diagnosis not present

## 2023-11-08 DIAGNOSIS — F411 Generalized anxiety disorder: Secondary | ICD-10-CM

## 2023-11-08 DIAGNOSIS — E88819 Insulin resistance, unspecified: Secondary | ICD-10-CM | POA: Diagnosis not present

## 2023-11-08 DIAGNOSIS — G4733 Obstructive sleep apnea (adult) (pediatric): Secondary | ICD-10-CM | POA: Diagnosis not present

## 2023-11-08 DIAGNOSIS — I10 Essential (primary) hypertension: Secondary | ICD-10-CM | POA: Diagnosis not present

## 2023-11-08 DIAGNOSIS — J0121 Acute recurrent ethmoidal sinusitis: Secondary | ICD-10-CM | POA: Diagnosis not present

## 2023-11-08 DIAGNOSIS — E038 Other specified hypothyroidism: Secondary | ICD-10-CM | POA: Diagnosis not present

## 2023-11-08 DIAGNOSIS — K449 Diaphragmatic hernia without obstruction or gangrene: Secondary | ICD-10-CM | POA: Diagnosis not present

## 2023-11-08 DIAGNOSIS — G43919 Migraine, unspecified, intractable, without status migrainosus: Secondary | ICD-10-CM

## 2023-11-08 MED ORDER — AZITHROMYCIN 250 MG PO TABS: ORAL_TABLET | ORAL | 0 refills | Status: AC

## 2023-11-08 NOTE — Assessment & Plan Note (Signed)
 Well-controlled with Buspar 5 mg BID PRN

## 2023-11-08 NOTE — Assessment & Plan Note (Signed)
Uses CPAP regularly, continues to benefit from it 

## 2023-11-08 NOTE — Assessment & Plan Note (Signed)
 Well controlled with Breztri Uses azelastine spray for allergic rhinitis On Tezspire now On Zyrtec and Singulair for allergies Followed by allergy and immunology clinic

## 2023-11-08 NOTE — Assessment & Plan Note (Addendum)
On statin Checked lipid profile 

## 2023-11-08 NOTE — Assessment & Plan Note (Signed)
Well controlled with zonisamide Followed by neurology

## 2023-11-08 NOTE — Assessment & Plan Note (Signed)
 Started empiric  azithromycin considering persistent symptoms despite symptomatic treatment Continue azelastine and/or Flonase nasal spray for nasal congestion and allergies Use humidifier and/or vaporizer for nasal congestion Continue Singulair for allergies

## 2023-11-08 NOTE — Patient Instructions (Addendum)
 Please start taking Azithromycin as prescribed.  Please continue to take medications as prescribed.  Please continue to follow low salt diet and perform moderate exercise/walking at least 150 mins/week.

## 2023-11-08 NOTE — Assessment & Plan Note (Signed)
 BP Readings from Last 1 Encounters:  11/08/23 138/86   Well-controlled with Hyzaar Counseled for compliance with the medications Advised DASH diet and moderate exercise/walking, at least 150 mins/week

## 2023-11-08 NOTE — Assessment & Plan Note (Addendum)
 Well controlled with pantoprazole 40 mg QAM and Pepcid 40 mg QPM

## 2023-11-08 NOTE — Assessment & Plan Note (Signed)
 Lab Results  Component Value Date   TSH 0.954 05/06/2023   On NP thyroid 90 mg daily Will continue for now Check TSH and free T4

## 2023-11-08 NOTE — Progress Notes (Signed)
 Established Patient Office Visit  Subjective:  Patient ID: Sergio [name], male    DOB: Sep 15, 1975  Age: 48 y.o. MRN: [id number]  CC:  Chief Complaint  Patient presents with   Care Management    Follow up, pt reports vision changes , and pressure on his right eye.     HPI Sergio [name] is a 48 y.o. male with past medical history of HTN, migraine, asthma, OSA, GERD, IBS and HLD who presents for f/u of his chronic medical conditions.  HTN: BP is well-controlled. Takes medications regularly. Patient denies headache, dizziness, chest pain, dyspnea or palpitations.  He also takes Zocor for HLD.  Asthma and allergic rhinitis: He uses Breztri and as needed Xopenex for asthma.  Denies any dyspnea or wheezing currently.  He uses azelastine nasal spray as needed for allergies.  He also takes Zyrtec and Singulair for allergies.  He is on Tezspire now.   He has a history of migraine, for which he takes zonisamide.  He follows up with neurology.  He uses CPAP for OSA.  Denies any major discomfort currently.  Obesity: His weight has been stable despite trying low-carb diet and exercise at times.  He sees healthy weight and wellness clinic.  He reports pain/pressure around the right eye.  He has history of recurrent sinusitis, was recently given oral prednisone by allergy clinic considering chronic cough.  He has been evaluated by optometrist for eye pain, but was told of benign eye exam.  Denies any fever or chills currently.   Past Medical History:  Diagnosis Date   Anxiety    Asthma with allergic rhinitis 03/11/2008   Constipation    Essential hypertension 05/06/2012   GAD (generalized anxiety disorder) 04/13/2022   Gastroesophageal reflux disease with hiatal hernia 05/06/2012   Gastroesophageal reflux disease with hiatal hernia    Hyperlipidemia LDL goal < 160 03/11/2008   Hypothyroidism 10/13/2021   Irritable bowel syndrome with constipation and diarrhea 05/06/2012   Joint pain     Joint pain    Migraine    Obesity (BMI 30.0-34.9) 03/03/2013   Obstructive sleep apnea 11/19/2008   Nocturnal polysomnography 12/14/2008: AHI 20.8, O2 Sat nadir 84%, optimal CPAP 12 cm H2O    Palpitations    Pre-diabetes    Sleep apnea    Swelling of lower extremity    Urticaria    Vitamin D deficiency     Past Surgical History:  Procedure Laterality Date   COLONOSCOPY WITH PROPOFOL N/A 08/06/2021   Procedure: COLONOSCOPY WITH PROPOFOL;  Surgeon: Dolores Frame, MD;  Location: [facility];  Service: Gastroenterology;  Laterality: N/A;  915   POLYPECTOMY  08/06/2021   Procedure: POLYPECTOMY;  Surgeon: Marguerita Merles, Reuel Boom, MD;  Location: [facility];  Service: Gastroenterology;;  cecum and ascending   URETHRAL DILATION      Family History  Problem Relation Age of Onset   Healthy Mother    Anxiety disorder Mother    Early death Father        Accident   Healthy Sister    Healthy Sister    Hypertension Maternal Grandmother    Vaginal cancer Maternal Grandmother    Parkinson's disease Maternal Grandmother    COPD Maternal Grandfather    Breast cancer Paternal Grandmother    Kidney cancer Paternal Grandmother    Diabetes Paternal Grandfather    ALS Paternal Grandfather    Hypertension Maternal Uncle    Diabetes Paternal Aunt    Urticaria Neg  Hx    Immunodeficiency Neg Hx    Eczema Neg Hx    Atopy Neg Hx    Angioedema Neg Hx    Allergic rhinitis Neg Hx    Asthma Neg Hx     Social History   Socioeconomic History   Marital status: Married    Spouse name: Not on file   Number of children: 0   Years of education: college   Highest education level: Associate degree: occupational, Scientist, product/process development, or vocational program  Occupational History   Occupation: Medical Records  Tobacco Use   Smoking status: Never   Smokeless tobacco: Never  Vaping Use   Vaping status: Never Used  Substance and Sexual Activity   Alcohol use: Yes    Comment: occ   Drug use: No    Sexual activity: Yes    Birth control/protection: None  Other Topics Concern   Not on file  Social History Narrative   Works for [employer] in the [employer].  Married for 23 years, no children.   Right-handed.   Occasionally caffeine use.   Social Drivers of Corporate investment banker Strain: Low Risk  (11/08/2023)   Overall Financial Resource Strain (CARDIA)    Difficulty of Paying Living Expenses: Not hard at all  Food Insecurity: No Food Insecurity (11/08/2023)   Hunger Vital Sign    Worried About Running Out of Food in the Last Year: Never true    Ran Out of Food in the Last Year: Never true  Transportation Needs: No Transportation Needs (11/08/2023)   PRAPARE - Administrator, Civil Service (Medical): No    Lack of Transportation (Non-Medical): No  Physical Activity: Insufficiently Active (11/08/2023)   Exercise Vital Sign    Days of Exercise per Week: 3 days    Minutes of Exercise per Session: 30 min  Stress: No Stress Concern Present (11/08/2023)   [employer] of Occupational Health - Occupational Stress Questionnaire    Feeling of Stress : Only a little  Social Connections: Socially Integrated (11/08/2023)   Social Connection and Isolation Panel [NHANES]    Frequency of Communication with Friends and Family: Three times a week    Frequency of Social Gatherings with Friends and Family: Once a week    Attends Religious Services: More than 4 times per year    Active Member of [employer] or Organizations: Yes    Attends Engineer, structural: More than 4 times per year    Marital Status: Married  Catering manager Violence: Not on file    Outpatient Medications Prior to Visit  Medication Sig Dispense Refill   Azelastine HCl 137 MCG/SPRAY SOLN INSTILL 2 SPRAYS INTO BOTH NOSTRILS TWICE DAILY--must have office visit for further refills 30 mL 0   Budeson-Glycopyrrol-Formoterol (BREZTRI AEROSPHERE) 160-9-4.8 MCG/ACT AERO Inhale 2 puffs into the lungs  in the morning and at bedtime. 10.7 g 5   busPIRone (BUSPAR) 5 MG tablet Take 1 tablet by mouth 2 times daily. 60 tablet 5   cetirizine (ZYRTEC) 10 MG tablet Take 1 tablet (10 mg total) by mouth daily. 90 tablet 1   Cholecalciferol (VITAMIN D3) 125 MCG (5000 UT) TABS Take 5,000 Units by mouth daily.     diphenhydrAMINE (BENADRYL) 25 MG tablet Take 50 mg by mouth daily as needed for itching.     EPINEPHrine 0.3 mg/0.3 mL IJ SOAJ injection Inject 0.3 mg into the muscle as needed for anaphylaxis. 2 each 1   famotidine (PEPCID) 40 MG  tablet Take 1 tablet (40 mg total) by mouth daily. 30 tablet 3   fluticasone (FLONASE) 50 MCG/ACT nasal spray Place 2 sprays into both nostrils daily. 16 g 0   hyoscyamine (LEVSIN) 0.125 MG tablet Take 1 tablet (0.125 mg total) by mouth every 6 (six) hours as needed for abdominal pain. 30 tablet 3   levalbuterol (XOPENEX HFA) 45 MCG/ACT inhaler Inhale 4 puffs into the lungs every 4 to 6 hours as needed. 15 g 1   losartan-hydrochlorothiazide (HYZAAR) 100-25 MG tablet Take 1 tablet by mouth daily. 90 tablet 1   montelukast (SINGULAIR) 10 MG tablet Take 1 tablet (10 mg total) by mouth daily. 90 tablet 1   pantoprazole (PROTONIX) 40 MG tablet Take 1 tablet (40 mg total) by mouth daily. 90 tablet 1   predniSONE (DELTASONE) 10 MG tablet Take 1 tablet (10 mg total) by mouth daily with breakfast. 36 tablet 0   scopolamine (TRANSDERM-SCOP) 1 MG/3DAYS Place 1 patch (1.5 mg total) onto the skin every 3 (three) days. 10 patch 1   simvastatin (ZOCOR) 40 MG tablet Take 1 tablet (40 mg total) by mouth at bedtime. 90 tablet 1   tezepelumab-ekko (TEZSPIRE) 210 MG/1. syringe Inject 1.91 mLs (210 mg total) into the skin every 28 (twenty-eight) days. 1.91 mL 11   thyroid (NP THYROID) 90 MG tablet Take 1 tablet (90 mg total) by mouth daily. 90 tablet 1   zonisamide (ZONEGRAN) 100 MG capsule Take 1 capsule (100 mg total) by mouth daily. 30 capsule 2   Facility-Administered Medications  Prior to Visit  Medication Dose Route Frequency Provider Last Rate Last Admin   tezepelumab-ekko (TEZSPIRE) 210 MG/1. syringe 210 mg  210 mg Subcutaneous Q28 days Verlee Monte, MD   210 mg at 10/28/23 1608    Allergies  Allergen Reactions   Other Hives and Swelling    Cashews     ROS Review of Systems  Constitutional:  Negative for chills and fever.  HENT:  Positive for congestion, postnasal drip and sinus pressure. Negative for sore throat.   Eyes:  Negative for pain and discharge.  Respiratory:  Negative for cough and shortness of breath.   Cardiovascular:  Negative for chest pain and palpitations.  Gastrointestinal:  Positive for constipation. Negative for nausea and vomiting.  Endocrine: Negative for polydipsia and polyuria.  Genitourinary:  Negative for dysuria and hematuria.  Musculoskeletal:  Negative for neck pain and neck stiffness.  Skin:  Negative for rash.  Allergic/Immunologic: Positive for environmental allergies.  Neurological:  Positive for headaches. Negative for dizziness, weakness and numbness.  Psychiatric/Behavioral:  Negative for agitation and behavioral problems.       Objective:    Physical Exam Vitals reviewed.  Constitutional:      General: He is not in acute distress.    Appearance: He is obese. He is not diaphoretic.  HENT:     Head: Normocephalic and atraumatic.     Nose: Congestion present.     Right Sinus: Frontal sinus tenderness present.     Left Sinus: No frontal sinus tenderness.     Mouth/Throat:     Lips: Lesions (vesicle below lower lip) present.     Mouth: Mucous membranes are moist.  Eyes:     General: No scleral icterus.    Extraocular Movements: Extraocular movements intact.  Cardiovascular:     Rate and Rhythm: Normal rate and regular rhythm.     Pulses: Normal pulses.     Heart sounds: Normal heart sounds.  No murmur heard. Pulmonary:     Breath sounds: Normal breath sounds. No wheezing or rales.  Musculoskeletal:      Cervical back: Neck supple. No tenderness.     Right lower leg: No edema.     Left lower leg: No edema.  Skin:    General: Skin is warm.     Findings: No rash.  Neurological:     General: No focal deficit present.     Mental Status: He is alert and oriented to person, place, and time.     Sensory: No sensory deficit.     Motor: No weakness.  Psychiatric:        Mood and Affect: Mood normal.        Behavior: Behavior normal.     BP 138/86 (BP Location: Left Arm)   Pulse (!) 107   Ht 5\' 10"  (1.778 m)   Wt 233 lb (105.7 kg)   SpO2 96%   BMI 33.43 kg/m  Wt Readings from Last 3 Encounters:  11/08/23 233 lb (105.7 kg)  10/13/23 222 lb (100.7 kg)  08/11/23 224 lb (101.6 kg)    Lab Results  Component Value Date   TSH 0.954 05/06/2023   Lab Results  Component Value Date   WBC 8.3 05/06/2023   HGB 16.4 05/06/2023   HCT 48.5 05/06/2023   MCV 84 05/06/2023   PLT 291 05/06/2023   Lab Results  Component Value Date   NA 140 06/28/2023   K 4.5 06/28/2023   CO2 23 06/28/2023   GLUCOSE 98 06/28/2023   BUN 13 06/28/2023   CREATININE 1.03 06/28/2023   BILITOT 0.5 06/28/2023   ALKPHOS 77 06/28/2023   AST 15 06/28/2023   ALT 18 06/28/2023   PROT 7.1 06/28/2023   ALBUMIN 4.5 06/28/2023   CALCIUM 9.4 06/28/2023   ANIONGAP 7 08/04/2021   EGFR 89 06/28/2023   Lab Results  Component Value Date   CHOL 161 05/06/2023   Lab Results  Component Value Date   HDL 38 (L) 05/06/2023   Lab Results  Component Value Date   LDLCALC 101 (H) 05/06/2023   Lab Results  Component Value Date   TRIG 123 05/06/2023   Lab Results  Component Value Date   CHOLHDL 4.2 05/06/2023   Lab Results  Component Value Date   HGBA1C 5.6 05/06/2023      Assessment & Plan:   Problem List Items Addressed This Visit       Cardiovascular and Mediastinum   Essential hypertension - Primary (Chronic)   BP Readings from Last 1 Encounters:  11/08/23 138/86   Well-controlled with  Hyzaar Counseled for compliance with the medications Advised DASH diet and moderate exercise/walking, at least 150 mins/week      Relevant Orders   CMP14+EGFR   Migraine   Well controlled with zonisamide Followed by neurology        Respiratory   Asthma with allergic rhinitis (Chronic)   Well controlled with Breztri Uses azelastine spray for allergic rhinitis On Tezspire now On Zyrtec and Singulair for allergies Followed by allergy and immunology clinic      Obstructive sleep apnea (Chronic)   Uses CPAP regularly, continues to benefit from it      Gastroesophageal reflux disease with hiatal hernia (Chronic)   Well controlled with pantoprazole 40 mg QAM and Pepcid 40 mg QPM      Acute recurrent ethmoidal sinusitis   Started empiric  azithromycin considering persistent symptoms despite symptomatic treatment Continue azelastine  and/or Flonase nasal spray for nasal congestion and allergies Use humidifier and/or vaporizer for nasal congestion Continue Singulair for allergies      Relevant Medications   azithromycin (ZITHROMAX) 250 MG tablet     Endocrine   Hypothyroidism   Lab Results  Component Value Date   TSH 0.954 05/06/2023   On NP thyroid 90 mg daily Will continue for now Check TSH and free T4      Relevant Orders   TSH + free T4     Other   Hyperlipidemia (Chronic)   On statin Checked lipid profile      GAD (generalized anxiety disorder)   Well-controlled with Buspar 5 mg BID PRN        Meds ordered this encounter  Medications   azithromycin (ZITHROMAX) 250 MG tablet    Sig: Take 2 tablets on day 1, then 1 tablet daily on days 2 through 5    Dispense:  6 tablet    Refill:  0    Follow-up: Return in about 6 months (around 05/07/2024) for Annual physical.    Anabel Halon, MD

## 2023-11-09 LAB — CMP14+EGFR
ALT: 18 [IU]/L (ref 0–44)
AST: 15 [IU]/L (ref 0–40)
Albumin: 4.6 g/dL (ref 4.1–5.1)
Alkaline Phosphatase: 77 [IU]/L (ref 44–121)
BUN/Creatinine Ratio: 16 (ref 9–20)
BUN: 14 mg/dL (ref 6–24)
Bilirubin Total: 0.3 mg/dL (ref 0.0–1.2)
CO2: 25 mmol/L (ref 20–29)
Calcium: 10.1 mg/dL (ref 8.7–10.2)
Chloride: 98 mmol/L (ref 96–106)
Creatinine, Ser: 0.89 mg/dL (ref 0.76–1.27)
Globulin, Total: 2.8 g/dL (ref 1.5–4.5)
Glucose: 85 mg/dL (ref 70–99)
Potassium: 4.6 mmol/L (ref 3.5–5.2)
Sodium: 142 mmol/L (ref 134–144)
Total Protein: 7.4 g/dL (ref 6.0–8.5)
eGFR: 105 mL/min/{1.73_m2} (ref >59)

## 2023-11-09 LAB — TSH+FREE T4
Free T4: 1.14 ng/dL (ref 0.82–1.77)
TSH: 1.28 u[IU]/mL (ref 0.450–4.500)

## 2023-11-15 ENCOUNTER — Ambulatory Visit: Payer: Commercial Managed Care - PPO | Admitting: Nurse Practitioner

## 2023-11-19 ENCOUNTER — Telehealth: Payer: Self-pay

## 2023-11-19 ENCOUNTER — Other Ambulatory Visit: Payer: Self-pay

## 2023-11-19 ENCOUNTER — Other Ambulatory Visit (HOSPITAL_COMMUNITY): Payer: Self-pay

## 2023-11-19 NOTE — Telephone Encounter (Signed)
 Pending investigation- last sent from our pharmacy 12/27 but patient has received multiple injections.  *Sent to [facility] to look into

## 2023-11-23 ENCOUNTER — Other Ambulatory Visit (HOSPITAL_COMMUNITY): Payer: Self-pay

## 2023-11-23 ENCOUNTER — Encounter: Payer: Self-pay | Admitting: Nurse Practitioner

## 2023-11-23 ENCOUNTER — Ambulatory Visit: Payer: Commercial Managed Care - PPO | Admitting: Nurse Practitioner

## 2023-11-23 VITALS — BP 138/90 | HR 77 | Temp 97.9°F | Ht 70.0 in | Wt 229.0 lb

## 2023-11-23 DIAGNOSIS — E669 Obesity, unspecified: Secondary | ICD-10-CM | POA: Diagnosis not present

## 2023-11-23 DIAGNOSIS — Z6832 Body mass index (BMI) 32.0-32.9, adult: Secondary | ICD-10-CM

## 2023-11-23 DIAGNOSIS — G4733 Obstructive sleep apnea (adult) (pediatric): Secondary | ICD-10-CM

## 2023-11-23 DIAGNOSIS — I10 Essential (primary) hypertension: Secondary | ICD-10-CM | POA: Diagnosis not present

## 2023-11-23 MED ORDER — ZEPBOUND 2.5 MG/0.5ML ~~LOC~~ SOAJ
2.5000 mg | SUBCUTANEOUS | 0 refills | Status: DC
  Filled 2023-11-23 – 2023-11-25 (×3): qty 2, 28d supply, fill #0

## 2023-11-23 NOTE — Progress Notes (Addendum)
 Office: [phone]  /  Fax: [phone]  WEIGHT SUMMARY AND BIOMETRICS  Weight Lost Since Last Visit: 0 lb  Weight Gained Since Last Visit: 7 lb   Vitals Temp: 97.9 F (36.6 C) BP: (!) 138/90 Pulse Rate: 77 SpO2: 96 %   Anthropometric Measurements Height: 5\' 10"  (1.778 m) Weight: 229 lb (103.9 kg) BMI (Calculated): 32.86 Weight at Last Visit: 222 lb Weight Lost Since Last Visit: 0 lb Weight Gained Since Last Visit: 7 lb Starting Weight: 226 lb Total Weight Loss (lbs): 0 lb (0 kg)   Body Composition  Body Fat %: 31.8 % Fat Mass (lbs): 73 lbs Muscle Mass (lbs): 148.8 lbs Total Body Water (lbs): 107 lbs Visceral Fat Rating : 16   Other Clinical Data Fasting: No Labs: No Today's Visit #: 5 Starting Date: 06/28/23     HPI  Chief Complaint: OBESITY  Sergio [name] is here to discuss his progress with his obesity treatment plan. He is on the the Category 3 Plan and states he is following his eating plan approximately 90 % of the time. He states he is exercising 30-45 minutes 3 days per week.   Interval History:  Since last office visit on 10/13/23 he has gained 7 pounds.  He saw Dr. Dellis Anes last on 10/28/23 and was started on prednisone and Pepcid. He continues to take Zyrtec and Astelin.  He also gets montly allergy shots.  He is overall starting to feel better. He is drinking water, coconut water, coffee and zero sugar soda.  He is trying to stay active.    Pharmacotherapy for weight loss: He is not currently taking medications  for medical weight loss.   Previous pharmacotherapy for medical weight loss:  None   Bariatric surgery:  Patient has not had bariatric surgery   Hypertension Hypertension BP is elevated today.  Medication(s): hyzaar 100-25mg .  Denies side effects.  Denies chest pain, palpitations and SOB.  BP Readings from Last 3 Encounters:  11/23/23 (!) 138/90  11/08/23 138/86  10/28/23 126/82   Lab Results  Component Value Date   CREATININE  0.89 11/08/2023   CREATININE 1.03 06/28/2023   CREATININE 0.96 05/06/2023    Obstructive Sleep Apnea Sergio [name] has a diagnosis of sleep apnea. He reports that he is using a CPAP regularly.  Last PSG was 11/14/21 with severe OSAS: The total APNEA/HYPOPNEA INDEX (AHI) was 88.9/hour and the total  RESPIRATORY DISTURBANCE INDEX was 88.9 /hour.   The supine AHI was 93.5 /hour versus a non-supine AHI  of 45.0 /hour.   PHYSICAL EXAM:  Blood pressure (!) 138/90, pulse 77, temperature 97.9 F (36.6 C), height 5\' 10"  (1.778 m), weight 229 lb (103.9 kg), SpO2 96%. Body mass index is 32.86 kg/m.  General: He is overweight, cooperative, alert, well developed, and in no acute distress. PSYCH: Has normal mood, affect and thought process.   Extremities: No edema.  Neurologic: No gross sensory or motor deficits. No tremors or fasciculations noted.    DIAGNOSTIC DATA REVIEWED:  BMET    Component Value Date/Time   NA 142 11/08/2023 1608   K 4.6 11/08/2023 1608   CL 98 11/08/2023 1608   CO2 25 11/08/2023 1608   GLUCOSE 85 11/08/2023 1608   GLUCOSE 111 (H) 08/04/2021 0901   BUN 14 11/08/2023 1608   CREATININE 0.89 11/08/2023 1608   CREATININE 1.11 04/01/2021 1756   CALCIUM 10.1 11/08/2023 1608   GFRNONAA >60 08/04/2021 0901   GFRNONAA 94 07/11/2020 1708   GFRAA 109 07/11/2020  1708   Lab Results  Component Value Date   HGBA1C 5.6 05/06/2023   HGBA1C 5.5 11/13/2019   Lab Results  Component Value Date   INSULIN 13.5 06/28/2023   Lab Results  Component Value Date   TSH 1.280 11/08/2023   CBC    Component Value Date/Time   WBC 8.3 05/06/2023 1611   WBC 12.5 (H) 04/01/2021 1756   RBC 5.79 05/06/2023 1611   RBC 5.84 (H) 04/01/2021 1756   HGB 16.4 05/06/2023 1611   HCT 48.5 05/06/2023 1611   PLT 291 05/06/2023 1611   MCV 84 05/06/2023 1611   MCH 28.3 05/06/2023 1611   MCH 28.8 04/01/2021 1756   MCHC 33.8 05/06/2023 1611   MCHC 33.8 04/01/2021 1756   RDW 13.4 05/06/2023 1611    Iron Studies No results found for: "IRON", "TIBC", "FERRITIN", "IRONPCTSAT" Lipid Panel     Component Value Date/Time   CHOL 161 05/06/2023 1611   TRIG 123 05/06/2023 1611   HDL 38 (L) 05/06/2023 1611   CHOLHDL 4.2 05/06/2023 1611   CHOLHDL 5.2 (H) 07/11/2020 1708   VLDL 29 03/06/2013 0844   LDLCALC 101 (H) 05/06/2023 1611   LDLCALC 147 (H) 07/11/2020 1708   Hepatic Function Panel     Component Value Date/Time   PROT 7.4 11/08/2023 1608   ALBUMIN 4.6 11/08/2023 1608   AST 15 11/08/2023 1608   ALT 18 11/08/2023 1608   ALKPHOS 77 11/08/2023 1608   BILITOT 0.3 11/08/2023 1608      Component Value Date/Time   TSH 1.280 11/08/2023 1608   Nutritional Lab Results  Component Value Date   VD25OH 54.4 05/06/2023   VD25OH 72.8 04/13/2022   VD25OH 56 04/01/2021     ASSESSMENT AND PLAN  TREATMENT PLAN FOR OBESITY:  Recommended Dietary Goals  Sergio [name] is currently in the action stage of change. As such, his goal is to continue weight management plan. He has agreed to the Category 3 Plan.  Behavioral Intervention  We discussed the following Behavioral Modification Strategies today: increasing lean protein intake to established goals, decreasing simple carbohydrates , increasing fiber rich foods, increasing water intake , reading food labels , keeping healthy foods at home, continue to work on implementation of reduced calorie nutritional plan, continue to practice mindfulness when eating, and continue to work on maintaining a reduced calorie state, getting the recommended amount of protein, incorporating whole foods, making healthy choices, staying well hydrated and practicing mindfulness when eating..  Additional resources provided today: NA  Recommended Physical Activity Goals  Sergio [name] has been advised to work up to 150 minutes of moderate intensity aerobic activity a week and strengthening exercises 2-3 times per week for cardiovascular health, weight loss maintenance and  preservation of muscle mass.   He has agreed to Think about enjoyable ways to increase daily physical activity and overcoming barriers to exercise, Increase physical activity in their day and reduce sedentary time (increase NEAT)., Increase the intensity, frequency or duration of strengthening exercises , and Increase the intensity, frequency or duration of aerobic exercises     Pharmacotherapy We discussed various medication options to help Sergio [name] with his weight loss efforts and we both agreed to start Zepbound 2.5mg .  Side effects discussed.  Contraindications: Pancreatitis (active gallstones) Medullary thyroid cancer High triglycerides (>500)-will need labs prior to starting Multiple Endocrine Neoplasia syndrome type 2 (MEN 2) Trying to get pregnant Breastfeeding Use with caution with taking insulin or sulfonylureas (will need to monitor blood sugars for hypoglycemia)  Avoid Qsymia due to taking Zonegran  ASSOCIATED CONDITIONS ADDRESSED TODAY  Action/Plan  Essential hypertension Continue to follow up with PCP. Continue meds as directed.  Will continue to monitor.    Obstructive sleep apnea -     Zepbound; Inject 2.5 mg into the skin once a week.  Dispense: 2 mL; Refill: 0.  Side effects discussed.  Continue CPAP nightly.  Generalized obesity  BMI 32.0-32.9,adult     12/09/23 patient unable to start Zepbound due to cost.  Will start Contrave.  Side effects discussed.    Return in about 4 weeks (around 12/21/2023).Marland Kitchen He was informed of the importance of frequent follow up visits to maximize his success with intensive lifestyle modifications for his multiple health conditions.   ATTESTASTION STATEMENTS:  Reviewed by clinician on day of visit: allergies, medications, problem list, medical history, surgical history, family history, social history, and previous encounter notes.     Sergio [name]. Appolonia Ackert FNP-C

## 2023-11-23 NOTE — Patient Instructions (Addendum)
Steps to starting your start Contrave  The office will send a prior authorization request to your insurance company for approval. We will send you a mychart message once we hear back from your insurance with a decision.  This can take up to 7-10 business days.   Is Contrave an option for me? Do not take Contrave if you:   Have uncontrolled hypertension  Have or have had seizures  Use other medicines that contain bupropion such as Wellbutrin, Wellbutrin SR, Wellbutrin XL, and Aplenzin.  Have or have had an eating disorder called anorexia (eating very little) or bulimia (eating too much and vomiting to avoid gaining weight)  Are dependent on opioid pain medicines or use medicines to help stop taking opioids such as methadone or buprenorphine, or are in opiate withdrawal  Drink a lot of alcohol and abruptly stop drinking, or use medicines called sedatives (these make you sleepy), benzodiazepines, or anti-seizure medicines and you stop using them all of a sudden  Are taking medicines called monoamine oxidase inhibitors (MAOIs). Ask your healthcare provider or pharmacist if you are not sure if you take an MAOI, including linezoid. Do not start Contrave until you have stopped taking your MAOI for at least 14 days.  Are allergic to naltrexone HCl or bupropion HCl or any of the ingredients in Contrave. See the end of this Medication Guide for a complete list of ingredients in Contrave.  Are pregnant or planning to become pregnant. Tell your healthcare provider right away if you become pregnant while taking CONTRAVE  What is Contrave and how does it work?  Contrave is a prescription only medicine to help with your weight loss. It works on an area of the brain that controls your appetite.  It is a combination of two medicines that are extended release: naltrexone HCL and bupropion HCL.  One of the ingredients in Contrave, bupropion, is the same ingredient in some other medicines used to treat  depression and to help people quit smoking. However, Contrave is not approved to treat depression or other mental illnesses, or to help people quit smoking (smoking cessation).   This medicine will be most effective when combined with a reduced calorie diet and physical activity.  How should I take Contrave?  Take exactly as your provider tells you to. It is an increasing dose according to the following chart:  Contrave How should I take Contrave?   It is best to take Contrave with food. However, do not take with high-fat meals.   Swallow the extended-release tablet whole. Do not crush, break, or chew it.   If you miss a dose, skip the missed dose and go back to your regular dosing schedule. Do not take extra medicine to make up for a missed dose.  Do not take more than 2 tablets in the morning and 2 tablets in the evening.  Do not take more than 2 tablets at the same time or more than 4 tablets in 1 day.  Do not stop taking Contrave without talking to your provider.   Stopping Contrave suddenly can cause serious side effects, such as seizures.  Swallow Contrave tablets whole. Do not cut, chew, or crush tablets.  Do not take Contrave when eating a high-fat meal. It may increase your risk of seizures.   What should I avoid while taking Contrave?  You should avoid other medicines that contain bupropion such as Wellbutrin, Wellbutrin SR, Wellbutrin XL and Aplenzin.  You should avoid opioid pain medications or medicines   to help stop taking opioids such as methadone or buprenorphine. There may be a risk of opioid overdose.  Do not drink alcohol while taking Contrave. If you drink alcohol, talk to your provider before starting Contrave.   Avoid eating a high-fat meal at the same time you take Contrave. It may increase your risk of seizures.   Women who can become pregnant: Use effective birth control (contraception) consistently while taking Contrave. If you miss a menstrual period,  STOP Contrave and call our office immediately. Monthly pregnancy tests will be performed at your appointment if indicated.  What side effects may I notice when taking Contrave?  Side effects that usually do not require medical attention (report to our office if they continue or are bothersome): o Nausea o Constipation (you may take over the counter laxative if needed) o Headache o Dry mouth (drink at least 64 oz. of fluid daily) o Trouble sleeping (insomnia) o Vomiting o Dizziness o Diarrhea   Side effects that you should report to our office as soon as possible: o Seizures: DO NOT take Contrave again o Depression or severe changes in mood o Thoughts about suicide or dying o Feeling anxious, agitated, irritable, restless, or nervous o Slowed breathing and/or shallow breathing o Severe drowsiness o Confusion o Signs of allergic reaction such as rash, itching, hives, fever, swollen lymph glands, painful sores in your mouth or around your eyes, swelling of your lips or tongue, chest pain, or trouble breathing o Increased blood pressure o Increased heart rate or palpitations o Stomach pain lasting more than a few days o Dark urine o Yellowing of the whites of your eyes o Severe tiredness o Sudden changes in vision o Swelling or redness in or around the eye  o Low blood sugar in Type 2 Diabetes.   Other important information  While taking CONTRAVE, you or your family members should pay close attention to any changes, especially sudden changes, in mood, behaviors, thoughts, or feelings and maintain communication with your provider. You will be asked to sign an informed consent prior to starting Contrave.  If another healthcare provider prescribes narcotic pain medication for you, please call our office immediately for advice regarding Contrave.  Your prescription will be sent electronically to your pharmacy.   Refills will require an office visit     What is a GLP-1 Glucagon  like peptide-1 (GLP-1) agonists represent a class of medications used to treat type 2 diabetes mellitus and obesity.  GLP-1 medications mimic the action of a hormone called glucagon like peptide 1.  When blood sugar levels start to rise/increase these drugs stimulate the body to produce more insulin.  When that happens, the extra insulin helps to lower the blood sugar levels in the body.  This in returns helps with decreasing cravings.  These medications also slow the movement of food from the stomach into the small intestine.  This in return helps one to full faster and longer.   Diabetic medications: Approved for treatment of diabetes mellitus but does not have full approval for weight loss use Victoza (liraglutide) Ozempic (semaglutide) Mounjaro Trulicity Rybelsus  Weight loss medications: Approved for long-term weight loss use.        Saxenda (liraglutide) Wegovy (semaglutide) Zepbound Contraindications:  Pancreatitis (active gallstones) Medullary thyroid cancer High triglycerides (>500)-will need labs prior to starting Multiple Endocrine Neoplasia syndrome type 2 (MEN 2) Trying to get pregnant Breastfeeding Use with caution with taking insulin or sulfonylureas (will need to monitor blood sugars for   hypoglycemia) Side effects (most common): Most common side effects are nausea, gas, bloating and constipation.  Other possible side effects are headaches, belching, diarrhea, tiredness (fatigue), vomiting, upset stomach, dizziness, heartburn and stomach (abdominal pain).  If you think that you are becoming dehydrated, please inform our office or your primary family provider.  Stop immediately and go to ER if you have any symptoms of a serious allergic reaction including swelling of your face, lips, tongue or throat; problems breathing or swallowing; severe rash or itching; fainting or feeling dizzy; or very rapid heart rate.                                                                                           

## 2023-11-24 ENCOUNTER — Other Ambulatory Visit (HOSPITAL_COMMUNITY): Payer: Self-pay

## 2023-11-25 ENCOUNTER — Ambulatory Visit: Payer: Commercial Managed Care - PPO

## 2023-11-25 ENCOUNTER — Telehealth: Payer: Self-pay | Admitting: *Deleted

## 2023-11-25 ENCOUNTER — Other Ambulatory Visit (HOSPITAL_COMMUNITY): Payer: Self-pay

## 2023-11-25 NOTE — Telephone Encounter (Signed)
 Prior authorization done via cover my meds for patients Zepbound. Per insurance it is denied.  Information regarding your request This drug/product is not covered under the pharmacy benefit. Prior Authorization is not available.

## 2023-11-30 ENCOUNTER — Other Ambulatory Visit (HOSPITAL_COMMUNITY): Payer: Self-pay

## 2023-11-30 ENCOUNTER — Other Ambulatory Visit: Payer: Self-pay

## 2023-11-30 NOTE — Progress Notes (Signed)
 Specialty Pharmacy Refill Coordination Note  Sergio [name] is a 48 y.o. male contacted today regarding refills of specialty medication(s) Daiva Huge Dorothea Ogle)   Patient requested Courier to Provider Office   Delivery date: 12/02/23   [facility]   [address] [place] [address]   [place]   [place] [address]   Medication will be filled on 12/01/23.

## 2023-11-30 NOTE — Progress Notes (Signed)
 Specialty Pharmacy Ongoing Clinical Assessment Note  Sergio [name] is a 48 y.o. male who is being followed by the specialty pharmacy service for RxSp Asthma/COPD   Patient's specialty medication(s) reviewed today: Tezepelumab-ekko (Tezspire)   Missed doses in the last 4 weeks: 0   Patient/Caregiver did not have any additional questions or concerns.   Therapeutic benefit summary: Patient is achieving benefit   Adverse events/side effects summary: No adverse events/side effects   Patient's therapy is appropriate to: Continue    Goals Addressed             This Visit's Progress    Minimize recurrence of flares       Patient is on track. Patient will maintain adherence         Follow up:  6 months  Servando Snare Specialty Pharmacist

## 2023-12-09 ENCOUNTER — Other Ambulatory Visit (HOSPITAL_COMMUNITY): Payer: Self-pay

## 2023-12-09 ENCOUNTER — Other Ambulatory Visit: Payer: Self-pay | Admitting: Nurse Practitioner

## 2023-12-09 MED ORDER — CONTRAVE 8-90 MG PO TB12
ORAL_TABLET | ORAL | 0 refills | Status: DC
  Filled 2023-12-09: qty 120, 47d supply, fill #0

## 2023-12-09 NOTE — Addendum Note (Signed)
 Addended by: Irene Limbo on: 12/09/2023 02:43 PM   Modules accepted: Orders

## 2023-12-10 ENCOUNTER — Other Ambulatory Visit (HOSPITAL_COMMUNITY): Payer: Self-pay

## 2023-12-11 DIAGNOSIS — H524 Presbyopia: Secondary | ICD-10-CM | POA: Diagnosis not present

## 2023-12-13 ENCOUNTER — Other Ambulatory Visit: Payer: Self-pay

## 2023-12-13 ENCOUNTER — Other Ambulatory Visit: Payer: Self-pay | Admitting: Allergy & Immunology

## 2023-12-13 ENCOUNTER — Other Ambulatory Visit (HOSPITAL_COMMUNITY): Payer: Self-pay

## 2023-12-13 ENCOUNTER — Other Ambulatory Visit: Payer: Self-pay | Admitting: Internal Medicine

## 2023-12-13 ENCOUNTER — Telehealth: Payer: Self-pay

## 2023-12-13 DIAGNOSIS — E782 Mixed hyperlipidemia: Secondary | ICD-10-CM

## 2023-12-13 DIAGNOSIS — E039 Hypothyroidism, unspecified: Secondary | ICD-10-CM

## 2023-12-13 DIAGNOSIS — I10 Essential (primary) hypertension: Secondary | ICD-10-CM

## 2023-12-13 MED ORDER — AZELASTINE HCL 137 MCG/SPRAY NA SOLN
NASAL | 0 refills | Status: DC
  Filled 2023-12-13: qty 30, 30d supply, fill #0

## 2023-12-13 MED ORDER — MONTELUKAST SODIUM 10 MG PO TABS
10.0000 mg | ORAL_TABLET | Freq: Every day | ORAL | 1 refills | Status: DC
  Filled 2023-12-13: qty 90, 90d supply, fill #0
  Filled 2024-03-14: qty 90, 90d supply, fill #1

## 2023-12-13 MED ORDER — SIMVASTATIN 40 MG PO TABS
40.0000 mg | ORAL_TABLET | Freq: Every day | ORAL | 1 refills | Status: DC
  Filled 2023-12-13: qty 90, 90d supply, fill #0
  Filled 2024-03-14: qty 90, 90d supply, fill #1

## 2023-12-13 MED ORDER — LOSARTAN POTASSIUM-HCTZ 100-25 MG PO TABS
1.0000 | ORAL_TABLET | Freq: Every day | ORAL | 1 refills | Status: DC
  Filled 2023-12-13: qty 90, 90d supply, fill #0
  Filled 2024-03-14: qty 90, 90d supply, fill #1

## 2023-12-13 MED ORDER — THYROID 90 MG PO TABS
90.0000 mg | ORAL_TABLET | Freq: Every day | ORAL | 1 refills | Status: DC
  Filled 2023-12-13: qty 90, 90d supply, fill #0
  Filled 2024-03-14: qty 90, 90d supply, fill #1

## 2023-12-13 MED ORDER — CETIRIZINE HCL 10 MG PO TABS
10.0000 mg | ORAL_TABLET | Freq: Every day | ORAL | 1 refills | Status: DC
  Filled 2023-12-13: qty 90, 90d supply, fill #0
  Filled 2024-03-14: qty 90, 90d supply, fill #1

## 2023-12-13 NOTE — Telephone Encounter (Signed)
 Received fax from [employer] that Contrave has been approved through 04/08/24.

## 2023-12-13 NOTE — Telephone Encounter (Signed)
 Approved today by [employer] 2017  The request has been approved. The authorization is effective for a maximum of 1 fills from 12/13/2023 to 01/12/2024, as long as the member is enrolled in their current health plan. The request was approved as submitted. This request is approved for 78 tablets for 30 days.  An additional prior authorization [id number] has been entered for the requested agent: Contrave, this request is effective from 12/13/2023 and is valid until 04/08/2024 and is limited to 4 tablets per day.   A written notification letter will follow with additional details.  Effective Date: 12/13/2023 Authorization Expiration Date: 01/12/2024

## 2023-12-13 NOTE — Telephone Encounter (Signed)
 PA submitted through Cover [employer] Med[employer] for Contrave. Awaiting insurance determination. Key: Sergio [name]

## 2023-12-14 ENCOUNTER — Other Ambulatory Visit (HOSPITAL_COMMUNITY): Payer: Self-pay

## 2023-12-30 ENCOUNTER — Other Ambulatory Visit (HOSPITAL_COMMUNITY): Payer: Self-pay

## 2023-12-30 ENCOUNTER — Ambulatory Visit: Admitting: Nurse Practitioner

## 2023-12-30 ENCOUNTER — Encounter: Payer: Self-pay | Admitting: Nurse Practitioner

## 2023-12-30 VITALS — BP 123/87 | HR 75 | Temp 97.8°F | Ht 70.0 in | Wt 225.0 lb

## 2023-12-30 DIAGNOSIS — Z6832 Body mass index (BMI) 32.0-32.9, adult: Secondary | ICD-10-CM | POA: Diagnosis not present

## 2023-12-30 DIAGNOSIS — E66811 Obesity, class 1: Secondary | ICD-10-CM

## 2023-12-30 DIAGNOSIS — I10 Essential (primary) hypertension: Secondary | ICD-10-CM

## 2023-12-30 MED ORDER — CONTRAVE 8-90 MG PO TB12
2.0000 | ORAL_TABLET | Freq: Two times a day (BID) | ORAL | 0 refills | Status: DC
  Filled 2023-12-30: qty 120, 60d supply, fill #0
  Filled 2024-01-18 – 2024-01-20 (×2): qty 120, 30d supply, fill #0

## 2023-12-30 NOTE — Progress Notes (Signed)
 Office: [phone]  /  Fax: [phone]  WEIGHT SUMMARY AND BIOMETRICS  Weight Lost Since Last Visit: 4lb  Weight Gained Since Last Visit: 0lb   Vitals Temp: 97.8 F (36.6 C) BP: 123/87 Pulse Rate: 75 SpO2: 95 %   Anthropometric Measurements Height: 5\' 10"  (1.778 m) Weight: 225 lb (102.1 kg) BMI (Calculated): 32.28 Weight at Last Visit: 229lb Weight Lost Since Last Visit: 4lb Weight Gained Since Last Visit: 0lb Starting Weight: 226lb Total Weight Loss (lbs): 1 lb (0.454 kg)   Body Composition  Body Fat %: 30.4 % Fat Mass (lbs): 68.4 lbs Muscle Mass (lbs): 149 lbs Total Body Water (lbs): 101 lbs Visceral Fat Rating : 15   Other Clinical Data Fasting: Yes Labs: No Today's Visit #: 6 Starting Date: 06/28/23     HPI  Chief Complaint: OBESITY  Sergio [name] is here to discuss his progress with his obesity treatment plan. He is on the the Category 3 Plan and states he is following his eating plan approximately 60 % of the time. He states he is exercising 30 minutes 5-6 days per week.   Interval History:  Since last office visit he has lost 4 pounds. He has been under stress at work and sometimes skips meals due to his busy schedule.  He does well with his meal plan when he's not working.   He denies stress eating and feels that Contrave has helped with polyphagia and cravings.  He is trying to get a protein in with each meal. He is drinking water, coffee and zero soda.   Pharmacotherapy for weight loss: He is currently taking Contrave 2am and 1 pm  for medical weight loss.  Denies side effects.    Unable to start Zepbound due to cost.     Previous pharmacotherapy for medical weight loss:  None   Bariatric surgery:  Patient has not had bariatric surgery   Hypertension Hypertension looks better today.  Medication(s): Hyzaar 100-25mg . Denies side effects.  Denies chest pain, palpitations and SOB.  BP Readings from Last 3 Encounters:  12/30/23 123/87   11/23/23 (!) 138/90  11/08/23 138/86   Lab Results  Component Value Date   CREATININE 0.89 11/08/2023   CREATININE 1.03 06/28/2023   CREATININE 0.96 05/06/2023     PHYSICAL EXAM:  Blood pressure 123/87, pulse 75, temperature 97.8 F (36.6 C), height 5\' 10"  (1.778 m), weight 225 lb (102.1 kg), SpO2 95%. Body mass index is 32.28 kg/m.  General: He is overweight, cooperative, alert, well developed, and in no acute distress. PSYCH: Has normal mood, affect and thought process.   Extremities: No edema.  Neurologic: No gross sensory or motor deficits. No tremors or fasciculations noted.    DIAGNOSTIC DATA REVIEWED:  BMET    Component Value Date/Time   NA 142 11/08/2023 1608   K 4.6 11/08/2023 1608   CL 98 11/08/2023 1608   CO2 25 11/08/2023 1608   GLUCOSE 85 11/08/2023 1608   GLUCOSE 111 (H) 08/04/2021 0901   BUN 14 11/08/2023 1608   CREATININE 0.89 11/08/2023 1608   CREATININE 1.11 04/01/2021 1756   CALCIUM 10.1 11/08/2023 1608   GFRNONAA >60 08/04/2021 0901   GFRNONAA 94 07/11/2020 1708   GFRAA 109 07/11/2020 1708   Lab Results  Component Value Date   HGBA1C 5.6 05/06/2023   HGBA1C 5.5 11/13/2019   Lab Results  Component Value Date   INSULIN 13.5 06/28/2023   Lab Results  Component Value Date   TSH 1.280 11/08/2023  CBC    Component Value Date/Time   WBC 8.3 05/06/2023 1611   WBC 12.5 (H) 04/01/2021 1756   RBC 5.79 05/06/2023 1611   RBC 5.84 (H) 04/01/2021 1756   HGB 16.4 05/06/2023 1611   HCT 48.5 05/06/2023 1611   PLT 291 05/06/2023 1611   MCV 84 05/06/2023 1611   MCH 28.3 05/06/2023 1611   MCH 28.8 04/01/2021 1756   MCHC 33.8 05/06/2023 1611   MCHC 33.8 04/01/2021 1756   RDW 13.4 05/06/2023 1611   Iron Studies No results found for: "IRON", "TIBC", "FERRITIN", "IRONPCTSAT" Lipid Panel     Component Value Date/Time   CHOL 161 05/06/2023 1611   TRIG 123 05/06/2023 1611   HDL 38 (L) 05/06/2023 1611   CHOLHDL 4.2 05/06/2023 1611   CHOLHDL  5.2 (H) 07/11/2020 1708   VLDL 29 03/06/2013 0844   LDLCALC 101 (H) 05/06/2023 1611   LDLCALC 147 (H) 07/11/2020 1708   Hepatic Function Panel     Component Value Date/Time   PROT 7.4 11/08/2023 1608   ALBUMIN 4.6 11/08/2023 1608   AST 15 11/08/2023 1608   ALT 18 11/08/2023 1608   ALKPHOS 77 11/08/2023 1608   BILITOT 0.3 11/08/2023 1608      Component Value Date/Time   TSH 1.280 11/08/2023 1608   Nutritional Lab Results  Component Value Date   VD25OH 54.4 05/06/2023   VD25OH 72.8 04/13/2022   VD25OH 56 04/01/2021     ASSESSMENT AND PLAN  TREATMENT PLAN FOR OBESITY:  Recommended Dietary Goals  Sergio [name] is currently in the action stage of change. As such, his goal is to continue weight management plan. He has agreed to the Category 3 Plan.  Behavioral Intervention  We discussed the following Behavioral Modification Strategies today: increasing lean protein intake to established goals, decreasing simple carbohydrates , increasing vegetables, increasing fiber rich foods, increasing water intake , work on meal planning and preparation, reading food labels , keeping healthy foods at home, and continue to work on maintaining a reduced calorie state, getting the recommended amount of protein, incorporating whole foods, making healthy choices, staying well hydrated and practicing mindfulness when eating..  Additional resources provided today: protein shakes  Recommended Physical Activity Goals  Sergio [name] has been advised to work up to 150 minutes of moderate intensity aerobic activity a week and strengthening exercises 2-3 times per week for cardiovascular health, weight loss maintenance and preservation of muscle mass.   He has agreed to Think about enjoyable ways to increase daily physical activity and overcoming barriers to exercise, Increase physical activity in their day and reduce sedentary time (increase NEAT)., Increase the intensity, frequency or duration of strengthening  exercises , and Increase the intensity, frequency or duration of aerobic exercises     Pharmacotherapy We discussed various medication options to help Sergio [name] with his weight loss efforts and we both agreed to continue Contrave 1 po BID.  Side effects discussed.  Avoid Qsymia due to taking Zonegran   ASSOCIATED CONDITIONS ADDRESSED TODAY  Action/Plan  Essential hypertension Looks better Continue to follow up with PCP.  Continue meds as directed  Class 1 obesity due to excess calories with serious comorbidity and body mass index (BMI) of 32.0 to 32.9 in adult -     Contrave; Take 1 po BID  Dispense: 120 tablet; Refill: 0.  Side effects discussed.  Will increase as needed.           Return in about 4 weeks (around 01/27/2024).Sergio [name] He was informed  of the importance of frequent follow up visits to maximize his success with intensive lifestyle modifications for his multiple health conditions.   ATTESTASTION STATEMENTS:  Reviewed by clinician on day of visit: allergies, medications, problem list, medical history, surgical history, family history, social history, and previous encounter notes.     Sergio [name]. Tomie Elko FNP-C

## 2024-01-18 ENCOUNTER — Other Ambulatory Visit: Payer: Self-pay

## 2024-01-18 ENCOUNTER — Other Ambulatory Visit: Payer: Self-pay | Admitting: Allergy & Immunology

## 2024-01-18 ENCOUNTER — Other Ambulatory Visit (HOSPITAL_COMMUNITY): Payer: Self-pay

## 2024-01-18 MED ORDER — AZELASTINE HCL 137 MCG/SPRAY NA SOLN
NASAL | 0 refills | Status: DC
  Filled 2024-01-18: qty 30, 30d supply, fill #0

## 2024-01-19 ENCOUNTER — Encounter: Payer: Self-pay | Admitting: Internal Medicine

## 2024-01-19 ENCOUNTER — Other Ambulatory Visit (HOSPITAL_COMMUNITY): Payer: Self-pay

## 2024-01-20 ENCOUNTER — Other Ambulatory Visit (HOSPITAL_COMMUNITY): Payer: Self-pay

## 2024-01-27 ENCOUNTER — Ambulatory Visit: Admitting: Nurse Practitioner

## 2024-02-11 ENCOUNTER — Other Ambulatory Visit (HOSPITAL_COMMUNITY): Payer: Self-pay

## 2024-02-11 DIAGNOSIS — M545 Low back pain, unspecified: Secondary | ICD-10-CM | POA: Diagnosis not present

## 2024-02-11 DIAGNOSIS — M7061 Trochanteric bursitis, right hip: Secondary | ICD-10-CM | POA: Diagnosis not present

## 2024-02-11 MED ORDER — METHYLPREDNISOLONE 4 MG PO TBPK
ORAL_TABLET | ORAL | 0 refills | Status: DC
  Filled 2024-02-11: qty 21, 6d supply, fill #0

## 2024-02-22 ENCOUNTER — Ambulatory Visit: Admitting: Nurse Practitioner

## 2024-02-22 ENCOUNTER — Other Ambulatory Visit (HOSPITAL_COMMUNITY): Payer: Self-pay

## 2024-02-22 ENCOUNTER — Encounter: Payer: Self-pay | Admitting: Nurse Practitioner

## 2024-02-22 VITALS — BP 121/86 | HR 81 | Temp 98.0°F | Ht 70.0 in | Wt 226.0 lb

## 2024-02-22 DIAGNOSIS — Z6832 Body mass index (BMI) 32.0-32.9, adult: Secondary | ICD-10-CM

## 2024-02-22 DIAGNOSIS — E66811 Obesity, class 1: Secondary | ICD-10-CM | POA: Diagnosis not present

## 2024-02-22 DIAGNOSIS — Z79899 Other long term (current) drug therapy: Secondary | ICD-10-CM | POA: Diagnosis not present

## 2024-02-22 DIAGNOSIS — E88819 Insulin resistance, unspecified: Secondary | ICD-10-CM

## 2024-02-22 DIAGNOSIS — E6609 Other obesity due to excess calories: Secondary | ICD-10-CM

## 2024-02-22 MED ORDER — METFORMIN HCL 500 MG PO TABS
500.0000 mg | ORAL_TABLET | Freq: Every day | ORAL | 0 refills | Status: DC
  Filled 2024-02-22: qty 30, 30d supply, fill #0

## 2024-02-22 NOTE — Progress Notes (Signed)
 Office: [phone]  /  Fax: [phone]  WEIGHT SUMMARY AND BIOMETRICS  Weight Lost Since Last Visit: 0lb  Weight Gained Since Last Visit: 1lb   Vitals Temp: 98 F (36.7 C) BP: 121/86 Pulse Rate: 81 SpO2: 96 %   Anthropometric Measurements Height: 5\' 10"  (1.778 m) Weight: 226 lb (102.5 kg) BMI (Calculated): 32.43 Weight at Last Visit: 225lb Weight Lost Since Last Visit: 0lb Weight Gained Since Last Visit: 1lb Starting Weight: 226lb Total Weight Loss (lbs): 0 lb (0 kg)   Body Composition  Body Fat %: 30.8 % Fat Mass (lbs): 69.6 lbs Muscle Mass (lbs): 148.8 lbs Total Body Water  (lbs): 104.4 lbs Visceral Fat Rating : 15   Other Clinical Data Fasting: Yes Labs: No Today's Visit #: 7 Starting Date: 06/28/23     HPI  Chief Complaint: OBESITY  Sergio [name] is here to discuss his progress with his obesity treatment plan. He is on the the Category 3 Plan and states he is following his eating plan approximately 85-90 % of the time. He states he is exercising 30-60 minutes 2 days per week.   Interval History:  Since last office visit he has gained 1 pound. He is eating 3 meals with a protein per day. He is drinking water  and a zero soda daily.  Denies sugary drinks.  He hurt his back since his last visit and hasn't been able to exercise. He saw ortho on 02/11/24 and was treated with prednisone  and a right trochanteric bursitis injection.  He is starting PT this week.  He notes polyphagia and cravings especially in the evenings.     Pharmacotherapy for weight loss: He is currently taking Contrave  2am for medical weight loss.  Denies side effects.     Unable to start Zepbound  due to cost.     Previous pharmacotherapy for medical weight loss:  None   Bariatric surgery:  Patient has not had bariatric surgery   Insulin  Resistance Last fasting insulin  was 13.5. A1c was 5.6. Polyphagia:Yes Medication(s): None Lab Results  Component Value Date   HGBA1C 5.6 05/06/2023    HGBA1C 5.4 04/13/2022   HGBA1C 5.3 04/01/2021   HGBA1C 5.5 11/13/2019   Lab Results  Component Value Date   INSULIN  13.5 06/28/2023      PHYSICAL EXAM:  Blood pressure 121/86, pulse 81, temperature 98 F (36.7 C), height 5\' 10"  (1.778 m), weight 226 lb (102.5 kg), SpO2 96%. Body mass index is 32.43 kg/m.  General: He is overweight, cooperative, alert, well developed, and in no acute distress. PSYCH: Has normal mood, affect and thought process.   Extremities: No edema.  Neurologic: No gross sensory or motor deficits. No tremors or fasciculations noted.    DIAGNOSTIC DATA REVIEWED:  BMET    Component Value Date/Time   NA 142 11/08/2023 1608   K 4.6 11/08/2023 1608   CL 98 11/08/2023 1608   CO2 25 11/08/2023 1608   GLUCOSE 85 11/08/2023 1608   GLUCOSE 111 (H) 08/04/2021 0901   BUN 14 11/08/2023 1608   CREATININE 0.89 11/08/2023 1608   CREATININE 1.11 04/01/2021 1756   CALCIUM 10.1 11/08/2023 1608   GFRNONAA >60 08/04/2021 0901   GFRNONAA 94 07/11/2020 1708   GFRAA 109 07/11/2020 1708   Lab Results  Component Value Date   HGBA1C 5.6 05/06/2023   HGBA1C 5.5 11/13/2019   Lab Results  Component Value Date   INSULIN  13.5 06/28/2023   Lab Results  Component Value Date   TSH 1.280 11/08/2023  CBC    Component Value Date/Time   WBC 8.3 05/06/2023 1611   WBC 12.5 (H) 04/01/2021 1756   RBC 5.79 05/06/2023 1611   RBC 5.84 (H) 04/01/2021 1756   HGB 16.4 05/06/2023 1611   HCT 48.5 05/06/2023 1611   PLT 291 05/06/2023 1611   MCV 84 05/06/2023 1611   MCH 28.3 05/06/2023 1611   MCH 28.8 04/01/2021 1756   MCHC 33.8 05/06/2023 1611   MCHC 33.8 04/01/2021 1756   RDW 13.4 05/06/2023 1611   Iron Studies No results found for: "IRON", "TIBC", "FERRITIN", "IRONPCTSAT" Lipid Panel     Component Value Date/Time   CHOL 161 05/06/2023 1611   TRIG 123 05/06/2023 1611   HDL 38 (L) 05/06/2023 1611   CHOLHDL 4.2 05/06/2023 1611   CHOLHDL 5.2 (H) 07/11/2020 1708    VLDL 29 03/06/2013 0844   LDLCALC 101 (H) 05/06/2023 1611   LDLCALC 147 (H) 07/11/2020 1708   Hepatic Function Panel     Component Value Date/Time   PROT 7.4 11/08/2023 1608   ALBUMIN 4.6 11/08/2023 1608   AST 15 11/08/2023 1608   ALT 18 11/08/2023 1608   ALKPHOS 77 11/08/2023 1608   BILITOT 0.3 11/08/2023 1608      Component Value Date/Time   TSH 1.280 11/08/2023 1608   Nutritional Lab Results  Component Value Date   VD25OH 54.4 05/06/2023   VD25OH 72.8 04/13/2022   VD25OH 56 04/01/2021     ASSESSMENT AND PLAN  TREATMENT PLAN FOR OBESITY:  Recommended Dietary Goals  Sergio [name] is currently in the action stage of change. As such, his goal is to continue weight management plan. He has agreed to the Category 3 Plan.  To start tracking and aim for 1500-1600 calories and 90+ grams of protein.  Will review macros at next visit.    Behavioral Intervention  We discussed the following Behavioral Modification Strategies today: increasing lean protein intake to established goals, decreasing simple carbohydrates , increasing vegetables, increasing fiber rich foods, increasing water  intake , work on meal planning and preparation, work on tracking and journaling calories using tracking application, and continue to work on maintaining a reduced calorie state, getting the recommended amount of protein, incorporating whole foods, making healthy choices, staying well hydrated and practicing mindfulness when eating..  Additional resources provided today: NA  Recommended Physical Activity Goals  Sergio [name] has been advised to work up to 150 minutes of moderate intensity aerobic activity a week and strengthening exercises 2-3 times per week for cardiovascular health, weight loss maintenance and preservation of muscle mass.   He has agreed to Think about enjoyable ways to increase daily physical activity and overcoming barriers to exercise, Increase physical activity in their day and reduce sedentary  time (increase NEAT)., and continue to gradually increase the amount and intensity of exercise routine   Pharmacotherapy We discussed various medication options to help Sergio [name] with his weight loss efforts and we both agreed to continue Contrave  2 lunch and add Metformin at breakfast for IR.  Side effects discussed.   Avoid Qsymia due to taking Zonegran    ASSOCIATED CONDITIONS ADDRESSED TODAY  Action/Plan  Insulin  resistance -     Insulin , random -     Hemoglobin A1c -     metFORMIN HCl; Take 1 tablet (500 mg total) by mouth daily with breakfast.  Dispense: 30 tablet; Refill: 0  Medication management -     Comprehensive metabolic panel with GFR  Class 1 obesity due to excess calories with serious  comorbidity and body mass index (BMI) of 32.0 to 32.9 in adult     To send me a message in 1-2 weeks to let me know how he is doing with the addition of Metformin  Has appt with PCP for CPE and labs in August   Return in about 4 weeks (around 03/21/2024).Aaron Aas He was informed of the importance of frequent follow up visits to maximize his success with intensive lifestyle modifications for his multiple health conditions.   ATTESTASTION STATEMENTS:  Reviewed by clinician on day of visit: allergies, medications, problem list, medical history, surgical history, family history, social history, and previous encounter notes.     Crist Dominion. Tydus Sanmiguel FNP-C

## 2024-02-24 LAB — COMPREHENSIVE METABOLIC PANEL WITH GFR
ALT: 31 IU/L (ref 0–44)
AST: 21 IU/L (ref 0–40)
Albumin: 4.4 g/dL (ref 4.1–5.1)
Alkaline Phosphatase: 77 IU/L (ref 44–121)
BUN/Creatinine Ratio: 12 (ref 9–20)
BUN: 13 mg/dL (ref 6–24)
Bilirubin Total: 0.4 mg/dL (ref 0.0–1.2)
CO2: 24 mmol/L (ref 20–29)
Calcium: 9.5 mg/dL (ref 8.7–10.2)
Chloride: 101 mmol/L (ref 96–106)
Creatinine, Ser: 1.05 mg/dL (ref 0.76–1.27)
Globulin, Total: 2.5 g/dL (ref 1.5–4.5)
Glucose: 89 mg/dL (ref 70–99)
Potassium: 4.3 mmol/L (ref 3.5–5.2)
Sodium: 140 mmol/L (ref 134–144)
Total Protein: 6.9 g/dL (ref 6.0–8.5)
eGFR: 86 mL/min/{1.73_m2} (ref >59)

## 2024-02-24 LAB — INSULIN, RANDOM: INSULIN: 12.1 u[IU]/mL (ref 2.6–24.9)

## 2024-02-24 LAB — HEMOGLOBIN A1C
Est. average glucose Bld gHb Est-mCnc: 117 mg/dL
Hgb A1c MFr Bld: 5.7 % — ABNORMAL HIGH (ref 4.8–5.6)

## 2024-03-09 DIAGNOSIS — M7061 Trochanteric bursitis, right hip: Secondary | ICD-10-CM | POA: Diagnosis not present

## 2024-03-09 DIAGNOSIS — M545 Low back pain, unspecified: Secondary | ICD-10-CM | POA: Diagnosis not present

## 2024-03-14 ENCOUNTER — Encounter: Payer: Self-pay | Admitting: Nurse Practitioner

## 2024-03-14 ENCOUNTER — Other Ambulatory Visit: Payer: Self-pay | Admitting: Allergy & Immunology

## 2024-03-14 ENCOUNTER — Other Ambulatory Visit (HOSPITAL_COMMUNITY): Payer: Self-pay

## 2024-03-14 ENCOUNTER — Other Ambulatory Visit: Payer: Self-pay | Admitting: Internal Medicine

## 2024-03-14 DIAGNOSIS — F411 Generalized anxiety disorder: Secondary | ICD-10-CM

## 2024-03-14 DIAGNOSIS — K219 Gastro-esophageal reflux disease without esophagitis: Secondary | ICD-10-CM

## 2024-03-14 MED ORDER — BUSPIRONE HCL 5 MG PO TABS
5.0000 mg | ORAL_TABLET | Freq: Two times a day (BID) | ORAL | 5 refills | Status: AC
  Filled 2024-03-14: qty 60, 30d supply, fill #0
  Filled 2024-04-10 – 2024-05-03 (×2): qty 60, 30d supply, fill #1
  Filled 2024-06-12: qty 60, 30d supply, fill #2
  Filled 2024-07-17: qty 60, 30d supply, fill #3
  Filled 2024-09-18: qty 60, 30d supply, fill #4

## 2024-03-14 MED ORDER — AZELASTINE HCL 137 MCG/SPRAY NA SOLN
NASAL | 0 refills | Status: DC
  Filled 2024-03-14: qty 30, 30d supply, fill #0

## 2024-03-14 MED ORDER — PANTOPRAZOLE SODIUM 40 MG PO TBEC
40.0000 mg | DELAYED_RELEASE_TABLET | Freq: Every day | ORAL | 1 refills | Status: DC
  Filled 2024-03-14: qty 90, 90d supply, fill #0
  Filled 2024-06-12: qty 90, 90d supply, fill #1

## 2024-03-15 ENCOUNTER — Other Ambulatory Visit (HOSPITAL_COMMUNITY): Payer: Self-pay

## 2024-03-15 ENCOUNTER — Other Ambulatory Visit: Payer: Self-pay

## 2024-03-28 ENCOUNTER — Other Ambulatory Visit: Payer: Self-pay

## 2024-03-28 ENCOUNTER — Other Ambulatory Visit: Payer: Self-pay | Admitting: Nurse Practitioner

## 2024-03-28 DIAGNOSIS — E88819 Insulin resistance, unspecified: Secondary | ICD-10-CM

## 2024-03-30 ENCOUNTER — Other Ambulatory Visit (HOSPITAL_COMMUNITY): Payer: Self-pay

## 2024-04-04 ENCOUNTER — Other Ambulatory Visit: Payer: Self-pay | Admitting: Nurse Practitioner

## 2024-04-04 ENCOUNTER — Other Ambulatory Visit (HOSPITAL_COMMUNITY): Payer: Self-pay

## 2024-04-04 DIAGNOSIS — E88819 Insulin resistance, unspecified: Secondary | ICD-10-CM

## 2024-04-04 MED ORDER — METFORMIN HCL 500 MG PO TABS
500.0000 mg | ORAL_TABLET | Freq: Every day | ORAL | 0 refills | Status: DC
Start: 2024-04-04 — End: 2024-04-27
  Filled 2024-04-04: qty 30, 30d supply, fill #0

## 2024-04-07 ENCOUNTER — Telehealth: Payer: Self-pay | Admitting: Allergy & Immunology

## 2024-04-07 NOTE — Telephone Encounter (Signed)
 Called patient to schedule [facility]  reapproval appointment. Voicemail was full.

## 2024-04-10 ENCOUNTER — Other Ambulatory Visit: Payer: Self-pay | Admitting: Allergy & Immunology

## 2024-04-10 ENCOUNTER — Other Ambulatory Visit (HOSPITAL_COMMUNITY): Payer: Self-pay

## 2024-04-10 ENCOUNTER — Encounter: Payer: Self-pay | Admitting: Internal Medicine

## 2024-04-10 ENCOUNTER — Other Ambulatory Visit: Payer: Self-pay

## 2024-04-10 MED ORDER — AZELASTINE HCL 137 MCG/SPRAY NA SOLN
NASAL | 0 refills | Status: DC
  Filled 2024-04-10 – 2024-05-03 (×2): qty 30, 30d supply, fill #0

## 2024-04-10 MED ORDER — FAMOTIDINE 40 MG PO TABS
40.0000 mg | ORAL_TABLET | Freq: Every day | ORAL | 3 refills | Status: AC
  Filled 2024-04-10 – 2024-05-03 (×2): qty 30, 30d supply, fill #0
  Filled 2024-06-12: qty 30, 30d supply, fill #1
  Filled 2024-07-17: qty 30, 30d supply, fill #2
  Filled 2024-09-18: qty 30, 30d supply, fill #3

## 2024-04-18 ENCOUNTER — Other Ambulatory Visit: Payer: Self-pay

## 2024-04-18 ENCOUNTER — Other Ambulatory Visit: Payer: Self-pay | Admitting: Pharmacy Technician

## 2024-04-18 NOTE — Progress Notes (Signed)
 Disenrolled; Last filled 12/01/23

## 2024-04-20 ENCOUNTER — Other Ambulatory Visit (HOSPITAL_COMMUNITY): Payer: Self-pay

## 2024-04-20 ENCOUNTER — Ambulatory Visit: Payer: Self-pay | Admitting: Family Medicine

## 2024-04-20 VITALS — BP 135/90 | HR 85 | Resp 16 | Ht 70.0 in | Wt 223.1 lb

## 2024-04-20 DIAGNOSIS — L989 Disorder of the skin and subcutaneous tissue, unspecified: Secondary | ICD-10-CM | POA: Diagnosis not present

## 2024-04-20 NOTE — Progress Notes (Signed)
   Sergio [name]     MRN: [id number]      DOB: [birth year]  Chief Complaint  Patient presents with   Mass    Pt complains of two spots on face that appeared a few weeks ago that have not gone away. Wants to know if he should be referred to dermatology.     HPI Sergio [name] is here 6 monhh/o itchy scaly enlarging rash on right cheek,  4 week h/o left forehead bump went away with topical application and returned ROS Denies recent fever or chills. Denies sinus pressure, nasal congestion, ear pain or sore throat. Denies chest congestion, productive cough or wheezing. No other close contact hs similar skin concerns/ rash PE  BP (!) 135/90   Pulse 85   Resp 16   Ht 5' 10 (1.778 m)   Wt 223 lb 1.3 oz (101.2 kg)   SpO2 98%   BMI 32.01 kg/m   Patient alert and oriented and in no cardiopulmonary distress.  HEENT: No facial asymmetry, EOMI,     Neck supple .  Chest: Clear to auscultation bilaterally.  CVS: S1, S2 no murmurs, no S3.Regular rate.  .  Skin: Intact, maculopapular rashes on face , rough surface .  Psych: Good eye contact, normal affect. CNS: CN 2-12 intact,  Assessment & Plan  Skin lesion 6 monht history, derm eval asap

## 2024-04-20 NOTE — Assessment & Plan Note (Signed)
 6 monht history, derm eval asap

## 2024-04-20 NOTE — Patient Instructions (Signed)
 Keep Appointment already scheduled wit PCP as before please  You  are referred to Dermatology for evaluation ( Dr Milford office in [place])  Thanks for choosing [facility], we consider it a privelige to serve you.

## 2024-04-24 DIAGNOSIS — M545 Low back pain, unspecified: Secondary | ICD-10-CM | POA: Diagnosis not present

## 2024-04-24 DIAGNOSIS — M7061 Trochanteric bursitis, right hip: Secondary | ICD-10-CM | POA: Diagnosis not present

## 2024-04-27 ENCOUNTER — Other Ambulatory Visit: Payer: Self-pay

## 2024-04-27 ENCOUNTER — Other Ambulatory Visit (HOSPITAL_BASED_OUTPATIENT_CLINIC_OR_DEPARTMENT_OTHER): Payer: Self-pay

## 2024-04-27 ENCOUNTER — Encounter: Payer: Self-pay | Admitting: Nurse Practitioner

## 2024-04-27 ENCOUNTER — Ambulatory Visit (INDEPENDENT_AMBULATORY_CARE_PROVIDER_SITE_OTHER): Admitting: Nurse Practitioner

## 2024-04-27 ENCOUNTER — Telehealth (HOSPITAL_COMMUNITY): Payer: Self-pay

## 2024-04-27 ENCOUNTER — Other Ambulatory Visit (HOSPITAL_COMMUNITY): Payer: Self-pay

## 2024-04-27 ENCOUNTER — Telehealth (HOSPITAL_COMMUNITY): Payer: Self-pay | Admitting: Pharmacy Technician

## 2024-04-27 ENCOUNTER — Encounter (HOSPITAL_COMMUNITY): Payer: Self-pay

## 2024-04-27 DIAGNOSIS — E88819 Insulin resistance, unspecified: Secondary | ICD-10-CM | POA: Diagnosis not present

## 2024-04-27 DIAGNOSIS — Z6832 Body mass index (BMI) 32.0-32.9, adult: Secondary | ICD-10-CM | POA: Diagnosis not present

## 2024-04-27 DIAGNOSIS — E66811 Obesity, class 1: Secondary | ICD-10-CM | POA: Diagnosis not present

## 2024-04-27 DIAGNOSIS — E6609 Other obesity due to excess calories: Secondary | ICD-10-CM

## 2024-04-27 MED ORDER — METFORMIN HCL 500 MG PO TABS
500.0000 mg | ORAL_TABLET | Freq: Every day | ORAL | 0 refills | Status: DC
  Filled 2024-04-27 – 2024-04-28 (×2): qty 30, 30d supply, fill #0

## 2024-04-27 MED ORDER — CONTRAVE 8-90 MG PO TB12
2.0000 | ORAL_TABLET | Freq: Two times a day (BID) | ORAL | 0 refills | Status: DC
  Filled 2024-04-27 – 2024-05-03 (×3): qty 120, 30d supply, fill #0

## 2024-04-27 NOTE — Telephone Encounter (Signed)
 Pharmacy Patient Advocate Encounter   Received notification from Pt Calls Messages that prior authorization for Contrave  8-90MG  er tablets  is required/requested.   Insurance verification completed.   The patient is insured through [facility] .   Per test claim: PA required; PA submitted to above mentioned insurance via Latent Key/confirmation #/EOC [id number] Status is pending

## 2024-04-27 NOTE — Telephone Encounter (Signed)
 PA request has been Received. New Encounter has been or will be created for follow up. For additional info see Pharmacy Prior Auth telephone encounter from 04/27/24.

## 2024-04-27 NOTE — Progress Notes (Signed)
 Office: [phone]  /  Fax: [phone]  WEIGHT SUMMARY AND BIOMETRICS  Weight Lost Since Last Visit: 8lb  Weight Gained Since Last Visit: 0lb   Vitals Temp: 97.8 F (36.6 C) BP: 114/81 Pulse Rate: 77 SpO2: 97 %   Anthropometric Measurements Height: 5' 10 (1.778 m) Weight: 218 lb (98.9 kg) BMI (Calculated): 31.28 Weight at Last Visit: 226lb Weight Lost Since Last Visit: 8lb Weight Gained Since Last Visit: 0lb Starting Weight: 226lb Total Weight Loss (lbs): 8 lb (3.629 kg)   Body Composition  Body Fat %: 29.1 % Fat Mass (lbs): 63.4 lbs Muscle Mass (lbs): 147 lbs Total Body Water  (lbs): 99 lbs Visceral Fat Rating : 14   Other Clinical Data Fasting: Yes Labs: No Today's Visit #: 8 Starting Date: 06/28/23     HPI  Chief Complaint: OBESITY  Sergio [name] is here to discuss his progress with his obesity treatment plan. He is on the the Category 3 Plan and states he is following his eating plan approximately 90 % of the time. He states he is exercising 30 minutes 5 days per week.   Interval History:  Since last office visit he has lost 8 pound.  He has been eating better and doing more exercises.   He is doing better concerning polyphagia.  Does have some cravings for sweets in the evening.  He is watching his portion sizes and making healthier choices.  He is drinking water  daily.  Denies sugary drinks.  He is going to rehab for right hip pain/back pain.  He is walking and doing the elliptical on work days.    He is going to beach the end of the month.   Pharmacotherapy for weight loss: He is currently taking Contrave  2 around noon for medical weight loss.  Denies side effects.     Unable to start Zepbound  due to cost.     Previous pharmacotherapy for medical weight loss:  None   Bariatric surgery:  Patient has not had bariatric surgery    Insulin  Resistance Last fasting insulin  was 12.1 (improved slightly). A1c was 5.7. Polyphagia:No Medication(s):  Metformin  500mg  breakfast (started after last visit(.  Denies side effects  Lab Results  Component Value Date   HGBA1C 5.7 (H) 02/22/2024   HGBA1C 5.6 05/06/2023   HGBA1C 5.4 04/13/2022   HGBA1C 5.3 04/01/2021   HGBA1C 5.5 11/13/2019   Lab Results  Component Value Date   INSULIN  12.1 02/22/2024   INSULIN  13.5 06/28/2023    PHYSICAL EXAM:  Blood pressure 114/81, pulse 77, temperature 97.8 F (36.6 C), height 5' 10 (1.778 m), weight 218 lb (98.9 kg), SpO2 97%. Body mass index is 31.28 kg/m.  General: He is overweight, cooperative, alert, well developed, and in no acute distress. PSYCH: Has normal mood, affect and thought process.   Extremities: No edema.  Neurologic: No gross sensory or motor deficits. No tremors or fasciculations noted.    DIAGNOSTIC DATA REVIEWED:  BMET    Component Value Date/Time   NA 140 02/22/2024 1229   K 4.3 02/22/2024 1229   CL 101 02/22/2024 1229   CO2 24 02/22/2024 1229   GLUCOSE 89 02/22/2024 1229   GLUCOSE 111 (H) 08/04/2021 0901   BUN 13 02/22/2024 1229   CREATININE 1.05 02/22/2024 1229   CREATININE 1.11 04/01/2021 1756   CALCIUM 9.5 02/22/2024 1229   GFRNONAA >60 08/04/2021 0901   GFRNONAA 94 07/11/2020 1708   GFRAA 109 07/11/2020 1708   Lab Results  Component Value Date  HGBA1C 5.7 (H) 02/22/2024   HGBA1C 5.5 11/13/2019   Lab Results  Component Value Date   INSULIN  12.1 02/22/2024   INSULIN  13.5 06/28/2023   Lab Results  Component Value Date   TSH 1.280 11/08/2023   CBC    Component Value Date/Time   WBC 8.3 05/06/2023 1611   WBC 12.5 (H) 04/01/2021 1756   RBC 5.79 05/06/2023 1611   RBC 5.84 (H) 04/01/2021 1756   HGB 16.4 05/06/2023 1611   HCT 48.5 05/06/2023 1611   PLT 291 05/06/2023 1611   MCV 84 05/06/2023 1611   MCH 28.3 05/06/2023 1611   MCH 28.8 04/01/2021 1756   MCHC 33.8 05/06/2023 1611   MCHC 33.8 04/01/2021 1756   RDW 13.4 05/06/2023 1611   Iron Studies No results found for: IRON, TIBC,  FERRITIN, IRONPCTSAT Lipid Panel     Component Value Date/Time   CHOL 161 05/06/2023 1611   TRIG 123 05/06/2023 1611   HDL 38 (L) 05/06/2023 1611   CHOLHDL 4.2 05/06/2023 1611   CHOLHDL 5.2 (H) 07/11/2020 1708   VLDL 29 03/06/2013 0844   LDLCALC 101 (H) 05/06/2023 1611   LDLCALC 147 (H) 07/11/2020 1708   Hepatic Function Panel     Component Value Date/Time   PROT 6.9 02/22/2024 1229   ALBUMIN 4.4 02/22/2024 1229   AST 21 02/22/2024 1229   ALT 31 02/22/2024 1229   ALKPHOS 77 02/22/2024 1229   BILITOT 0.4 02/22/2024 1229      Component Value Date/Time   TSH 1.280 11/08/2023 1608   Nutritional Lab Results  Component Value Date   VD25OH 54.4 05/06/2023   VD25OH 72.8 04/13/2022   VD25OH 56 04/01/2021     ASSESSMENT AND PLAN  TREATMENT PLAN FOR OBESITY:  Recommended Dietary Goals  Sergio [name] is currently in the action stage of change. As such, his goal is to continue weight management plan. He has agreed to the Category 2 Plan.  Behavioral Intervention  We discussed the following Behavioral Modification Strategies today: increasing lean protein intake to established goals, decreasing simple carbohydrates , increasing vegetables, increasing fiber rich foods, increasing water  intake , work on meal planning and preparation, reading food labels , keeping healthy foods at home, continue to practice mindfulness when eating, and continue to work on maintaining a reduced calorie state, getting the recommended amount of protein, incorporating whole foods, making healthy choices, staying well hydrated and practicing mindfulness when eating..  Additional resources provided today: NA  Recommended Physical Activity Goals  Sergio [name] has been advised to work up to 150 minutes of moderate intensity aerobic activity a week and strengthening exercises 2-3 times per week for cardiovascular health, weight loss maintenance and preservation of muscle mass.   He has agreed to Think about  enjoyable ways to increase daily physical activity and overcoming barriers to exercise, Increase physical activity in their day and reduce sedentary time (increase NEAT)., and continue to gradually increase the amount and intensity of exercise routine   Pharmacotherapy We discussed various medication options to help Ash with his weight loss efforts and we both agreed to continue Contrave  2 daily at lunch. Side effects discussed.  ASSOCIATED CONDITIONS ADDRESSED TODAY  Action/Plan  Insulin  resistance -     metFORMIN  HCl; Take 1 tablet (500 mg total) by mouth daily with breakfast.  Dispense: 30 tablet; Refill: 0. Side effects discussed  Can try taking at dinner to see if will help with evening cravings.  Would like to wait until after he gets back  from vacation.   Class 1 obesity due to excess calories with serious comorbidity and body mass index (BMI) of 32.0 to 32.9 in adult -     Contrave ; Take 2 tablets by mouth 2 (two) times daily.  Dispense: 120 tablet; Refill: 0     Labs reviewed in chart from 02/22/24    No follow-ups on file.SABRA He was informed of the importance of frequent follow up visits to maximize his success with intensive lifestyle modifications for his multiple health conditions.   ATTESTASTION STATEMENTS:  Reviewed by clinician on day of visit: allergies, medications, problem list, medical history, surgical history, family history, social history, and previous encounter notes.     Corean SAUNDERS. Der Gagliano FNP-C

## 2024-04-28 ENCOUNTER — Other Ambulatory Visit (HOSPITAL_COMMUNITY): Payer: Self-pay

## 2024-04-28 ENCOUNTER — Other Ambulatory Visit: Payer: Self-pay

## 2024-05-02 ENCOUNTER — Other Ambulatory Visit (HOSPITAL_COMMUNITY): Payer: Self-pay

## 2024-05-02 NOTE — Telephone Encounter (Signed)
 Pharmacy Patient Advocate Encounter  Received notification from [facility] that Prior Authorization for Contrave  8-90MG  er tablets has been APPROVED from 05/01/2024 to 06/30/2024   PA #/Case ID/Reference #: [id number]

## 2024-05-04 ENCOUNTER — Other Ambulatory Visit: Payer: Self-pay

## 2024-05-04 ENCOUNTER — Other Ambulatory Visit (HOSPITAL_COMMUNITY): Payer: Self-pay

## 2024-05-08 ENCOUNTER — Encounter: Payer: Commercial Managed Care - PPO | Admitting: Internal Medicine

## 2024-05-10 ENCOUNTER — Other Ambulatory Visit: Payer: Self-pay

## 2024-05-23 ENCOUNTER — Encounter: Payer: Self-pay | Admitting: Family Medicine

## 2024-05-31 ENCOUNTER — Other Ambulatory Visit: Payer: Self-pay

## 2024-05-31 ENCOUNTER — Other Ambulatory Visit (HOSPITAL_COMMUNITY): Payer: Self-pay

## 2024-05-31 ENCOUNTER — Encounter: Payer: Self-pay | Admitting: Internal Medicine

## 2024-05-31 ENCOUNTER — Ambulatory Visit (INDEPENDENT_AMBULATORY_CARE_PROVIDER_SITE_OTHER): Admitting: Internal Medicine

## 2024-05-31 VITALS — BP 130/92 | HR 100 | Ht 70.0 in | Wt 223.4 lb

## 2024-05-31 DIAGNOSIS — E039 Hypothyroidism, unspecified: Secondary | ICD-10-CM

## 2024-05-31 DIAGNOSIS — E559 Vitamin D deficiency, unspecified: Secondary | ICD-10-CM | POA: Diagnosis not present

## 2024-05-31 DIAGNOSIS — E782 Mixed hyperlipidemia: Secondary | ICD-10-CM | POA: Diagnosis not present

## 2024-05-31 DIAGNOSIS — M2550 Pain in unspecified joint: Secondary | ICD-10-CM

## 2024-05-31 DIAGNOSIS — G43919 Migraine, unspecified, intractable, without status migrainosus: Secondary | ICD-10-CM

## 2024-05-31 DIAGNOSIS — I10 Essential (primary) hypertension: Secondary | ICD-10-CM

## 2024-05-31 DIAGNOSIS — Z0001 Encounter for general adult medical examination with abnormal findings: Secondary | ICD-10-CM | POA: Diagnosis not present

## 2024-05-31 DIAGNOSIS — J4540 Moderate persistent asthma, uncomplicated: Secondary | ICD-10-CM

## 2024-05-31 DIAGNOSIS — K219 Gastro-esophageal reflux disease without esophagitis: Secondary | ICD-10-CM | POA: Diagnosis not present

## 2024-05-31 DIAGNOSIS — Z125 Encounter for screening for malignant neoplasm of prostate: Secondary | ICD-10-CM | POA: Diagnosis not present

## 2024-05-31 DIAGNOSIS — Z23 Encounter for immunization: Secondary | ICD-10-CM | POA: Diagnosis not present

## 2024-05-31 DIAGNOSIS — K449 Diaphragmatic hernia without obstruction or gangrene: Secondary | ICD-10-CM

## 2024-05-31 DIAGNOSIS — F411 Generalized anxiety disorder: Secondary | ICD-10-CM

## 2024-05-31 MED ORDER — SIMVASTATIN 40 MG PO TABS
40.0000 mg | ORAL_TABLET | Freq: Every day | ORAL | 1 refills | Status: AC
  Filled 2024-05-31: qty 90, 90d supply, fill #0
  Filled 2024-09-18: qty 90, 90d supply, fill #1

## 2024-05-31 MED ORDER — AMLODIPINE BESYLATE 2.5 MG PO TABS
2.5000 mg | ORAL_TABLET | Freq: Every day | ORAL | 1 refills | Status: AC
  Filled 2024-05-31: qty 90, 90d supply, fill #0
  Filled 2024-09-18: qty 90, 90d supply, fill #1

## 2024-05-31 MED ORDER — LOSARTAN POTASSIUM-HCTZ 100-25 MG PO TABS
1.0000 | ORAL_TABLET | Freq: Every day | ORAL | 1 refills | Status: AC
  Filled 2024-05-31: qty 90, 90d supply, fill #0
  Filled 2024-08-06 – 2024-08-22 (×3): qty 90, 90d supply, fill #1

## 2024-05-31 NOTE — Assessment & Plan Note (Addendum)
 Considering family history of rheumatoid arthritis, will check RF and ANA Planned to see [facility] for chronic low back and hip pain

## 2024-05-31 NOTE — Assessment & Plan Note (Addendum)
 Physical exam as documented. Counseling done  re healthy lifestyle involving commitment to 150 minutes exercise per week, heart healthy diet, and attaining healthy weight.The importance of adequate sleep also discussed. Immunization and cancer screening needs are specifically addressed at this visit.

## 2024-05-31 NOTE — Assessment & Plan Note (Signed)
Well controlled with zonisamide Followed by neurology

## 2024-05-31 NOTE — Assessment & Plan Note (Signed)
 BP Readings from Last 1 Encounters:  05/31/24 (!) 130/92   Uncontrolled with Hyzaar  Added Amlodipine  2.5 mg QD Counseled for compliance with the medications Advised DASH diet and moderate exercise/walking, at least 150 mins/week

## 2024-05-31 NOTE — Assessment & Plan Note (Signed)
 Well controlled with Breztri Uses azelastine spray for allergic rhinitis On Tezspire now On Zyrtec and Singulair for allergies Followed by allergy and immunology clinic

## 2024-05-31 NOTE — Assessment & Plan Note (Signed)
 Well controlled with pantoprazole 40 mg QAM and Pepcid 40 mg QPM

## 2024-05-31 NOTE — Progress Notes (Signed)
 Established Patient Office Visit  Subjective:  Patient ID: Sergio [name], male    DOB: [birth year]  Age: 48 y.o. MRN: [id number]  CC:  Sergio Complaint  Patient presents with   Annual Exam    Cpe    Hypertension    Concerns about bp , bottom number has been elevated at times.     HPI Sergio [name] is a 48 y.o. male with past medical history of HTN, migraine, asthma, OSA, GERD, IBS and HLD who presents for f/u of his chronic medical conditions.  HTN: BP is elevated today.  He has noticed elevated diastolic BP at home as well.  Takes losartan -hydrochlorothiazide  100-25 mg QD regularly. Patient denies headache, dizziness, chest pain, dyspnea or palpitations.  He also takes Zocor  for HLD.  Asthma and allergic rhinitis: He uses Breztri  and as needed Xopenex  for asthma.  Denies any dyspnea or wheezing currently.  He uses azelastine  nasal spray as needed for allergies.  He also takes Zyrtec  and Singulair  for allergies.  He is on Tezspire  now.   He has a history of migraine, for which he takes zonisamide .  He follows up with neurology.  He uses CPAP for OSA.  Denies any major discomfort currently.  Obesity: His weight has been stable despite trying low-carb diet and exercise at times.  He sees healthy weight and wellness clinic.  Polyarthralgia: He reports chronic low back pain, right hip pain and bilateral hand stiffness.  He has had steroid injection in the hip without much relief.  He is going to see Sergio [name] for low back pain.  He also reports family history of rheumatoid arthritis in his grandmother.   Past Medical History:  Diagnosis Date   Allergy     Anxiety    Asthma with allergic rhinitis 03/11/2008   Constipation    Essential hypertension 05/06/2012   GAD (generalized anxiety disorder) 04/13/2022   Gastroesophageal reflux disease with hiatal hernia 05/06/2012   Gastroesophageal reflux disease with hiatal hernia    Hyperlipidemia LDL goal < 160 03/11/2008    Hypothyroidism 10/13/2021   Irritable bowel syndrome with constipation and diarrhea 05/06/2012   Joint pain    Joint pain    Migraine    Obesity (BMI 30.0-34.9) 03/03/2013   Obstructive sleep apnea 11/19/2008   Nocturnal polysomnography 12/14/2008: AHI 20.8, O2 Sat nadir 84%, optimal CPAP 12 cm H2O    Palpitations    Pre-diabetes    Sleep apnea    Swelling of lower extremity    Urticaria    Vitamin D  deficiency     Past Surgical History:  Procedure Laterality Date   COLONOSCOPY WITH PROPOFOL  N/A 08/06/2021   Procedure: COLONOSCOPY WITH PROPOFOL ;  Surgeon: Eartha Angelia Sieving, MD;  Location: [facility];  Service: Gastroenterology;  Laterality: N/A;  915   POLYPECTOMY  08/06/2021   Procedure: POLYPECTOMY;  Surgeon: Eartha Angelia Sieving, MD;  Location: [facility];  Service: Gastroenterology;;  cecum and ascending   URETHRAL DILATION      Family History  Problem Relation Age of Onset   Anxiety disorder Mother    Arthritis Mother    Early death Father        Accident   Healthy Sister    Healthy Sister    Hypertension Maternal Grandmother    Vaginal cancer Maternal Grandmother    Parkinson's disease Maternal Grandmother    Anxiety disorder Maternal Grandmother    Arthritis Maternal Grandmother    COPD Maternal Grandfather  Breast cancer Paternal Grandmother    Kidney cancer Paternal Grandmother    Diabetes Paternal Grandfather    ALS Paternal Grandfather    Hypertension Maternal Uncle    Diabetes Paternal Aunt    Asthma Paternal Aunt    Urticaria Neg Hx    Immunodeficiency Neg Hx    Eczema Neg Hx    Atopy Neg Hx    Angioedema Neg Hx    Allergic rhinitis Neg Hx     Social History   Socioeconomic History   Marital status: Married    Spouse name: Not on file   Number of children: 0   Years of education: college   Highest education level: Associate degree: academic program  Occupational History   Occupation: Medical Records  Tobacco Use    Smoking status: Never   Smokeless tobacco: Never  Vaping Use   Vaping status: Never Used  Substance and Sexual Activity   Alcohol use: Yes    Comment: occ   Drug use: No   Sexual activity: Yes    Birth control/protection: None  Other Topics Concern   Not on file  Social History Narrative   Works for [employer] in the [employer].  Married for 23 years, no children.   Right-handed.   Occasionally caffeine use.   Social Drivers of Corporate investment banker Strain: Low Risk  (04/20/2024)   Overall Financial Resource Strain (CARDIA)    Difficulty of Paying Living Expenses: Not hard at all  Food Insecurity: No Food Insecurity (04/20/2024)   Hunger Vital Sign    Worried About Running Out of Food in the Last Year: Never true    Ran Out of Food in the Last Year: Never true  Transportation Needs: No Transportation Needs (04/20/2024)   PRAPARE - Administrator, Civil Service (Medical): No    Lack of Transportation (Non-Medical): No  Physical Activity: Sufficiently Active (04/20/2024)   Exercise Vital Sign    Days of Exercise per Week: 5 days    Minutes of Exercise per Session: 30 min  Stress: No Stress Concern Present (04/20/2024)   [employer] of Occupational Health - Occupational Stress Questionnaire    Feeling of Stress: Only a little  Social Connections: Socially Integrated (04/20/2024)   Social Connection and Isolation Panel    Frequency of Communication with Friends and Family: More than three times a week    Frequency of Social Gatherings with Friends and Family: Twice a week    Attends Religious Services: More than 4 times per year    Active Member of [employer] or Organizations: Yes    Attends Engineer, structural: More than 4 times per year    Marital Status: Married  Catering manager Violence: Not on file    Outpatient Medications Prior to Visit  Medication Sig Dispense Refill   Azelastine  HCl 137 MCG/SPRAY SOLN Use 2 sprays into both nostrils twice  daily 30 mL 0   Budeson-Glycopyrrol-Formoterol  (BREZTRI  AEROSPHERE) 160-9-4.8 MCG/ACT AERO Inhale 2 puffs into the lungs in the morning and at bedtime. 10.7 g 5   busPIRone  (BUSPAR ) 5 MG tablet Take 1 tablet by mouth 2 times daily. 60 tablet 5   cetirizine  (ZYRTEC ) 10 MG tablet Take 1 tablet (10 mg total) by mouth daily. 90 tablet 1   Cholecalciferol (VITAMIN D3) 125 MCG (5000 UT) TABS Take 5,000 Units by mouth daily.     diphenhydrAMINE (BENADRYL) 25 MG tablet Take 50 mg by mouth daily as needed for  itching.     EPINEPHrine  0.3 mg/0.3 mL IJ SOAJ injection Inject 0.3 mg into the muscle as needed for anaphylaxis. 2 each 1   famotidine  (PEPCID ) 40 MG tablet Take 1 tablet (40 mg total) by mouth daily. 30 tablet 3   fluticasone  (FLONASE ) 50 MCG/ACT nasal spray Place 2 sprays into both nostrils daily. 16 g 0   hyoscyamine  (LEVSIN ) 0.125 MG tablet Take 1 tablet (0.125 mg total) by mouth every 6 (six) hours as needed for abdominal pain. 30 tablet 3   levalbuterol  (XOPENEX  HFA) 45 MCG/ACT inhaler Inhale 4 puffs into the lungs every 4 to 6 hours as needed. 15 g 1   metFORMIN  (GLUCOPHAGE ) 500 MG tablet Take 1 tablet (500 mg total) by mouth daily with breakfast. 30 tablet 0   montelukast  (SINGULAIR ) 10 MG tablet Take 1 tablet (10 mg total) by mouth daily. 90 tablet 1   Naltrexone -buPROPion  HCl ER (CONTRAVE ) 8-90 MG TB12 Take 2 tablets by mouth 2 (two) times daily. 120 tablet 0   pantoprazole  (PROTONIX ) 40 MG tablet Take 1 tablet (40 mg total) by mouth daily. 90 tablet 1   tezepelumab -ekko (TEZSPIRE ) 210 MG/1. syringe Inject 1.91 mLs (210 mg total) into the skin every 28 (twenty-eight) days. 1.91 mL 11   thyroid  (NP THYROID ) 90 MG tablet Take 1 tablet (90 mg total) by mouth daily. 90 tablet 1   zonisamide  (ZONEGRAN ) 100 MG capsule Take 1 capsule (100 mg total) by mouth daily. 30 capsule 2   losartan -hydrochlorothiazide  (HYZAAR ) 100-25 MG tablet Take 1 tablet by mouth daily. 90 tablet 1    methylPREDNISolone  (MEDROL ) 4 MG TBPK tablet Take as directed on package for 6 days for pain. 21 tablet 0   simvastatin  (ZOCOR ) 40 MG tablet Take 1 tablet (40 mg total) by mouth at bedtime. 90 tablet 1   Facility-Administered Medications Prior to Visit  Medication Dose Route Frequency Provider Last Rate Last Admin   tezepelumab -ekko (TEZSPIRE ) 210 MG/1. syringe 210 mg  210 mg Subcutaneous Q28 days Marinda Rocky SAILOR, MD   210 mg at 10/28/23 1608    Allergies  Allergen Reactions   Other Hives and Swelling    Cashews     ROS Review of Systems  Constitutional:  Negative for chills and fever.  HENT:  Positive for congestion, postnasal drip and sinus pressure. Negative for sore throat.   Eyes:  Negative for pain and discharge.  Respiratory:  Negative for cough and shortness of breath.   Cardiovascular:  Negative for chest pain and palpitations.  Gastrointestinal:  Positive for constipation. Negative for nausea and vomiting.  Endocrine: Negative for polydipsia and polyuria.  Genitourinary:  Negative for dysuria and hematuria.  Musculoskeletal:  Positive for arthralgias and back pain. Negative for neck pain and neck stiffness.  Skin:  Negative for rash.  Allergic/Immunologic: Positive for environmental allergies.  Neurological:  Positive for headaches. Negative for dizziness, weakness and numbness.  Psychiatric/Behavioral:  Negative for agitation and behavioral problems.       Objective:    Physical Exam Vitals reviewed.  Constitutional:      General: He is not in acute distress.    Appearance: He is obese. He is not diaphoretic.  HENT:     Head: Normocephalic and atraumatic.     Nose: Congestion present.     Right Sinus: Frontal sinus tenderness present.     Left Sinus: No frontal sinus tenderness.     Mouth/Throat:     Lips: Lesions (vesicle below lower lip) present.  Mouth: Mucous membranes are moist.  Eyes:     General: No scleral icterus.    Extraocular Movements:  Extraocular movements intact.  Cardiovascular:     Rate and Rhythm: Normal rate and regular rhythm.     Heart sounds: Normal heart sounds. No murmur heard. Pulmonary:     Breath sounds: Normal breath sounds. No wheezing or rales.  Abdominal:     Palpations: Abdomen is soft.     Tenderness: There is no abdominal tenderness.  Musculoskeletal:     Cervical back: Neck supple. No tenderness.     Lumbar back: Tenderness present. Normal range of motion.     Right hip: Tenderness present. Normal range of motion.     Left hip: No tenderness. Normal range of motion.     Right lower leg: No edema.     Left lower leg: No edema.  Skin:    General: Skin is warm.     Findings: No rash.  Neurological:     General: No focal deficit present.     Mental Status: He is alert and oriented to person, place, and time.     Sensory: No sensory deficit.     Motor: No weakness.  Psychiatric:        Mood and Affect: Mood normal.        Behavior: Behavior normal.     BP (!) 130/92 (BP Location: Right Arm)   Pulse 100   Ht 5' 10 (1.778 m)   Wt 223 lb 6.4 oz (101.3 kg)   SpO2 96%   BMI 32.05 kg/m  Wt Readings from Last 3 Encounters:  05/31/24 223 lb 6.4 oz (101.3 kg)  04/27/24 218 lb (98.9 kg)  04/20/24 223 lb 1.3 oz (101.2 kg)    Lab Results  Component Value Date   TSH 1.280 11/08/2023   Lab Results  Component Value Date   WBC 8.3 05/06/2023   HGB 16.4 05/06/2023   HCT 48.5 05/06/2023   MCV 84 05/06/2023   PLT 291 05/06/2023   Lab Results  Component Value Date   NA 140 02/22/2024   K 4.3 02/22/2024   CO2 24 02/22/2024   GLUCOSE 89 02/22/2024   BUN 13 02/22/2024   CREATININE 1.05 02/22/2024   BILITOT 0.4 02/22/2024   ALKPHOS 77 02/22/2024   AST 21 02/22/2024   ALT 31 02/22/2024   PROT 6.9 02/22/2024   ALBUMIN 4.4 02/22/2024   CALCIUM 9.5 02/22/2024   ANIONGAP 7 08/04/2021   EGFR 86 02/22/2024   Lab Results  Component Value Date   CHOL 161 05/06/2023   Lab Results   Component Value Date   HDL 38 (L) 05/06/2023   Lab Results  Component Value Date   LDLCALC 101 (H) 05/06/2023   Lab Results  Component Value Date   TRIG 123 05/06/2023   Lab Results  Component Value Date   CHOLHDL 4.2 05/06/2023   Lab Results  Component Value Date   HGBA1C 5.7 (H) 02/22/2024      Assessment & Plan:   Problem List Items Addressed This Visit       Cardiovascular and Mediastinum   Essential hypertension (Chronic)   BP Readings from Last 1 Encounters:  05/31/24 (!) 130/92   Uncontrolled with Hyzaar  Added Amlodipine  2.5 mg QD Counseled for compliance with the medications Advised DASH diet and moderate exercise/walking, at least 150 mins/week      Relevant Medications   amLODipine  (NORVASC ) 2.5 MG tablet   losartan -hydrochlorothiazide  (HYZAAR ) 100-25  MG tablet   simvastatin  (ZOCOR ) 40 MG tablet   Other Relevant Orders   CBC with Differential/Platelet   Migraine   Well controlled with zonisamide  Followed by neurology      Relevant Medications   amLODipine  (NORVASC ) 2.5 MG tablet   losartan -hydrochlorothiazide  (HYZAAR ) 100-25 MG tablet   simvastatin  (ZOCOR ) 40 MG tablet   Other Relevant Orders   CBC with Differential/Platelet     Respiratory   Asthma with allergic rhinitis (Chronic)   Well controlled with Breztri  Uses azelastine  spray for allergic rhinitis On Tezspire  now On Zyrtec  and Singulair  for allergies Followed by allergy  and immunology clinic      Gastroesophageal reflux disease with hiatal hernia (Chronic)   Well controlled with pantoprazole  40 mg QAM and Pepcid  40 mg QPM        Endocrine   Hypothyroidism   Lab Results  Component Value Date   TSH 1.280 11/08/2023   On NP thyroid  90 mg daily Will continue for now Check TSH and free T4      Relevant Orders   TSH + free T4     Other   Hyperlipidemia (Chronic)   On statin Checked lipid profile      Relevant Medications   amLODipine  (NORVASC ) 2.5 MG tablet    losartan -hydrochlorothiazide  (HYZAAR ) 100-25 MG tablet   simvastatin  (ZOCOR ) 40 MG tablet   Other Relevant Orders   Lipid panel   GAD (generalized anxiety disorder)   Well-controlled with Buspar  5 mg BID PRN      Encounter for general adult medical examination with abnormal findings - Primary   Physical exam as documented. Counseling done  re healthy lifestyle involving commitment to 150 minutes exercise per week, heart healthy diet, and attaining healthy weight.The importance of adequate sleep also discussed. Immunization and cancer screening needs are specifically addressed at this visit.      Vitamin D  deficiency   Relevant Orders   VITAMIN D  25 Hydroxy (Vit-D Deficiency, Fractures)   Polyarthralgia   Considering family history of rheumatoid arthritis, will check RF and ANA Planned to see [name] for chronic low back and hip pain      Relevant Orders   Rheumatoid Factor   ANA w/Reflex   Prostate cancer screening   Ordered PSA after discussing its limitations for prostate cancer screening, including false positive results leading to additional investigations.      Relevant Orders   PSA   Other Visit Diagnoses       Encounter for immunization       Relevant Orders   Pneumococcal conjugate vaccine 20-valent (Completed)        Meds ordered this encounter  Medications   amLODipine  (NORVASC ) 2.5 MG tablet    Sig: Take 1 tablet (2.5 mg total) by mouth daily.    Dispense:  90 tablet    Refill:  1   losartan -hydrochlorothiazide  (HYZAAR ) 100-25 MG tablet    Sig: Take 1 tablet by mouth daily.    Dispense:  90 tablet    Refill:  1   simvastatin  (ZOCOR ) 40 MG tablet    Sig: Take 1 tablet (40 mg total) by mouth at bedtime.    Dispense:  90 tablet    Refill:  1    Follow-up: Return in about 6 months (around 11/28/2024) for HTN.    Suzzane MARLA Blanch, MD

## 2024-05-31 NOTE — Assessment & Plan Note (Signed)
 Lab Results  Component Value Date   TSH 1.280 11/08/2023   On NP thyroid  90 mg daily Will continue for now Check TSH and free T4

## 2024-05-31 NOTE — Assessment & Plan Note (Signed)
 Well-controlled with Buspar 5 mg BID PRN

## 2024-05-31 NOTE — Assessment & Plan Note (Signed)
 Ordered PSA after discussing its limitations for prostate cancer screening, including false positive results leading to additional investigations.

## 2024-05-31 NOTE — Patient Instructions (Addendum)
 Please start taking Amlodipine  as prescribed.  Please continue to take other medications as prescribed.  Please continue to follow low carb diet and perform moderate exercise/walking at least 150 mins/week.

## 2024-05-31 NOTE — Assessment & Plan Note (Signed)
On statin Checked lipid profile 

## 2024-06-01 ENCOUNTER — Ambulatory Visit: Payer: Self-pay | Admitting: Internal Medicine

## 2024-06-01 LAB — TSH+FREE T4
Free T4: 1.08 ng/dL (ref 0.82–1.77)
TSH: 1.51 u[IU]/mL (ref 0.450–4.500)

## 2024-06-01 LAB — LIPID PANEL
Chol/HDL Ratio: 4.3 ratio (ref 0.0–5.0)
Cholesterol, Total: 210 mg/dL — ABNORMAL HIGH (ref 100–199)
HDL: 49 mg/dL (ref >39)
LDL Chol Calc (NIH): 135 mg/dL — ABNORMAL HIGH (ref 0–99)
Triglycerides: 148 mg/dL (ref 0–149)
VLDL Cholesterol Cal: 26 mg/dL (ref 5–40)

## 2024-06-01 LAB — CBC WITH DIFFERENTIAL/PLATELET
Basophils Absolute: 0.1 x10E3/uL (ref 0.0–0.2)
Basos: 1 %
EOS (ABSOLUTE): 0.1 x10E3/uL (ref 0.0–0.4)
Eos: 1 %
Hematocrit: 55.5 % — ABNORMAL HIGH (ref 37.5–51.0)
Hemoglobin: 18.1 g/dL — ABNORMAL HIGH (ref 13.0–17.7)
Immature Grans (Abs): 0.1 x10E3/uL (ref 0.0–0.1)
Immature Granulocytes: 1 %
Lymphocytes Absolute: 2.6 x10E3/uL (ref 0.7–3.1)
Lymphs: 27 %
MCH: 28.7 pg (ref 26.6–33.0)
MCHC: 32.6 g/dL (ref 31.5–35.7)
MCV: 88 fL (ref 79–97)
Monocytes Absolute: 0.8 x10E3/uL (ref 0.1–0.9)
Monocytes: 9 %
Neutrophils Absolute: 6.1 x10E3/uL (ref 1.4–7.0)
Neutrophils: 61 %
Platelets: 320 x10E3/uL (ref 150–450)
RBC: 6.31 x10E6/uL — ABNORMAL HIGH (ref 4.14–5.80)
RDW: 13.5 % (ref 11.6–15.4)
WBC: 9.8 x10E3/uL (ref 3.4–10.8)

## 2024-06-01 LAB — VITAMIN D 25 HYDROXY (VIT D DEFICIENCY, FRACTURES): Vit D, 25-Hydroxy: 34.5 ng/mL (ref 30.0–100.0)

## 2024-06-01 LAB — PSA: Prostate Specific Ag, Serum: 0.8 ng/mL (ref 0.0–4.0)

## 2024-06-02 LAB — RHEUMATOID FACTOR: Rheumatoid fact SerPl-aCnc: <10 [IU]/mL (ref <14.0)

## 2024-06-02 LAB — ANA W/REFLEX: Anti Nuclear Antibody (ANA): NEGATIVE

## 2024-06-06 ENCOUNTER — Other Ambulatory Visit (HOSPITAL_COMMUNITY): Payer: Self-pay

## 2024-06-06 DIAGNOSIS — M25551 Pain in right hip: Secondary | ICD-10-CM | POA: Diagnosis not present

## 2024-06-06 DIAGNOSIS — M5416 Radiculopathy, lumbar region: Secondary | ICD-10-CM | POA: Diagnosis not present

## 2024-06-06 MED ORDER — PREDNISONE 5 MG (48) PO TBPK
5.0000 mg | ORAL_TABLET | ORAL | 0 refills | Status: DC
  Filled 2024-06-06: qty 48, 12d supply, fill #0

## 2024-06-12 ENCOUNTER — Other Ambulatory Visit (HOSPITAL_COMMUNITY): Payer: Self-pay

## 2024-06-12 ENCOUNTER — Other Ambulatory Visit: Payer: Self-pay

## 2024-06-12 ENCOUNTER — Other Ambulatory Visit: Payer: Self-pay | Admitting: Nurse Practitioner

## 2024-06-12 ENCOUNTER — Other Ambulatory Visit: Payer: Self-pay | Admitting: Internal Medicine

## 2024-06-12 ENCOUNTER — Other Ambulatory Visit: Payer: Self-pay | Admitting: Allergy & Immunology

## 2024-06-12 DIAGNOSIS — E88819 Insulin resistance, unspecified: Secondary | ICD-10-CM

## 2024-06-12 DIAGNOSIS — J3089 Other allergic rhinitis: Secondary | ICD-10-CM

## 2024-06-12 DIAGNOSIS — J309 Allergic rhinitis, unspecified: Secondary | ICD-10-CM

## 2024-06-12 DIAGNOSIS — E039 Hypothyroidism, unspecified: Secondary | ICD-10-CM

## 2024-06-12 MED ORDER — MONTELUKAST SODIUM 10 MG PO TABS
10.0000 mg | ORAL_TABLET | Freq: Every day | ORAL | 1 refills | Status: AC
  Filled 2024-06-12: qty 90, 90d supply, fill #0
  Filled 2024-09-18: qty 90, 90d supply, fill #1

## 2024-06-12 MED ORDER — AZELASTINE HCL 137 MCG/SPRAY NA SOLN
NASAL | 0 refills | Status: DC
  Filled 2024-06-12: qty 30, 30d supply, fill #0

## 2024-06-12 MED ORDER — METFORMIN HCL 500 MG PO TABS
500.0000 mg | ORAL_TABLET | Freq: Every day | ORAL | 0 refills | Status: DC
  Filled 2024-06-12: qty 30, 30d supply, fill #0

## 2024-06-12 MED ORDER — THYROID 90 MG PO TABS
90.0000 mg | ORAL_TABLET | Freq: Every day | ORAL | 1 refills | Status: AC
  Filled 2024-06-12: qty 90, 90d supply, fill #0
  Filled 2024-09-18: qty 90, 90d supply, fill #1

## 2024-06-12 MED ORDER — CETIRIZINE HCL 10 MG PO TABS
10.0000 mg | ORAL_TABLET | Freq: Every day | ORAL | 1 refills | Status: AC
  Filled 2024-06-12: qty 90, 90d supply, fill #0
  Filled 2024-09-18: qty 90, 90d supply, fill #1

## 2024-06-13 DIAGNOSIS — M545 Low back pain, unspecified: Secondary | ICD-10-CM | POA: Diagnosis not present

## 2024-06-15 ENCOUNTER — Other Ambulatory Visit (HOSPITAL_COMMUNITY): Payer: Self-pay

## 2024-06-27 DIAGNOSIS — M25551 Pain in right hip: Secondary | ICD-10-CM | POA: Diagnosis not present

## 2024-06-30 DIAGNOSIS — M25551 Pain in right hip: Secondary | ICD-10-CM | POA: Diagnosis not present

## 2024-07-05 ENCOUNTER — Ambulatory Visit: Admitting: Nurse Practitioner

## 2024-07-17 ENCOUNTER — Other Ambulatory Visit: Payer: Self-pay | Admitting: Nurse Practitioner

## 2024-07-17 DIAGNOSIS — E88819 Insulin resistance, unspecified: Secondary | ICD-10-CM

## 2024-07-18 ENCOUNTER — Other Ambulatory Visit: Payer: Self-pay

## 2024-07-24 ENCOUNTER — Ambulatory Visit: Admitting: Nurse Practitioner

## 2024-07-31 ENCOUNTER — Encounter: Payer: Self-pay | Admitting: Nurse Practitioner

## 2024-08-06 ENCOUNTER — Other Ambulatory Visit: Payer: Self-pay | Admitting: Nurse Practitioner

## 2024-08-06 DIAGNOSIS — E88819 Insulin resistance, unspecified: Secondary | ICD-10-CM

## 2024-08-07 ENCOUNTER — Other Ambulatory Visit (HOSPITAL_COMMUNITY): Payer: Self-pay

## 2024-08-07 ENCOUNTER — Other Ambulatory Visit: Payer: Self-pay

## 2024-08-08 ENCOUNTER — Other Ambulatory Visit (HOSPITAL_COMMUNITY): Payer: Self-pay

## 2024-08-16 ENCOUNTER — Ambulatory Visit: Admitting: Nurse Practitioner

## 2024-08-20 ENCOUNTER — Other Ambulatory Visit (HOSPITAL_COMMUNITY): Payer: Self-pay

## 2024-08-23 ENCOUNTER — Encounter: Payer: Self-pay | Admitting: Nurse Practitioner

## 2024-08-23 ENCOUNTER — Telehealth (HOSPITAL_COMMUNITY): Payer: Self-pay | Admitting: Pharmacy Technician

## 2024-08-23 ENCOUNTER — Encounter (HOSPITAL_COMMUNITY): Payer: Self-pay

## 2024-08-23 ENCOUNTER — Other Ambulatory Visit (HOSPITAL_COMMUNITY): Payer: Self-pay

## 2024-08-23 ENCOUNTER — Ambulatory Visit: Admitting: Nurse Practitioner

## 2024-08-23 VITALS — BP 130/80 | HR 78 | Temp 97.8°F | Ht 70.0 in | Wt 221.0 lb

## 2024-08-23 DIAGNOSIS — R7303 Prediabetes: Secondary | ICD-10-CM

## 2024-08-23 DIAGNOSIS — Z6831 Body mass index (BMI) 31.0-31.9, adult: Secondary | ICD-10-CM | POA: Diagnosis not present

## 2024-08-23 DIAGNOSIS — E669 Obesity, unspecified: Secondary | ICD-10-CM

## 2024-08-23 DIAGNOSIS — E88819 Insulin resistance, unspecified: Secondary | ICD-10-CM

## 2024-08-23 MED ORDER — CONTRAVE 8-90 MG PO TB12
2.0000 | ORAL_TABLET | Freq: Two times a day (BID) | ORAL | 0 refills | Status: AC
  Filled 2024-08-23: qty 120, 30d supply, fill #0

## 2024-08-23 MED ORDER — METFORMIN HCL 500 MG PO TABS
500.0000 mg | ORAL_TABLET | Freq: Two times a day (BID) | ORAL | 0 refills | Status: AC
  Filled 2024-08-23: qty 60, 30d supply, fill #0

## 2024-08-23 NOTE — Progress Notes (Signed)
 Office: [phone]  /  Fax: [phone]  WEIGHT SUMMARY AND BIOMETRICS  Weight Lost Since Last Visit: 0lb  Weight Gained Since Last Visit: 3lb   Vitals Temp: 97.8 F (36.6 C) BP: 130/80 Pulse Rate: 78 SpO2: 97 %   Anthropometric Measurements Height: 5' 10 (1.778 m) Weight: 221 lb (100.2 kg) BMI (Calculated): 31.71 Weight at Last Visit: 218lb Weight Lost Since Last Visit: 0lb Weight Gained Since Last Visit: 3lb Starting Weight: 226lb Total Weight Loss (lbs): 5 lb (2.268 kg)   Body Composition  Body Fat %: 30.5 % Fat Mass (lbs): 67.6 lbs Muscle Mass (lbs): 146.4 lbs Total Body Water  (lbs): 102.4 lbs Visceral Fat Rating : 15   Other Clinical Data Fasting: Yes Labs: No Today's Visit #: 9 Starting Date: 06/28/23     HPI  Chief Complaint: OBESITY  Sergio [name] is here to discuss his progress with his obesity treatment plan. He is on the the Category 3 Plan and states he is following his eating plan approximately 60 % of the time. He states he is exercising 0 minutes 0 days per week.   Interval History:  Since last office visit he has gained 3 pounds. He notes he has gotten off track due to pain.  He is struggling with hip pain and recently saw ortho.  He recently had an injection under fluro with minimal relief.  MRI showed arthritis and is considering surgery.  He has a follow up appt with ortho in January.    Pharmacotherapy for weight loss: He is currently taking Contrave  2 around noon for medical weight loss.  Denies side effects.  He stopped Contrave  for one month since his last visit because he got off track due to his hip pain.  He restarted taking it back in November.  He is struggling with polyphagia/cravings especially in the evening.     Previous pharmacotherapy for medical weight loss:  None   Bariatric surgery:  Patient has not had bariatric surgery   Insulin  Resistance Last fasting insulin  was 12.1. A1c was 5.7. Polyphagia:Yes Medication(s):  none, stopped Metformin  1 month ago.  Denied side effects while taking it.   Notes increase in polyphagia and cravings since stopping it especially sweets.  Requesting to restart taking it again.    Lab Results  Component Value Date   HGBA1C 5.7 (H) 02/22/2024   HGBA1C 5.6 05/06/2023   HGBA1C 5.4 04/13/2022   HGBA1C 5.3 04/01/2021   HGBA1C 5.5 11/13/2019   Lab Results  Component Value Date   INSULIN  12.1 02/22/2024   INSULIN  13.5 06/28/2023    PHYSICAL EXAM:  Blood pressure 130/80, pulse 78, temperature 97.8 F (36.6 C), height 5' 10 (1.778 m), weight 221 lb (100.2 kg), SpO2 97%. Body mass index is 31.71 kg/m.  General: He is overweight, cooperative, alert, well developed, and in no acute distress. PSYCH: Has normal mood, affect and thought process.   Extremities: No edema.  Neurologic: No gross sensory or motor deficits. No tremors or fasciculations noted.    DIAGNOSTIC DATA REVIEWED:  BMET    Component Value Date/Time   NA 140 02/22/2024 1229   K 4.3 02/22/2024 1229   CL 101 02/22/2024 1229   CO2 24 02/22/2024 1229   GLUCOSE 89 02/22/2024 1229   GLUCOSE 111 (H) 08/04/2021 0901   BUN 13 02/22/2024 1229   CREATININE 1.05 02/22/2024 1229   CREATININE 1.11 04/01/2021 1756   CALCIUM 9.5 02/22/2024 1229   GFRNONAA >60 08/04/2021 0901   GFRNONAA 94  07/11/2020 1708   GFRAA 109 07/11/2020 1708   Lab Results  Component Value Date   HGBA1C 5.7 (H) 02/22/2024   HGBA1C 5.5 11/13/2019   Lab Results  Component Value Date   INSULIN  12.1 02/22/2024   INSULIN  13.5 06/28/2023   Lab Results  Component Value Date   TSH 1.510 05/31/2024   CBC    Component Value Date/Time   WBC 9.8 05/31/2024 0925   WBC 12.5 (H) 04/01/2021 1756   RBC 6.31 (H) 05/31/2024 0925   RBC 5.84 (H) 04/01/2021 1756   HGB 18.1 (H) 05/31/2024 0925   HCT 55.5 (H) 05/31/2024 0925   PLT 320 05/31/2024 0925   MCV 88 05/31/2024 0925   MCH 28.7 05/31/2024 0925   MCH 28.8 04/01/2021 1756   MCHC  32.6 05/31/2024 0925   MCHC 33.8 04/01/2021 1756   RDW 13.5 05/31/2024 0925   Iron Studies No results found for: IRON, TIBC, FERRITIN, IRONPCTSAT Lipid Panel     Component Value Date/Time   CHOL 210 (H) 05/31/2024 0925   TRIG 148 05/31/2024 0925   HDL 49 05/31/2024 0925   CHOLHDL 4.3 05/31/2024 0925   CHOLHDL 5.2 (H) 07/11/2020 1708   VLDL 29 03/06/2013 0844   LDLCALC 135 (H) 05/31/2024 0925   LDLCALC 147 (H) 07/11/2020 1708   Hepatic Function Panel     Component Value Date/Time   PROT 6.9 02/22/2024 1229   ALBUMIN 4.4 02/22/2024 1229   AST 21 02/22/2024 1229   ALT 31 02/22/2024 1229   ALKPHOS 77 02/22/2024 1229   BILITOT 0.4 02/22/2024 1229      Component Value Date/Time   TSH 1.510 05/31/2024 0925   Nutritional Lab Results  Component Value Date   VD25OH 34.5 05/31/2024   VD25OH 54.4 05/06/2023   VD25OH 72.8 04/13/2022     ASSESSMENT AND PLAN  TREATMENT PLAN FOR OBESITY:  Recommended Dietary Goals  Sergio [name] is currently in the action stage of change. As such, his goal is to continue weight management plan. He has agreed to the Category 3 Plan.  Behavioral Intervention  We discussed the following Behavioral Modification Strategies today: increasing lean protein intake to established goals, decreasing simple carbohydrates , increasing vegetables, increasing fiber rich foods, increasing water  intake , reading food labels , keeping healthy foods at home, celebration eating strategies, continue to work on maintaining a reduced calorie state, getting the recommended amount of protein, incorporating whole foods, making healthy choices, staying well hydrated and practicing mindfulness when eating., and increase protein intake, fibrous foods (25 grams per day for women, 30 grams for men) and water  to improve satiety and decrease hunger signals. .  Additional resources provided today: NA  Recommended Physical Activity Goals  Sergio [name] has been advised to work up to  150 minutes of moderate intensity aerobic activity a week and strengthening exercises 2-3 times per week for cardiovascular health, weight loss maintenance and preservation of muscle mass.   He has agreed to Think about enjoyable ways to increase daily physical activity and overcoming barriers to exercise, Increase physical activity in their day and reduce sedentary time (increase NEAT)., Continue to gradually increase the amount and intensity of exercise routine, Increase volume of physical activity to a goal of 240 minutes a week, and Combine aerobic and strengthening exercises for efficiency and improved cardiometabolic health.   He's limited on exercising due to hip pain. Increase as tolerated  Pharmacotherapy We discussed various medication options to help Sergio [name] with his weight loss efforts and we  both agreed to continue Contrave  and increase to 2 am and 1 evening.  Side effects discussed.    ASSOCIATED CONDITIONS ADDRESSED TODAY  Action/Plan  Pre-diabetes -     Restart metFORMIN  HCl; Take 1 tablet (500 mg total) by mouth 2 (two) times daily with a meal.  Dispense: 60 tablet; Refill: 0.  Side effects discusssed   Insulin  resistance -     metFORMIN  HCl; Take 1 tablet (500 mg total) by mouth 2 (two) times daily with a meal.  Dispense: 60 tablet; Refill: 0  Generalized obesity  BMI 31.0-31.9,adult -     Contrave ; Take 2 tablets by mouth 2 (two) times daily.  Dispense: 120 tablet; Refill: 0       Options discussed today: Increasing Contrave  Start GLP 1-Wegovy vs Zepbound -will hold off at this time due to cost Re add Metformin     Return in about 4 weeks (around 09/20/2024).SABRA He was informed of the importance of frequent follow up visits to maximize his success with intensive lifestyle modifications for his multiple health conditions.   ATTESTASTION STATEMENTS:  Reviewed by clinician on day of visit: allergies, medications, problem list, medical history, surgical history, family  history, social history, and previous encounter notes.     Corean SAUNDERS. Okey Zelek FNP-C

## 2024-08-24 ENCOUNTER — Other Ambulatory Visit: Payer: Self-pay

## 2024-08-24 ENCOUNTER — Other Ambulatory Visit (HOSPITAL_COMMUNITY): Payer: Self-pay

## 2024-08-24 ENCOUNTER — Telehealth (HOSPITAL_COMMUNITY): Payer: Self-pay

## 2024-08-24 NOTE — Telephone Encounter (Signed)
 Pharmacy Patient Advocate Encounter   Received notification from Pt Calls Messages that prior authorization for Contrave  8-90MG  er tablets  is required/requested.   Insurance verification completed.   The patient is insured through [facility].   Per test claim: PA required; PA submitted to above mentioned insurance via Latent Key/confirmation #/EOC [id number] Status is pending

## 2024-08-24 NOTE — Telephone Encounter (Signed)
 PA request has been Received. New Encounter has been or will be created for follow up. For additional info see Pharmacy Prior Auth telephone encounter from 08/24/24.

## 2024-08-25 ENCOUNTER — Other Ambulatory Visit (HOSPITAL_COMMUNITY): Payer: Self-pay

## 2024-08-27 ENCOUNTER — Telehealth

## 2024-08-27 DIAGNOSIS — B9689 Other specified bacterial agents as the cause of diseases classified elsewhere: Secondary | ICD-10-CM

## 2024-08-27 DIAGNOSIS — J208 Acute bronchitis due to other specified organisms: Secondary | ICD-10-CM

## 2024-08-28 ENCOUNTER — Other Ambulatory Visit (HOSPITAL_COMMUNITY): Payer: Self-pay

## 2024-08-28 MED ORDER — PREDNISONE 20 MG PO TABS
40.0000 mg | ORAL_TABLET | Freq: Every day | ORAL | 0 refills | Status: AC
  Filled 2024-08-28: qty 10, 5d supply, fill #0

## 2024-08-28 MED ORDER — AZITHROMYCIN 250 MG PO TABS
ORAL_TABLET | ORAL | 0 refills | Status: AC
  Filled 2024-08-28: qty 6, 5d supply, fill #0

## 2024-08-28 MED ORDER — PROMETHAZINE-DM 6.25-15 MG/5ML PO SYRP
5.0000 mL | ORAL_SOLUTION | Freq: Four times a day (QID) | ORAL | 0 refills | Status: AC | PRN
  Filled 2024-08-28: qty 118, 6d supply, fill #0

## 2024-08-28 NOTE — Progress Notes (Signed)
 We are sorry that you are not feeling well.  Here is how we plan to help!  Based on your presentation I believe you most likely have A cough due to bacteria.  When patients have a fever and a productive cough with a change in color or increased sputum production, we are concerned about bacterial bronchitis.  If left untreated it can progress to pneumonia.  If your symptoms do not improve with your treatment plan it is important that you contact your provider.   I have prescribed Azithromyin 250 mg: two tablets now and then one tablet daily for 4 additonal days    In addition you may use a prescription cough syrup called Promethazine  DM Take 5mL every 6 hours as needed for cough.   I have also prescribed Prednisone  20mg  Take 2 tablets (40mg ) daily for 5 days.   From your responses in the eVisit questionnaire you describe inflammation in the upper respiratory tract which is causing a significant cough.  This is commonly called Bronchitis and has four common causes:   Allergies Viral Infections Acid Reflux Bacterial Infection Allergies, viruses and acid reflux are treated by controlling symptoms or eliminating the cause. An example might be a cough caused by taking certain blood pressure medications. You stop the cough by changing the medication. Another example might be a cough caused by acid reflux. Controlling the reflux helps control the cough.  USE OF BRONCHODILATOR (RESCUE) INHALERS: There is a risk from using your bronchodilator too frequently.  The risk is that over-reliance on a medication which only relaxes the muscles surrounding the breathing tubes can reduce the effectiveness of medications prescribed to reduce swelling and congestion of the tubes themselves.  Although you feel brief relief from the bronchodilator inhaler, your asthma may actually be worsening with the tubes becoming more swollen and filled with mucus.  This can delay other crucial treatments, such as oral steroid  medications. If you need to use a bronchodilator inhaler daily, several times per day, you should discuss this with your provider.  There are probably better treatments that could be used to keep your asthma under control.     HOME CARE Only take medications as instructed by your medical team. Complete the entire course of an antibiotic. Drink plenty of fluids and get plenty of rest. Avoid close contacts especially the very young and the elderly Cover your mouth if you cough or cough into your sleeve. Always remember to wash your hands A steam or ultrasonic humidifier can help congestion.   GET HELP RIGHT AWAY IF: You develop worsening fever. You become short of breath You cough up blood. Your symptoms persist after you have completed your treatment plan MAKE SURE YOU  Understand these instructions. Will watch your condition. Will get help right away if you are not doing well or get worse.  Your e-visit answers were reviewed by a board certified advanced clinical practitioner to complete your personal care plan.  Depending on the condition, your plan could have included both over the counter or prescription medications. If there is a problem please reply  once you have received a response from your provider. Your safety is important to us .  If you have drug allergies check your prescription carefully.    You can use MyChart to ask questions about todays visit, request a non-urgent call back, or ask for a work or school excuse for 24 hours related to this e-Visit. If it has been greater than 24 hours you will need  to follow up with your provider, or enter a new e-Visit to address those concerns. You will get an e-mail in the next two days asking about your experience.  I hope that your e-visit has been valuable and will speed your recovery. Thank you for using e-visits.   I have spent 5 minutes in review of e-visit questionnaire, review and updating patient chart, medical decision making  and response to patient.   Delon CHRISTELLA Dickinson, PA-C

## 2024-08-28 NOTE — Telephone Encounter (Signed)
 Pharmacy Patient Advocate Encounter  Received notification from [facility] that Prior Authorization for Contrave  8-90MG  er tablets  has been APPROVED from 08/28/24 to 12/26/24. Ran test claim, Copay is $24.97. This test claim was processed through [facility]- copay amounts may vary at other pharmacies due to pharmacy/plan contracts, or as the patient moves through the different stages of their insurance plan.   PA #/Case ID/Reference #: [id number]

## 2024-09-18 ENCOUNTER — Other Ambulatory Visit (HOSPITAL_COMMUNITY): Payer: Self-pay

## 2024-09-18 ENCOUNTER — Other Ambulatory Visit: Payer: Self-pay

## 2024-09-18 ENCOUNTER — Other Ambulatory Visit: Payer: Self-pay | Admitting: Internal Medicine

## 2024-09-18 ENCOUNTER — Other Ambulatory Visit: Payer: Self-pay | Admitting: Allergy & Immunology

## 2024-09-18 DIAGNOSIS — J302 Other seasonal allergic rhinitis: Secondary | ICD-10-CM

## 2024-09-18 DIAGNOSIS — J309 Allergic rhinitis, unspecified: Secondary | ICD-10-CM

## 2024-09-18 DIAGNOSIS — K219 Gastro-esophageal reflux disease without esophagitis: Secondary | ICD-10-CM

## 2024-09-18 MED ORDER — AZELASTINE HCL 137 MCG/SPRAY NA SOLN
NASAL | 0 refills | Status: AC
  Filled 2024-09-18: qty 30, 30d supply, fill #0

## 2024-09-18 MED ORDER — PANTOPRAZOLE SODIUM 40 MG PO TBEC
40.0000 mg | DELAYED_RELEASE_TABLET | Freq: Every day | ORAL | 1 refills | Status: AC
  Filled 2024-09-18: qty 90, 90d supply, fill #0

## 2024-10-11 ENCOUNTER — Ambulatory Visit: Admitting: Nurse Practitioner

## 2024-11-08 ENCOUNTER — Ambulatory Visit: Admitting: Nurse Practitioner

## 2024-11-29 ENCOUNTER — Ambulatory Visit: Admitting: Internal Medicine
